# Patient Record
Sex: Female | Born: 1985 | Race: White | Hispanic: No | Smoking: Current every day smoker
Health system: Southern US, Community
[De-identification: ages and names within clinical notes are randomized; demographics above are authoritative.]

## PROBLEM LIST (undated history)

## (undated) DIAGNOSIS — J45909 Unspecified asthma, uncomplicated: Secondary | ICD-10-CM

## (undated) DIAGNOSIS — I2699 Other pulmonary embolism without acute cor pulmonale: Secondary | ICD-10-CM

## (undated) DIAGNOSIS — Q2112 Patent foramen ovale: Secondary | ICD-10-CM

## (undated) DIAGNOSIS — F419 Anxiety disorder, unspecified: Secondary | ICD-10-CM

## (undated) DIAGNOSIS — D649 Anemia, unspecified: Secondary | ICD-10-CM

## (undated) DIAGNOSIS — I82403 Acute embolism and thrombosis of unspecified deep veins of lower extremity, bilateral: Secondary | ICD-10-CM

## (undated) DIAGNOSIS — D6859 Other primary thrombophilia: Secondary | ICD-10-CM

## (undated) HISTORY — PX: PATENT FORAMEN OVALE(PFO) CLOSURE: CATH118300

## (undated) HISTORY — DX: Acute embolism and thrombosis of unspecified deep veins of lower extremity, bilateral: I82.403

## (undated) NOTE — Addendum Note (Signed)
 Formatting of this note is different from the original. Addendum  created 03/09/24 0835 by Ozell KATHEE Naegeli, MD   Clinical Note Signed   Electronically signed by Ozell KATHEE Naegeli, MD at 03/09/2024  8:35 EDT

## (undated) NOTE — Anesthesia Postprocedure Evaluation (Signed)
 Formatting of this note is different from the original. Patient: Amber Carroll Procedures performed: Procedure(s): VENOGRAM/VENOPLASTY/VENUS STENT AND REVCORE THROMBECTOMY  Assessment: No complications from anesthesia  Last vitals:  Vitals Value Taken Time  BP 109/55 01/21/24 19:01  Temp x 01/23/24 08:18  Pulse 62 01/21/24 19:03  Resp 36 01/21/24 19:03  SpO2 100 % 01/21/24 19:02   Post vital signs: stable  Respiratory: unassisted Airway patency: patent Cardiovascular: stable and blood pressure at baseline Mental Status: Awake, Responds verbally, Answers questions appropriately, and Performs simple tasks  Post-anesthesia pain: adequate analgesia  Pain Management Approach: Multimodal analgesia pain management approach  Hydration: euvolemic  Anesthetic complications: no  Nausea: No  Vomiting: No   Electronically signed by Prentice GORMAN Fare, MD at 01/23/2024  8:18 EDT

## (undated) NOTE — Anesthesia Postprocedure Evaluation (Signed)
 Formatting of this note is different from the original. Patient: Amber Carroll Procedures performed: Procedure(s): VENOGRAM/VENOPLASTY/VENUS STENT AND REVCORE THROMBECTOMY  Assessment: No complications from anesthesia  Last vitals:  Vitals Value Taken Time  BP 109/55 01/21/24 19:01  Temp 98.5 03/09/24 08:34  Pulse 62 01/21/24 19:03  Resp 36 01/21/24 19:03  SpO2 100 % 01/21/24 19:02   Post vital signs: stable  Respiratory: unassisted Airway patency: patent Cardiovascular: stable and blood pressure at baseline Mental Status: Awake, Responds verbally, Answers questions appropriately, and Performs simple tasks  Post-anesthesia pain: adequate analgesia  Pain Management Approach: Multimodal analgesia pain management approach  Hydration: euvolemic  Anesthetic complications: no  Nausea: No  Vomiting: No   Electronically signed by Ozell KATHEE Naegeli, MD at 03/09/2024  8:35 EDT

## (undated) NOTE — Anesthesia Preprocedure Evaluation (Signed)
 Formatting of this note is different from the original.  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 Use of blood products discussed with who consented to blood products.    Preoperative Evaluation Electronically Signed and Performed By: Juliene Blossom, MD, 05/18/2024 9:27    Electronically signed by Juliene Blossom, MD at 05/18/2024  9:27 EST

## (undated) NOTE — Anesthesia Preprocedure Evaluation (Signed)
 Formatting of this note is different from the original.  Anesthesia Evaluation   Patient summary reviewed and Nursing notes reviewed   No history of anesthetic complications  Airway  No increased risk of difficult airway Mallampati: II TM distance: >3 FB Neck ROM: full   Dental - normal exam    Pulmonary - normal exam   Cardiovascular - normal exam   Neuro/Psych      GI/Hepatic/Renal     Endo/Other    Abdominal       Anesthesia Plan  ASA 3   general   intravenous induction  Anesthetic plan and risks discussed with patient. Use of blood products discussed with patient who.    Preoperative Evaluation Electronically Signed and Performed By: Tommi Blanch, MD, 01/21/2024 16:06    Electronically signed by Tommi Blanch GAILS, MD at 01/21/2024 16:06 EDT

---

## 2008-04-19 ENCOUNTER — Emergency Department (HOSPITAL_BASED_OUTPATIENT_CLINIC_OR_DEPARTMENT_OTHER): Admission: EM | Admit: 2008-04-19 | Discharge: 2008-04-19 | Payer: Self-pay | Admitting: Emergency Medicine

## 2008-04-20 ENCOUNTER — Ambulatory Visit (HOSPITAL_BASED_OUTPATIENT_CLINIC_OR_DEPARTMENT_OTHER): Admission: RE | Admit: 2008-04-20 | Discharge: 2008-04-20 | Payer: Self-pay | Admitting: Emergency Medicine

## 2008-04-21 ENCOUNTER — Ambulatory Visit: Payer: Self-pay | Admitting: *Deleted

## 2008-04-21 ENCOUNTER — Encounter (INDEPENDENT_AMBULATORY_CARE_PROVIDER_SITE_OTHER): Payer: Self-pay | Admitting: *Deleted

## 2008-04-21 ENCOUNTER — Other Ambulatory Visit: Admission: RE | Admit: 2008-04-21 | Discharge: 2008-04-21 | Payer: Self-pay | Admitting: *Deleted

## 2008-04-21 ENCOUNTER — Ambulatory Visit (HOSPITAL_BASED_OUTPATIENT_CLINIC_OR_DEPARTMENT_OTHER): Admission: RE | Admit: 2008-04-21 | Discharge: 2008-04-21 | Payer: Self-pay | Admitting: *Deleted

## 2008-04-21 DIAGNOSIS — G43009 Migraine without aura, not intractable, without status migrainosus: Secondary | ICD-10-CM | POA: Insufficient documentation

## 2008-04-21 DIAGNOSIS — F329 Major depressive disorder, single episode, unspecified: Secondary | ICD-10-CM | POA: Insufficient documentation

## 2008-04-21 DIAGNOSIS — R109 Unspecified abdominal pain: Secondary | ICD-10-CM | POA: Insufficient documentation

## 2008-04-21 DIAGNOSIS — K219 Gastro-esophageal reflux disease without esophagitis: Secondary | ICD-10-CM | POA: Insufficient documentation

## 2008-04-21 DIAGNOSIS — J45909 Unspecified asthma, uncomplicated: Secondary | ICD-10-CM | POA: Insufficient documentation

## 2008-04-23 ENCOUNTER — Ambulatory Visit: Payer: Self-pay | Admitting: *Deleted

## 2008-04-23 LAB — CONVERTED CEMR LAB
Hep A IgM: NEGATIVE
Hepatitis B Surface Ag: NEGATIVE

## 2008-09-09 ENCOUNTER — Ambulatory Visit: Payer: Self-pay | Admitting: *Deleted

## 2008-09-09 DIAGNOSIS — J309 Allergic rhinitis, unspecified: Secondary | ICD-10-CM | POA: Insufficient documentation

## 2008-10-08 ENCOUNTER — Ambulatory Visit: Payer: Self-pay | Admitting: *Deleted

## 2008-10-08 DIAGNOSIS — N926 Irregular menstruation, unspecified: Secondary | ICD-10-CM | POA: Insufficient documentation

## 2009-01-30 DIAGNOSIS — I2699 Other pulmonary embolism without acute cor pulmonale: Secondary | ICD-10-CM

## 2009-01-30 HISTORY — DX: Other pulmonary embolism without acute cor pulmonale: I26.99

## 2009-02-03 ENCOUNTER — Telehealth: Payer: Self-pay | Admitting: Family Medicine

## 2009-02-07 ENCOUNTER — Ambulatory Visit: Payer: Self-pay | Admitting: Internal Medicine

## 2009-02-07 ENCOUNTER — Emergency Department (HOSPITAL_COMMUNITY): Admission: EM | Admit: 2009-02-07 | Discharge: 2009-02-07 | Payer: Self-pay | Admitting: Family Medicine

## 2009-02-07 DIAGNOSIS — Z86718 Personal history of other venous thrombosis and embolism: Secondary | ICD-10-CM | POA: Insufficient documentation

## 2009-02-08 ENCOUNTER — Ambulatory Visit: Payer: Self-pay | Admitting: *Deleted

## 2009-02-08 ENCOUNTER — Inpatient Hospital Stay (HOSPITAL_COMMUNITY): Admission: EM | Admit: 2009-02-08 | Discharge: 2009-02-10 | Payer: Self-pay | Admitting: Emergency Medicine

## 2009-02-08 ENCOUNTER — Encounter (INDEPENDENT_AMBULATORY_CARE_PROVIDER_SITE_OTHER): Payer: Self-pay | Admitting: Emergency Medicine

## 2009-02-11 ENCOUNTER — Ambulatory Visit: Payer: Self-pay | Admitting: Oncology

## 2009-02-11 ENCOUNTER — Ambulatory Visit: Payer: Self-pay | Admitting: Cardiology

## 2009-02-11 DIAGNOSIS — I2699 Other pulmonary embolism without acute cor pulmonale: Secondary | ICD-10-CM | POA: Insufficient documentation

## 2009-02-11 LAB — CONVERTED CEMR LAB
INR: 8.8 (ref 0.8–1.0)
POC INR: 8.8
Prothrombin Time: 90 s

## 2009-02-14 ENCOUNTER — Ambulatory Visit: Payer: Self-pay | Admitting: Cardiovascular Disease

## 2009-02-14 ENCOUNTER — Encounter: Payer: Self-pay | Admitting: Cardiovascular Disease

## 2009-02-14 ENCOUNTER — Telehealth (INDEPENDENT_AMBULATORY_CARE_PROVIDER_SITE_OTHER): Payer: Self-pay | Admitting: *Deleted

## 2009-02-14 LAB — CONVERTED CEMR LAB
INR: 2 — ABNORMAL HIGH (ref 0.8–1.0)
Prothrombin Time: 20.8 s

## 2009-02-16 ENCOUNTER — Telehealth (INDEPENDENT_AMBULATORY_CARE_PROVIDER_SITE_OTHER): Payer: Self-pay | Admitting: *Deleted

## 2009-02-18 ENCOUNTER — Encounter: Payer: Self-pay | Admitting: Cardiology

## 2009-02-18 ENCOUNTER — Ambulatory Visit: Payer: Self-pay | Admitting: Internal Medicine

## 2009-02-18 LAB — CONVERTED CEMR LAB
INR: 2.1 — ABNORMAL HIGH (ref 0.8–1.0)
Prothrombin Time: 21.2 s

## 2009-02-23 ENCOUNTER — Ambulatory Visit: Payer: Self-pay | Admitting: Cardiovascular Disease

## 2009-02-23 ENCOUNTER — Encounter: Payer: Self-pay | Admitting: Internal Medicine

## 2009-02-23 LAB — CBC & DIFF AND RETIC
BASO%: 1.7 % (ref 0.0–2.0)
Basophils Absolute: 0.1 10*3/uL (ref 0.0–0.1)
HCT: 38.4 % (ref 34.8–46.6)
HGB: 12.3 g/dL (ref 11.6–15.9)
Immature Retic Fract: 2.9 % (ref 0.00–10.70)
MONO#: 0.3 10*3/uL (ref 0.1–0.9)
NEUT#: 2.9 10*3/uL (ref 1.5–6.5)
NEUT%: 56.5 % (ref 38.4–76.8)
WBC: 5.2 10*3/uL (ref 3.9–10.3)
lymph#: 1.7 10*3/uL (ref 0.9–3.3)

## 2009-02-23 LAB — FACTOR 8 ASSAY: Coagulation Factor VIII: 199 % — ABNORMAL HIGH (ref 73–140)

## 2009-02-23 LAB — PROTIME-INR

## 2009-02-23 LAB — CONVERTED CEMR LAB: POC INR: 1.5

## 2009-02-24 ENCOUNTER — Telehealth: Payer: Self-pay | Admitting: Internal Medicine

## 2009-02-28 ENCOUNTER — Ambulatory Visit: Payer: Self-pay | Admitting: Cardiology

## 2009-02-28 LAB — CONVERTED CEMR LAB: POC INR: 1.9

## 2009-03-02 ENCOUNTER — Ambulatory Visit: Payer: Self-pay | Admitting: Cardiology

## 2009-03-02 ENCOUNTER — Encounter: Payer: Self-pay | Admitting: Pharmacist

## 2009-03-04 ENCOUNTER — Ambulatory Visit: Payer: Self-pay | Admitting: Internal Medicine

## 2009-03-09 ENCOUNTER — Ambulatory Visit: Payer: Self-pay | Admitting: Internal Medicine

## 2009-03-09 LAB — CONVERTED CEMR LAB: POC INR: 2.7

## 2009-03-16 ENCOUNTER — Ambulatory Visit: Payer: Self-pay | Admitting: Cardiology

## 2009-03-16 LAB — CONVERTED CEMR LAB: POC INR: 2.1

## 2009-03-23 ENCOUNTER — Ambulatory Visit: Payer: Self-pay | Admitting: Vascular Surgery

## 2009-03-23 ENCOUNTER — Emergency Department (HOSPITAL_COMMUNITY): Admission: EM | Admit: 2009-03-23 | Discharge: 2009-03-23 | Payer: Self-pay | Admitting: Emergency Medicine

## 2009-03-23 ENCOUNTER — Ambulatory Visit: Payer: Self-pay | Admitting: Cardiovascular Disease

## 2009-03-23 LAB — CONVERTED CEMR LAB: POC INR: 1.4

## 2009-03-24 ENCOUNTER — Ambulatory Visit (HOSPITAL_COMMUNITY): Admission: RE | Admit: 2009-03-24 | Discharge: 2009-03-24 | Payer: Self-pay | Admitting: Emergency Medicine

## 2009-03-24 ENCOUNTER — Encounter (INDEPENDENT_AMBULATORY_CARE_PROVIDER_SITE_OTHER): Payer: Self-pay | Admitting: Emergency Medicine

## 2009-03-28 ENCOUNTER — Emergency Department (HOSPITAL_COMMUNITY): Admission: EM | Admit: 2009-03-28 | Discharge: 2009-03-28 | Payer: Self-pay | Admitting: Emergency Medicine

## 2009-03-28 ENCOUNTER — Ambulatory Visit: Payer: Self-pay | Admitting: Cardiology

## 2009-04-04 ENCOUNTER — Ambulatory Visit: Payer: Self-pay | Admitting: Internal Medicine

## 2009-04-06 ENCOUNTER — Ambulatory Visit: Payer: Self-pay | Admitting: Internal Medicine

## 2009-04-06 DIAGNOSIS — I2699 Other pulmonary embolism without acute cor pulmonale: Secondary | ICD-10-CM | POA: Insufficient documentation

## 2009-04-06 DIAGNOSIS — R0602 Shortness of breath: Secondary | ICD-10-CM | POA: Insufficient documentation

## 2009-04-12 ENCOUNTER — Ambulatory Visit: Payer: Self-pay | Admitting: Cardiology

## 2009-04-22 ENCOUNTER — Ambulatory Visit: Payer: Self-pay | Admitting: Cardiology

## 2009-04-22 LAB — CONVERTED CEMR LAB: POC INR: 2.4

## 2009-05-06 ENCOUNTER — Ambulatory Visit: Payer: Self-pay | Admitting: Cardiovascular Disease

## 2009-05-06 LAB — CONVERTED CEMR LAB: POC INR: 4.5

## 2009-05-13 ENCOUNTER — Ambulatory Visit: Payer: Self-pay | Admitting: Oncology

## 2009-05-17 ENCOUNTER — Encounter: Payer: Self-pay | Admitting: Internal Medicine

## 2009-05-17 ENCOUNTER — Encounter (INDEPENDENT_AMBULATORY_CARE_PROVIDER_SITE_OTHER): Payer: Self-pay | Admitting: Pharmacist

## 2009-05-17 ENCOUNTER — Encounter: Payer: Self-pay | Admitting: Cardiology

## 2009-05-17 LAB — CBC WITH DIFFERENTIAL/PLATELET
BASO%: 0.4 % (ref 0.0–2.0)
Basophils Absolute: 0 10*3/uL (ref 0.0–0.1)
EOS%: 2.3 % (ref 0.0–7.0)
HGB: 11.3 g/dL — ABNORMAL LOW (ref 11.6–15.9)
MCH: 28.5 pg (ref 25.1–34.0)
MCHC: 32.4 g/dL (ref 31.5–36.0)
MCV: 87.9 fL (ref 79.5–101.0)
MONO%: 7 % (ref 0.0–14.0)
NEUT%: 60.4 % (ref 38.4–76.8)
RDW: 15.1 % — ABNORMAL HIGH (ref 11.2–14.5)

## 2009-05-17 LAB — PROTIME-INR
INR: 1.6 — ABNORMAL LOW (ref 2.00–3.50)
Protime: 19.2 Seconds — ABNORMAL HIGH (ref 10.6–13.4)

## 2009-05-31 ENCOUNTER — Emergency Department (HOSPITAL_COMMUNITY): Admission: EM | Admit: 2009-05-31 | Discharge: 2009-06-01 | Payer: Self-pay | Admitting: Emergency Medicine

## 2009-05-31 ENCOUNTER — Emergency Department (HOSPITAL_COMMUNITY): Admission: EM | Admit: 2009-05-31 | Discharge: 2009-05-31 | Payer: Self-pay | Admitting: Emergency Medicine

## 2009-06-17 ENCOUNTER — Encounter (INDEPENDENT_AMBULATORY_CARE_PROVIDER_SITE_OTHER): Payer: Self-pay | Admitting: Cardiology

## 2009-07-14 ENCOUNTER — Telehealth: Payer: Self-pay | Admitting: Internal Medicine

## 2009-07-28 ENCOUNTER — Encounter (INDEPENDENT_AMBULATORY_CARE_PROVIDER_SITE_OTHER): Payer: Self-pay | Admitting: Cardiology

## 2009-08-05 ENCOUNTER — Ambulatory Visit: Payer: Self-pay | Admitting: Internal Medicine

## 2009-08-05 DIAGNOSIS — F17200 Nicotine dependence, unspecified, uncomplicated: Secondary | ICD-10-CM | POA: Insufficient documentation

## 2009-08-24 ENCOUNTER — Encounter: Payer: Self-pay | Admitting: Internal Medicine

## 2009-08-24 ENCOUNTER — Ambulatory Visit: Payer: Self-pay | Admitting: Oncology

## 2009-08-26 ENCOUNTER — Encounter: Payer: Self-pay | Admitting: Internal Medicine

## 2009-08-26 LAB — CBC WITH DIFFERENTIAL/PLATELET
Basophils Absolute: 0 10*3/uL (ref 0.0–0.1)
Eosinophils Absolute: 0.2 10*3/uL (ref 0.0–0.5)
HGB: 12.5 g/dL (ref 11.6–15.9)
LYMPH%: 38.9 % (ref 14.0–49.7)
MCV: 88.8 fL (ref 79.5–101.0)
MONO#: 0.3 10*3/uL (ref 0.1–0.9)
MONO%: 6.4 % (ref 0.0–14.0)
NEUT#: 2 10*3/uL (ref 1.5–6.5)
Platelets: 184 10*3/uL (ref 145–400)
WBC: 3.9 10*3/uL (ref 3.9–10.3)

## 2009-09-01 LAB — PROTEIN S ACTIVITY: Protein S Activity: 82 % (ref 69–129)

## 2009-09-01 LAB — PROTEIN S, ANTIGEN, FREE: Protein S Ag, Free: 75 % normal (ref 50–147)

## 2009-09-01 LAB — D-DIMER, QUANTITATIVE: D-Dimer, Quant: 0.26 ug/mL-FEU (ref 0.00–0.48)

## 2010-01-19 IMAGING — CR DG CHEST 2V
2 series · 2 of 2 positions shown · non-contrast
Comparison: 02/09/2009 and earlier.

CLINICAL DATA: 23-year-old female with shortness of breath and
pleuritic chest pain. History of pulmonary embolus and an January 2009.

CHEST - 2 VIEW

[w chest pa]
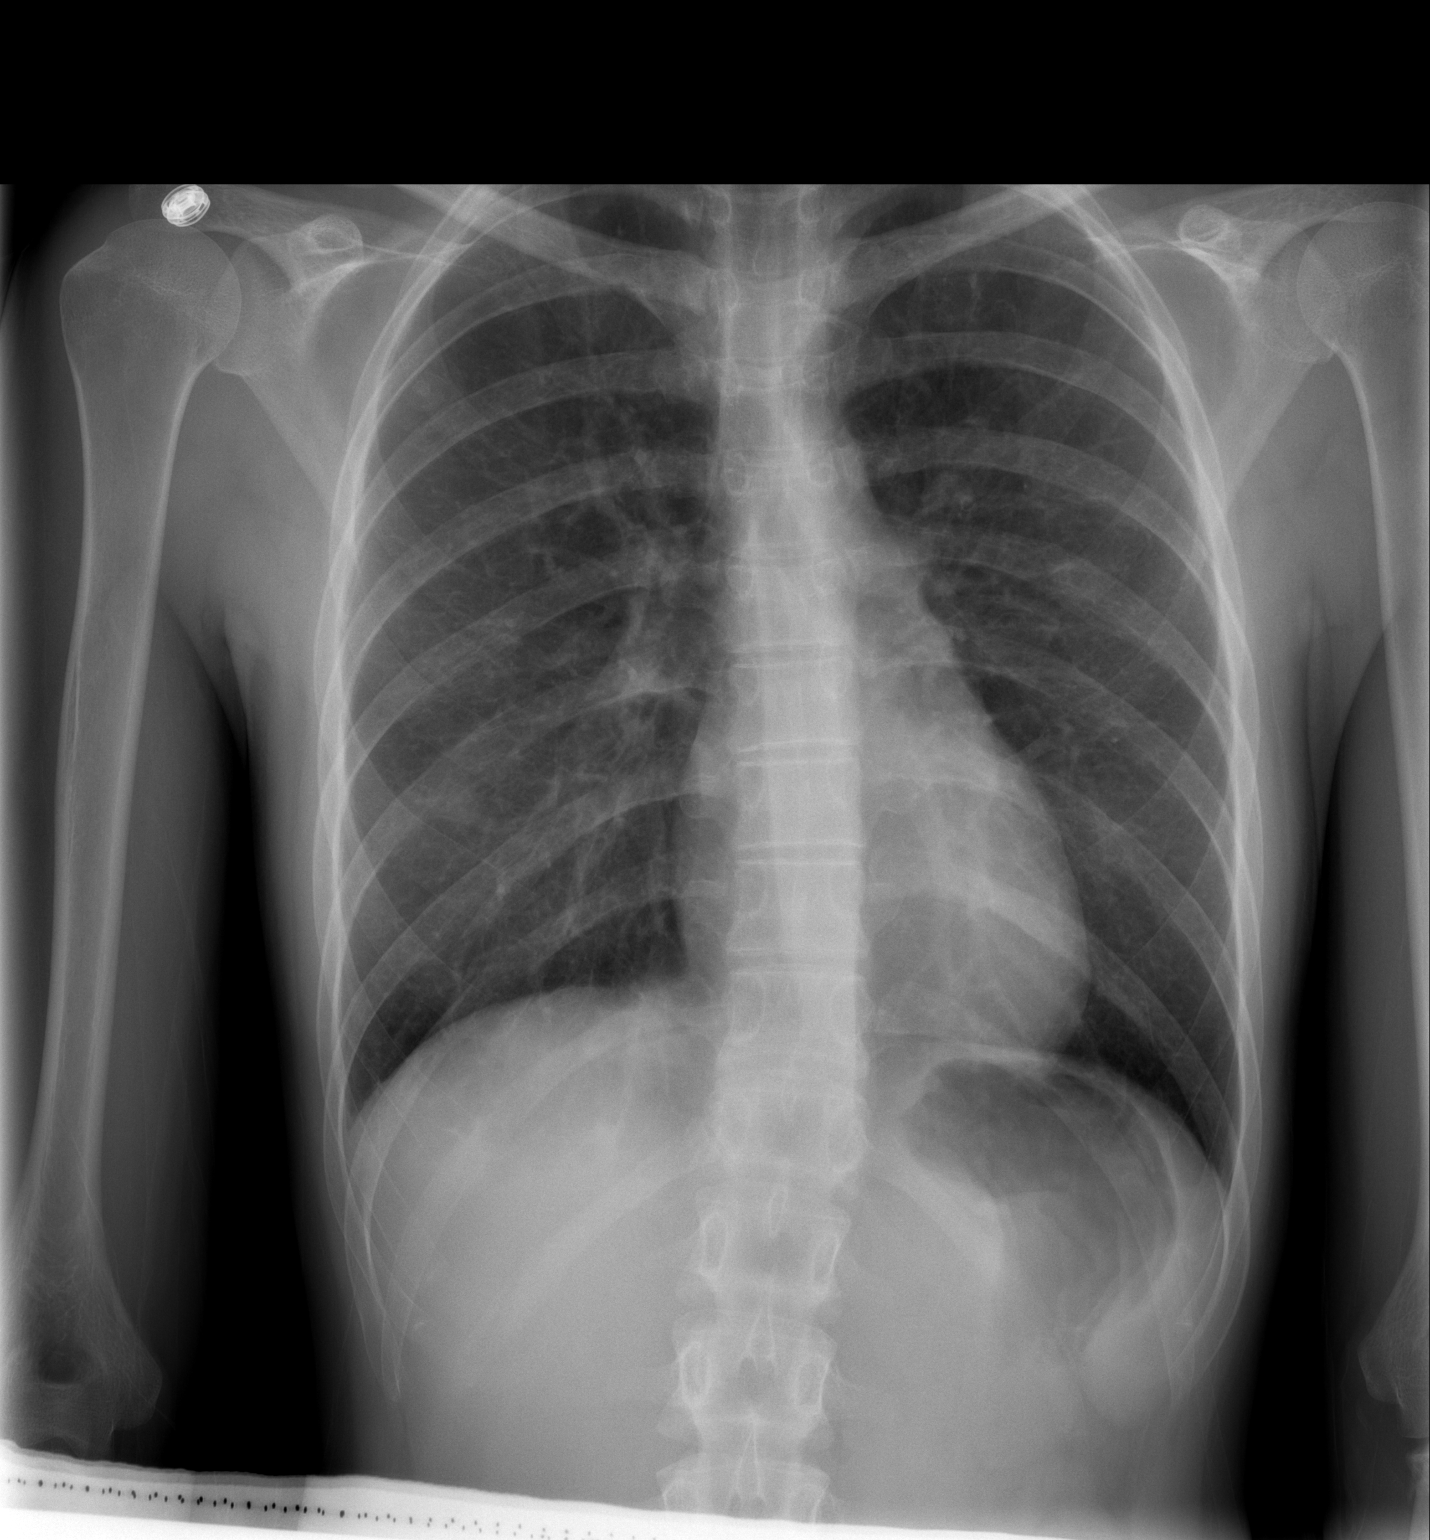

[w chest lat]
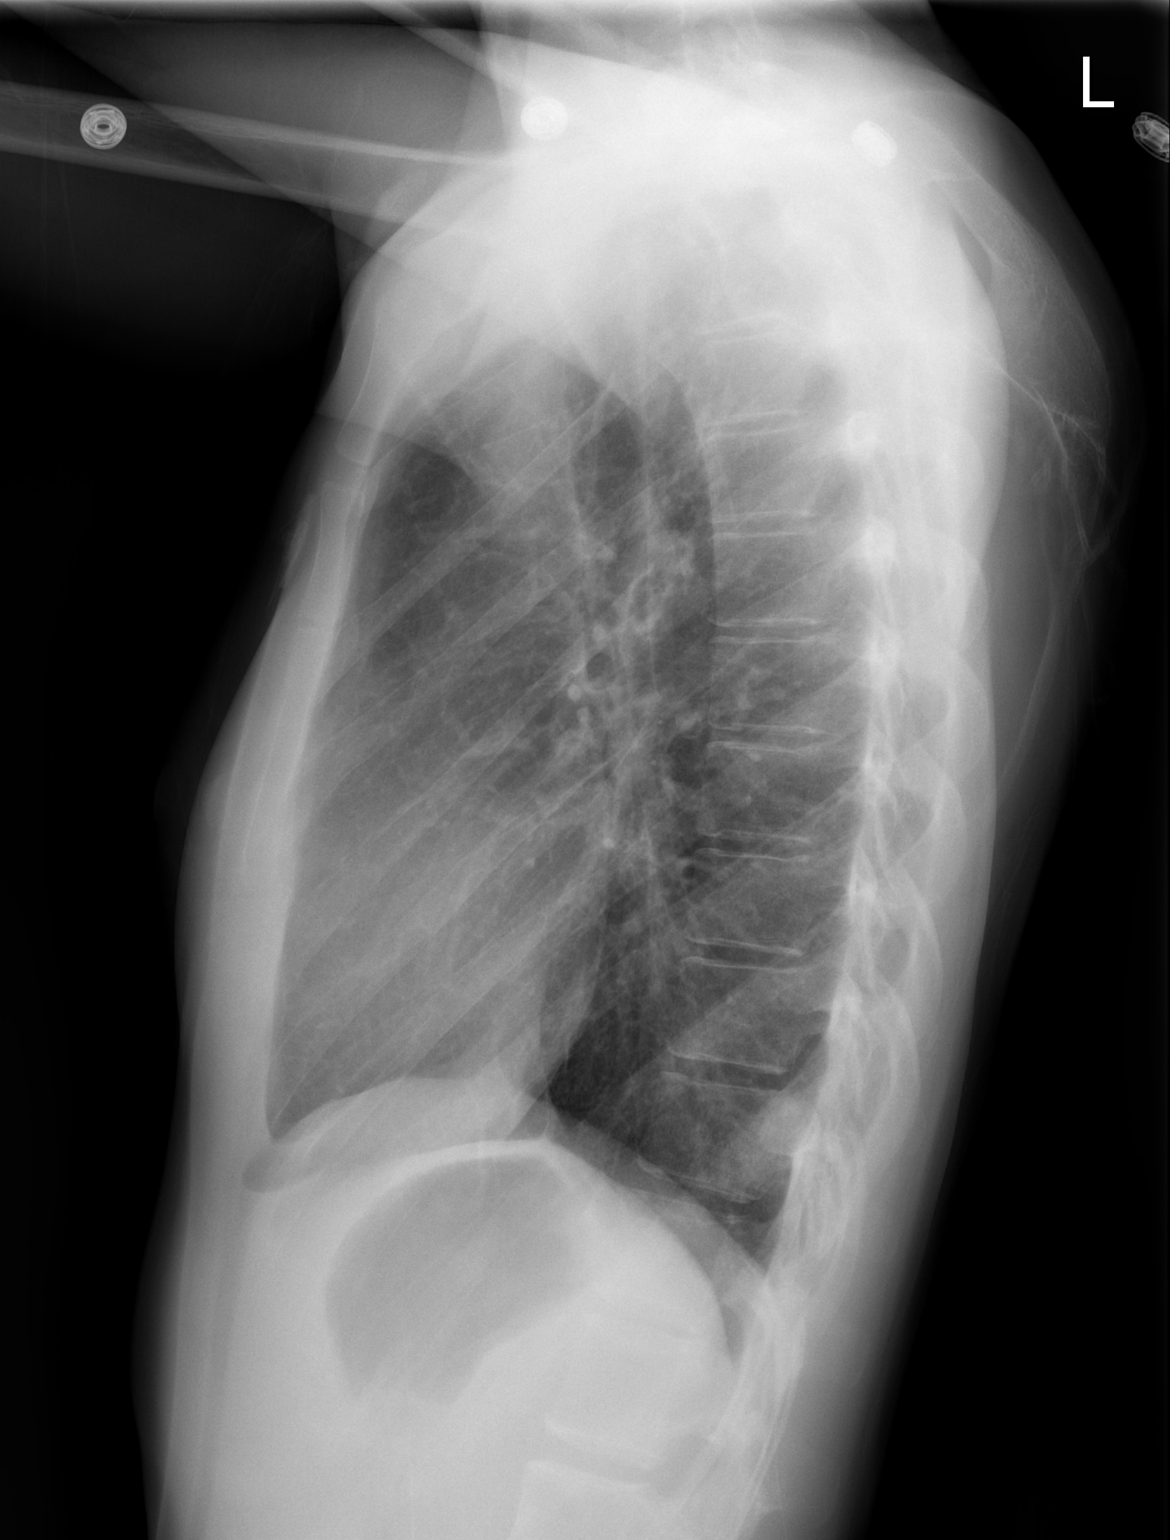

[2 of 2 positions shown; findings below may reference images not displayed]

FINDINGS: Better lung volumes and interval improved ventilation at
both lung bases.  Stable cardiac size and mediastinal contours.  No
pneumothorax or pleural effusion.  No pulmonary edema. Stable
visualized osseous structures.  Visualized tracheal air column is
within normal limits.
IMPRESSION: Improved ventilation at the lung bases.  No acute cardiopulmonary
abnormality by plain radiography. History of recent pulmonary
embolus noted, chest CTA is pending at this time.

## 2010-08-01 NOTE — Assessment & Plan Note (Signed)
Summary: rov/ mbw   Visit Type:  Follow-up Primary Provider/Referring Provider:  None  CC:  Pt here to discuss stopping coumadin. 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 Course was complicated by rt pleuritic pain due to infarct and anemia nos (hg 8-9gm%). She now presents for followup. After discharge she had 1 visit to [facility] on day of discharge for severe Rt infrascapular pleuritic pain. She was discharged with NSAIDs with the opinion this pain was from the infarct. Since then pain improved. However, repprested to [place] on 03/23/2009 with Lower extremitiy pain. Doppler negative for PE and discharged. RE-presented again 03/28/2009 with rt back pain. Had CT angiogram again on 03/28/2009. I personally reviewed this and compared to CT 02/07/2009. THe current CT shows that PE has improved but still has residual peripheral clot and Rt LL infarct findings. Pain was deemed to UTI and she was given cipro and discharged. With cipro pain has improved significantly.  However, she still has some Rt sided chest tightness occassionally with some rt shoulder pain. Also, activities like climibing stairs produce mild dyspnea. She is not smoking anymore and now states that she quit smoking years before PE ( a variation in her hx compared to admission when she told me that she continued to smoke occassionally). She denies illicit drug abuse. She is not using contraception. Admist to sexual activity - ? protected, ? monogamous. She is wanting to know what implications are there for her if she were to get pregnant. She still commutes to [place] every month or so to see her daughter on the weekends.  In terms Of PE, she saw Dr. Sheldon Silvan and will see him again in few months. She understands need for 1-4 vs lifelong anticoagulation. She is compliant with coumadin clinic but INR has  fluctuated 1.7-.2.7 per her report.   REC: Continued coumadinOV 08/05/2009. Followup PE. Since last visit she discontinued coumadin. She no showed at coumadin clinic and has refused to take coumadin. We contacted her and she cancelled and finally showed up today. SHe is feeling well. Denies complaints. Denies dyspnea, chest pain, syncope, hemoptysis, paroxysmal nocturnal dyspnea, fever, chills, dyspnea etc., She is refusing to take coumadin. Her rationale is that she wants to be tested for Protein S deficiency off coumadin and if that is present, then she will take it life long. Otherwise, she sees no point taking it. Continues to smoke. REfuses to quit. Says it helps stress  Current Medications (verified): 1)  Ventolin Hfa 108 (90 Base) Mcg/act Aers (Albuterol Sulfate) .Marland Kitchen.. 1-2 Puffs Every 3-4 Hours As Needed Shortness of Breath  Allergies (verified): 1)  ! * Latex  Past History:  Past Medical History: Last updated: 04/06/2009 [facility] EMBOLISM WITHOUT DVT - 02/07/2009 > CT 02/07/09 large occlusive pulmonary embolus extending from the right main PA primarily into branches of the pulmonary artery to the  right lower lobe and right middle lobe with atelectasis/ developing  infarction particularly  right lower lobe >SEVERITY: PESI SCORE 42/Class 1, needed 2L O2. SBP 90-100 not tpa'ed this was baseline >etiology: smoking, fam hx, nuva ring, recent knee strain, travel to [place] via care >hyperocoag panel 02/08/09 #Reported asthma, details are completely unknown. Uses only      albuterol as needed # Strained right knee towards the end of June, early July, 2010 no  specific treatment.  #Status post placement of NuvaRing in June, early July 2010  #Teeth work done 2 weeks prior to PE  admission in August 2010 #Hx of chlamydia, gonorrhea - date unspecified #Pyelonephritis in the 2008.  #Baseline low bloodpressure 80-90/60 per her dad. This is because of      her thin status  #Thin status - denuies  anorexia or bulemia  #Depression - post partum in 2008, & depression in 2004 used meds for one year, not an issue at present #Migraine - tylenol or ibuprofen relieves the headaches #GERD  Past Surgical History: Last updated: 04/21/2008 Denies surgical history  Family History: Last updated: 04/06/2009 Family History of Alcoholism/Addiction Family History Breast cancer  Family History of Stroke    Positive for deep vein thrombosis.  Her father has a   factor V Leiden deficiency.  Her sister suffered a right lower extremity  DVT and inferior vena cava occlusion at age 81.  The sister is currently  age 24.  She did not have a PE at that time.  The father was tested and  the sister both were confirmed to have factor V Leiden deficiency.  The  patient herself and her other younger sibling were also tested and did not show to have factor V Leiden deficiency.  Although her mom had a  massive stroke at age 72 and I think is disabled currently.  UPDATE  04/06/2009: Dad Protein S - normal. Mom getting tested for it  Social History: Last updated: 04/06/2009 She is a nondrinker.  She used to live in [place], [place], but she moved with her family to [place].   She lives with her dad and step mom.  Her biological mom suffered a   massive stroke and lives in [place].  There is no history of any specific drug abuse    although medicines in august 2010 do list some Darvocet and Naprosyn use at home , I am   unclear why and for how long.  She is engaged in a custody battle with  her ex-husband or boyfriend.  There have a 71-1/2-year-old daughter who  is currently in [place] with the biological dad  Denies smoking history but in hospital 8/10 PE admission she admitted to smoking. Was very difficult to get smoking details. Was underplaying smoking hx  Risk Factors: Caffeine Use: 4 - recommended cutting back (04/21/2008) Exercise: yes (04/21/2008)  Risk Factors: Smoking Status:  never (04/21/2008) Passive Smoke Exposure: no (04/21/2008)  Family History: Reviewed history from 04/06/2009 and no changes required. Family History of Alcoholism/Addiction Family History Breast cancer  Family History of Stroke    Positive for deep vein thrombosis.  Her father has a   factor V Leiden deficiency.  Her sister suffered a right lower extremity  DVT and inferior vena cava occlusion at age 54.  The sister is currently  age 33.  She did not have a PE at that time.  The father was tested and  the sister both were confirmed to have factor V Leiden deficiency.  The  patient herself and her other younger sibling were also tested and did not show to have factor V Leiden deficiency.  Although her mom had a  massive stroke at age 16 and I think is disabled currently.  UPDATE  04/06/2009: Dad Protein S - normal. Mom getting tested for it  Social History: Reviewed history from 04/06/2009 and no changes required. She is a nondrinker.  She used to live  in [place], [place], but she moved with her family to [place].   She lives with her dad and step mom.  Her biological mom suffered a   massive stroke and lives in [place].  There is no history of any specific drug abuse    although medicines in august 2010 do list some Darvocet and Naprosyn use at home , I am   unclear why and for how long.  She is engaged in a custody battle with  her ex-husband or boyfriend.  There have a 41-1/2-year-old daughter who  is currently in [place] with the biological dad  Denies smoking history but in hospital 8/10 PE admission she admitted to smoking. Was very difficult to get smoking details. Was underplaying smoking hx  Review of Systems  The patient denies anorexia, fever, weight loss, weight gain, vision loss, decreased hearing, hoarseness, chest pain, syncope, dyspnea on exertion, peripheral edema, prolonged cough, headaches, hemoptysis, abdominal pain, melena, hematochezia, severe  indigestion/heartburn, hematuria, incontinence, genital sores, muscle weakness, suspicious skin lesions, transient blindness, difficulty walking, depression, unusual weight change, abnormal bleeding, enlarged lymph nodes, angioedema, breast masses, and testicular masses.    Vital Signs:  Patient profile:   25 year old female Menstrual status:  irregular Height:      63 inches Weight:      95 pounds BMI:     16.89 O2 Sat:      97 % on Room air Temp:     97.9 degrees F oral Pulse rate:   57 / minute BP sitting:   100 / 68  (right arm) Cuff size:   regular  Vitals Entered By: Carron Curie CMA (August 05, 2009 1:37 PM)  O2 Flow:  Room air CC: Pt here to discuss stopping coumadin.  Comments Medications reviewed with patient Carron Curie CMA  August 05, 2009 1:40 PM Daytime phone number verified with patient.    Physical Exam  General:  well developed, well nourished, in no acute distress. thin.   Head:  normocephalic and atraumatic. Mild alpeica Eyes:  PERRLA/EOM intact; conjunctiva and sclera clear Ears:  TMs intact and clear with normal canals Nose:  no deformity, discharge, inflammation, or lesions Mouth:  no deformity or lesions Neck:  no masses, thyromegaly, or abnormal cervical nodes Chest Wall:  no deformities noted Lungs:  clear bilaterally to auscultation and percussion Heart:  regular rate and rhythm, S1, S2 without murmurs, rubs, gallops, or clicks Abdomen:  bowel sounds positive; abdomen soft and non-tender without masses, or organomegaly Msk:  no deformity or scoliosis noted with normal posture Pulses:  pulses normal Extremities:  no clubbing, cyanosis, edema, or deformity noted Neurologic:  CN II-XII grossly intact with normal reflexes, coordination, muscle strength and tone Skin:  intact without lesions or rashes Cervical Nodes:  no significant adenopathy Axillary Nodes:  no significant adenopathy Psych:  alert and cooperative; normal mood and  affect; normal attention span and concentration   Impression & Recommendations:  Problem # 1:  PULMONARY EMBOLISM (ICD-415.1) Assessment Unchanged She is refusing coumadin. Has not taken it past 2-3 months. I explained to her even in idiopathic PE the rationale to take coumadin for 6-12 months is to prevent pulmonary hypertension chronically and prevent 2nd PE which would automatically mean life long coumaind. She runs the risk of death from 2nd PE. Explained pulmonary hypertension and PE are very serious illnesses where she can be very ill and suffere high morbidity and mortality. She understands this but  is just not interested in coumadin. Her response, " I do not want to take pills". SHe is willing to see Dr. Sheldon Silvan to test for genetic causes off coumadin. So, I made a referral. She is NOT intterested in any furhter followup. I have therefore discharged from routine followup but have indicated that she can make an appoointment anytime she wished  15 moinutes counselling  The following medications were removed from the medication list:    Warfarin Sodium 2 Mg Tabs (Warfarin sodium) ..... Use as directed by anticoagualtion clinic  Orders: Hematology Referral (Hematology) Est. Patient Level III ([address]) Tobacco use cessation intermediate 3-10 minutes (60454)  Problem # 2:  SHORTNESS OF BREATH (SOB) (ICD-786.05) Assessment: Unchanged still smokes. explained it increases risk of pssibly PE and other diseases. She says it helps her stress levels. Refused to quit  3 minute counselling time   Patient Instructions: 1)  Please meet iwth Dr. Cyndie Chime for blood clot genetic issues 2)  REturn to [place] when you are interested in taking coumadin or have new problems  Appended Document: rov/ mbw copy of email conversation from 08/08/2009  Carmin Muskrat - you should have received my office consultation from 02/23/09 and a follow up note from 11/16.10.  This is a very immature woman who doesn't  understand that she had a life threatening blood clot at a very young age and needs to be on full anticoagulation for at least a year and probably longer given her family history.  She already has a scheduled follow up visit with [place] on 2/15 which I doubt she will keep.  If she is currently off coumadin, we can repeat the protein S studies.  We referred her to the [facility] clinic and did receive a note that she kept her appointment there.  She felt the coumadin was causing her headaches which I seriously doubt. Lowella Dandy  ________________________________  FromKalman Shan Sent: Monday, August 08, 2009 9:10 AM To: Cephas Darby Subject: Amber Carroll   Hi Fonnie Jarvis. I sent a referral to you by mistake because this patient dob 1986/01/13 told me she had not seen you. She is refusing coumadin unless Protein S deficiency has been documented. After detailed conversation she still refused. I referred her back to you based on the information she gave me that etiological workup was not complete. I have discharged her from active followup but kept my doors open any time she needs our services.   FYI   Thanks   [employer]

## 2010-08-01 NOTE — Letter (Signed)
[facility]   Imported By: Lester Hughes 09/21/2009 09:02:07  _____________________________________________________________________  External Attachment:    Type:   Image     Comment:   External Document

## 2010-08-01 NOTE — Progress Notes (Signed)
Summary: Pt discontinued coumadin-ATC  Phone Note Outgoing Call Call back at [phone]   Call placed by: Cloyde Reams RN,  July 14, 2009 4:36 PM Call placed to: Patient Summary of Call: Called patient to schedule past due coumadin follow-up appt.  Pt states she took herself off coumadin.  She states she stopped because she kept "forgetting" to take it, and she lost her job and has no insurance at the present time.  Pt has hx of PE and was consuled on the dangers of discontinuing this medication.  Pt states she is not concerned about recurrent PE.   Initial call taken by: Cloyde Reams RN,  July 14, 2009 4:36 PM  Follow-up for Phone Call        FYI. Carron Curie CMA  July 18, 2009 2:35 PM   Additional Follow-up for Phone Call Additional follow up Details #1::        Gosh. This is downright dangerous behavior on part of patient. She is placing herself at high risk for death, and recurrent PE. Please schedule an appt with me so I can counsel her. Document all conversation with patient in detail Additional Follow-up by: Kalman Shan MD,  July 20, 2009 10:29 PM    Additional Follow-up for Phone Call Additional follow up Details #2::    ATC pt but unable to reach, no answer, no voicemail. Will retry later. Carron Curie CMA  July 21, 2009 4:55 PM  Spoke with patient and advised her that just stopping coumadin was very dangerous for her health, and she was a t high risk for recurrent PE and even death. Advised pt we needed to get her in to see MR to discuss thia. Pt states she lost her job and does not have anyinsurance. I advised that was not a problem, that OV could be billed and she could call Customer Service and discuss with them future payment options. Pt schedueld to see MR on 08/03/09 at 1:30.  Advised pt it was very important to keep this appt. Pt stated understanding. Carron Curie CMA  July 22, 2009 10:16 AM

## 2010-08-01 NOTE — Letter (Signed)
[facility]   Imported By: Lester [place] 08/12/2009 09:16:50  _____________________________________________________________________  External Attachment:    Type:   Image     Comment:   External Document

## 2010-08-01 NOTE — Letter (Signed)
Summary: [facility]  Coumadin  [address]   [place], [place] [address]   Phone: [phone]  Fax: [phone]     July 28, 2009 MRN: [id number]   [facility] [address] [place], [place]  [address]   Dear Ms. Charnley,  We have attempted to contact you by phone and letter on multiple occasions to contact our office for important blood work associated with the blood thinner, warfarin (Coumadin).  Warfarin is a very important drug that can cause life threatening side effects including, bleeding, and thus requires close laboratory monitoring.  We are unable to accept responsibility for blood thinner-related health problems you may develop because you have not followed our recommendations for appropriate monitoring.  These may include abnormal bleeding occurrences and/or development of blood clots (stroke, heart attack, blood clots in legs or lungs, etc.).  We need for you to contact this office at the number listed above to schedule and complete this very important blood work.  Thank you for your assistance in this urgent matter.  Sincerely,  [facility]ovascular [facility] Reduction [facility] Team

## 2010-08-01 NOTE — Medication Information (Signed)
Summary: Coumadin Clinic  Anticoagulant Therapy  Managed by: Inactive Referring MD: Kalman Shan, MD PCP: None Supervising MD: Ladona Ridgel MD, Sharlot Gowda Indication 1: Pulmonary Embolism Lab Used: [facility] [place] Site: [address] INR RANGE 2-3          Comments: Per last OV note with Dr Marchelle Gearing pt is off coumadin aqainst medical advice. Cloyde Reams RN  August 24, 2009 4:57 PM   Allergies: 1)  ! * Latex  Anticoagulation Management History:      Negative risk factors for bleeding include an age less than 46 years old.  The bleeding index is 'low risk'.  Negative CHADS2 values include Age > 69 years old.  Her last INR was 2.1 ratio.  Anticoagulation responsible provider: Ladona Ridgel MD, Sharlot Gowda.  Exp: 04/2010.    Anticoagulation Management Assessment/Plan:      The target INR is 2.0-3.0.  The next INR is due 05/25/2009.  Anticoagulation instructions were given to patient.  Results were reviewed/authorized by Inactive.         Prior Anticoagulation Instructions: INR 1.6 Pt. to resume 2mg s daily except 3mg s on Wednesdays. Recheck in one week.

## 2010-10-04 LAB — PROTIME-INR: INR: 0.99 (ref 0.00–1.49)

## 2010-10-06 LAB — DIFFERENTIAL
Basophils Absolute: 0.1 10*3/uL (ref 0.0–0.1)
Basophils Relative: 1 % (ref 0–1)
Monocytes Absolute: 0.4 10*3/uL (ref 0.1–1.0)
Neutro Abs: 3.7 10*3/uL (ref 1.7–7.7)
Neutrophils Relative %: 56 % (ref 43–77)

## 2010-10-06 LAB — CBC
MCHC: 33.6 g/dL (ref 30.0–36.0)
RDW: 14.6 % (ref 11.5–15.5)

## 2010-10-06 LAB — POCT I-STAT, CHEM 8
BUN: 11 mg/dL (ref 6–23)
Calcium, Ion: 1.13 mmol/L (ref 1.12–1.32)
Glucose, Bld: 95 mg/dL (ref 70–99)
TCO2: 24 mmol/L (ref 0–100)

## 2010-10-06 LAB — URINALYSIS, ROUTINE W REFLEX MICROSCOPIC
Nitrite: NEGATIVE
Specific Gravity, Urine: 1.01 (ref 1.005–1.030)
pH: 7 (ref 5.0–8.0)

## 2010-10-06 LAB — URINE MICROSCOPIC-ADD ON

## 2010-10-06 LAB — POCT PREGNANCY, URINE: Preg Test, Ur: NEGATIVE

## 2010-10-06 LAB — PROTIME-INR
INR: 1.9 — ABNORMAL HIGH (ref 0.00–1.49)
Prothrombin Time: 21.8 seconds — ABNORMAL HIGH (ref 11.6–15.2)

## 2010-10-06 LAB — APTT: aPTT: 27 seconds (ref 24–37)

## 2010-10-08 LAB — CBC
HCT: 28.6 % — ABNORMAL LOW (ref 36.0–46.0)
HCT: 37.3 % (ref 36.0–46.0)
Hemoglobin: 10.5 g/dL — ABNORMAL LOW (ref 12.0–15.0)
MCHC: 32.7 g/dL (ref 30.0–36.0)
MCHC: 32.9 g/dL (ref 30.0–36.0)
MCHC: 33.4 g/dL (ref 30.0–36.0)
MCV: 90.3 fL (ref 78.0–100.0)
Platelets: 113 10*3/uL — ABNORMAL LOW (ref 150–400)
Platelets: 113 10*3/uL — ABNORMAL LOW (ref 150–400)
Platelets: 127 10*3/uL — ABNORMAL LOW (ref 150–400)
Platelets: 156 10*3/uL (ref 150–400)
RBC: 2.92 MIL/uL — ABNORMAL LOW (ref 3.87–5.11)
RBC: 3.17 MIL/uL — ABNORMAL LOW (ref 3.87–5.11)
RBC: 3.51 MIL/uL — ABNORMAL LOW (ref 3.87–5.11)
RDW: 14.2 % (ref 11.5–15.5)
RDW: 14.3 % (ref 11.5–15.5)
WBC: 5.6 10*3/uL (ref 4.0–10.5)
WBC: 8.8 10*3/uL (ref 4.0–10.5)

## 2010-10-08 LAB — PROTIME-INR
INR: 1.2 (ref 0.00–1.49)
INR: 2.3 — ABNORMAL HIGH (ref 0.00–1.49)
INR: 4.1 — ABNORMAL HIGH (ref 0.00–1.49)
Prothrombin Time: 24.9 seconds — ABNORMAL HIGH (ref 11.6–15.2)
Prothrombin Time: 39.7 seconds — ABNORMAL HIGH (ref 11.6–15.2)

## 2010-10-08 LAB — PROTEIN C ACTIVITY: Protein C Activity: 111 % (ref 75–133)

## 2010-10-08 LAB — URINALYSIS, ROUTINE W REFLEX MICROSCOPIC
Hgb urine dipstick: NEGATIVE
Ketones, ur: 15 mg/dL — AB
Specific Gravity, Urine: 1.027 (ref 1.005–1.030)
Urobilinogen, UA: 1 mg/dL (ref 0.0–1.0)

## 2010-10-08 LAB — BRAIN NATRIURETIC PEPTIDE
Pro B Natriuretic peptide (BNP): 99 pg/mL (ref 0.0–100.0)
Pro B Natriuretic peptide (BNP): <30 pg/mL (ref 0.0–100.0)

## 2010-10-08 LAB — TROPONIN I
Troponin I: 0.01 ng/mL (ref 0.00–0.06)
Troponin I: <0.01 ng/mL (ref 0.00–0.06)

## 2010-10-08 LAB — POCT I-STAT, CHEM 8
BUN: 6 mg/dL (ref 6–23)
Creatinine, Ser: 0.9 mg/dL (ref 0.4–1.2)
Glucose, Bld: 101 mg/dL — ABNORMAL HIGH (ref 70–99)
Potassium: 3.6 mEq/L (ref 3.5–5.1)
Sodium: 136 mEq/L (ref 135–145)

## 2010-10-08 LAB — LUPUS ANTICOAGULANT PANEL: PTT Lupus Anticoagulant: 40.4 secs (ref 36.3–48.8)

## 2010-10-08 LAB — DIFFERENTIAL
Eosinophils Relative: 5 % (ref 0–5)
Lymphocytes Relative: 15 % (ref 12–46)
Lymphs Abs: 1.2 10*3/uL (ref 0.7–4.0)
Monocytes Relative: 4 % (ref 3–12)
Neutro Abs: 7.1 10*3/uL (ref 1.7–7.7)
Neutrophils Relative %: 80 % — ABNORMAL HIGH (ref 43–77)

## 2010-10-08 LAB — COMPREHENSIVE METABOLIC PANEL
ALT: 10 U/L (ref 0–35)
AST: 16 U/L (ref 0–37)
Alkaline Phosphatase: 58 U/L (ref 39–117)
CO2: 26 mEq/L (ref 19–32)
Chloride: 105 mEq/L (ref 96–112)
GFR calc Af Amer: >60 mL/min (ref >60)
GFR calc non Af Amer: >60 mL/min (ref >60)
Potassium: 3.3 mEq/L — ABNORMAL LOW (ref 3.5–5.1)
Sodium: 138 mEq/L (ref 135–145)
Total Bilirubin: 0.6 mg/dL (ref 0.3–1.2)

## 2010-10-08 LAB — BETA-2-GLYCOPROTEIN I ABS, IGG/M/A: Beta-2-Glycoprotein I IgA: <10 U/mL (ref <20)

## 2010-10-08 LAB — PROTEIN C, TOTAL: Protein C, Total: 72 % (ref 70–140)

## 2010-10-08 LAB — PROTEIN S, TOTAL: Protein S Ag, Total: 69 % — ABNORMAL LOW (ref 70–140)

## 2010-10-08 LAB — HOMOCYSTEINE: Homocysteine: 5.1 umol/L (ref 4.0–15.4)

## 2010-10-08 LAB — CARDIOLIPIN ANTIBODIES, IGG, IGM, IGA: Anticardiolipin IgA: <10 [APL'U] — ABNORMAL LOW (ref <12)

## 2010-10-08 LAB — LACTIC ACID, PLASMA: Lactic Acid, Venous: 0.9 mmol/L (ref 0.5–2.2)

## 2010-10-08 LAB — RAPID URINE DRUG SCREEN, HOSP PERFORMED
Barbiturates: NOT DETECTED
Benzodiazepines: NOT DETECTED

## 2010-10-08 LAB — FACTOR 5 LEIDEN

## 2010-10-08 LAB — PROTEIN S ACTIVITY: Protein S Activity: 52 % — ABNORMAL LOW (ref 69–129)

## 2010-10-08 LAB — URINE MICROSCOPIC-ADD ON

## 2010-10-08 LAB — PROTHROMBIN GENE MUTATION

## 2010-11-14 NOTE — H&P (Signed)
NAMETIFFANY, CALMES             ACCOUNT NO.:  [id number]   MEDICAL RECORD NO.:  [id number]          PATIENT TYPE:  INP   LOCATION:  1827                         FACILITY:  [facility]   PHYSICIAN:  Kalman Shan, MD   DATE OF BIRTH:  09/01/1985   DATE OF ADMISSION:  02/07/2009  DATE OF DISCHARGE:                              HISTORY & PHYSICAL   CHIEF COMPLAINT:  Shortness of breath and chest pain.   TIME OF EVALUATION:  February 08, 2009, 3:15 a.m. to 4:15 a.m., 1-hour  evaluation.   HISTORY OF PRESENT ILLNESS:  Yarrow Linhart is a 25 year old  thin/anorexic female who was brought in by her dad.  She has been  diagnosed with pulmonary embolism in the ER and the ER has asked  pulmonary to admit this patient. History dates back to February 03, 2009,  on Thursday when the patient started developing some shortness of  breath.  Not much details is available because the patient is a little  bit sleepy after getting some opiates for her chest pain and the history  is provided by her dad.  Apparently, the patient thought it was another  attack of her asthma and called her primary care MD, who name is unknown  at this point and got an albuterol refill.  Later that evening on February 03, 2009, made a car trip with her dad  to [place] with multiple stops  and spent 3 days there, which is Friday, August 06, Saturday, August 7  and Sunday, August 8 during which time she did not complain about  dyspnea.  She then made her car journey back from [place] on August 08,  Sunday, again taking multiple breaks.  By August 09, Monday, she started  having worsening shortness of breath.  Also, she had chest pain in the  right shoulder and the right infra-axillary posterior region that would  come with shortness of breath.  The shortness of breath got severe  enough to the point that she was gasping for air and they went to the  Urgent Care from where she was transferred to the emergency room.  Her  right-sided chest pain is present in the right shoulder area and the  right lateral back area.  It was described a stabbing knife-like pain,  made worse with inspiration and relieved by rest, but on the final day  of presentation there was no relieving factors.  It is associated with  shortness of breath.  They denied any association with hemoptysis,  edema, syncope, nausea, vomiting, diarrhea, fevers, chills or cancer  diagnosis.   Of note, towards the end of June, early July, the patient says she  strained her right knee and around that time also placed a NuvaRing,  estrogen-based ring replacement that acts as a contraceptive device.  She also smokes periodically.  The exact details of smoking are not  known because the father and she seem to be under playing it.  In  addition for the last 2 weeks, she has had some dental work done and she  was on amoxicillin for that.  She also has a  strong family history of  DVT; enumerated in the family history.   After arrival in the emergency room, her blood pressure was observed to  be normal.  Her pulse was mostly less than 100.  Her respiratory rate  was 25, but mostly below 20.  She was afebrile at 98.  She was  saturating 100% on 2 liters, although there is also reports that she was  saturating 99% on room air.  She is in no obvious distress.  D-dimer  that was elevated, this resulted in a CT scan angiogram of the chest due  to persistent pain and showed a large occlusive pulmonary embolism  extending from the right main pulmonary artery into the branch of the  pulmonary artery to the right lower lobe and right middle lobe with  atelectasis and developing infarction, particularly with the right lower  lobe.  Pulmonary was then asked to admit the patient.   PAST MEDICAL HISTORY:  1. Reported asthma, details are completely unknown. Uses only      albuterol prn  2. Strained right knee towards the end of June, early July, no      specific  treatment.  3. Status post placement of NuvaRing in June, early July.  4. Teeth work done 2 weeks prior to this admission.  5. ER records also indicate that she has had chlamydia and      pyelonephritis in the past.  6. Baseline low bloodpressure 80-90/60 per her dad. This is because of      her thin status  7. Thin status - denuies anorexia or bulemia   SOCIAL HISTORY:  She is a nondrinker.  She is a current smoker and they  are under playing the smoking history.  She used to live in [place], [place], but she moved with her family to [place].  She lives with her dad and step mom.  Her biological mom suffered a  massive stroke.  There is no history of any specific drug abuse,  although medicines do list some Darvocet and Naprosyn use at home, I am  unclear why and for how long.  She is engaged in a custody battle with  her ex-husband or boyfriend.  There have a 84-1/2-year-old daughter who  is currently in [place] with the biological dad.   FAMILY HISTORY:  Positive for deep vein thrombosis.  Her father has a  factor V Leiden deficiency.  Her sister suffered a right lower extremity  DVT and inferior vena cava occlusion at age 37.  The sister is currently  age 66.  She did not have a PE at that time.  The father was tested and  the sister both were confirmed to have factor V Leiden deficiency.  The  patient herself and her other younger sibling were also tested and did  not show to have factor V Leiden deficiency.  Although her mom had a  massive stroke at age 55 and I think is disabled currently.   Chronic low blood pressure.  Dad says that at baseline the patient  always runs blood pressure of 85-90 systolic/60 diastolic.  There is  very similar to mom given her tiny height and small body mass index.  However, here the blood pressure has always been above 95 systolic and  MAP greater than 63.   HOME MEDICATIONS:  Albuterol, amoxicillin, Darvocet and  Naprosyn   ALLERGIES:  LATEX.   PHYSICAL EXAMINATION:  VITAL SIGNS:  She is afebrile, temperature 98.1,  blood pressure has always been above 95 systolic, MAP has been greater  than 65, but occasionally has been lower than that, but it is always  been above 63.  Pulse documented once at 100, but for most part has been  less than 90.  Respiratory rate of less than 20.  Saturating 100% on 2  liters, but on I weaned the oxygen down to 1 liter, she was still  saturating 100%.  She has received 2 liters of fluid so far in the  emergency room.  GENERAL:  This is a thin short female lying in bed, appears comfortable.  Dad says she is comfortable after getting a lot of Dilaudid in the ER  for severe chest pain related to her PE.  CENTRAL NERVOUS SYSTEM:  Alert and oriented x3, mildly sleepy after the  Dilaudid.  Speech normal.  Moves all four extremities.  HEENT:  No elevated JVP.  No neck lymph nodes.  Mallampati class I, no  jaundice.  Pupils equal and reactive to light.  CARDIOVASCULAR:  S1-S2 present.  Regular rate and rhythm, sinus.  No  murmurs.  RESPIRATORY:  Trachea central.  Air entry equal on both sides.  No  crackles.  No rubs.  Normal percussion note.  ABDOMEN:  Soft, nontender.  No hepatojugular reflux.  No mass.  No  tenderness.  EXTREMITIES:  No cyanosis, no clubbing, no edema.  No Homan sign.  SKIN:  Intact.  Joint exam is normal, including right knee.  Back exam  is also normal.   LABORATORY VALUES:  Angiogram of the chest as described above shows  large acute right-sided PE.   Chest x-ray done earlier shows right lower lobe infiltrates.   Urine pregnancy test is negative.   Urinalysis is normal.   CBC shows white count of 8.8, hemoglobin of 9.4, platelet count of  113,000 with 80% neutrophils.   INR is 1.2.   Magnesium is 1.7.   Phosphorus is 3.2, and normal.   Markers of pulmonary embolism severity, include troponin that is normal  at 0.01.  BNP of less than  30 and a lactate of 0.9.   ASSESSMENT AND PLAN:  The is a 25 year old who has suffered a right main  pulmonary artery pulmonary emboli with distal infarction.  Clinical  severity of pulmonary embolism is mild, her PESI score at best is 58,  this is based on an age of 50 and her need for oxygen 2 liters, giving a  combined score of 43, which is class I, which is the mildest form of  pulmonary embolism severity.  Good prognostic factors are the fact her  sex is female, she has no heart or lung problems.  Her blood pressure  has been over 90 systolic throughout.  Her heart rate has been less than  100 most of the time.  She has a normal mental status.  She has got a  normal temperature and she is not tachypneic.  In addition, the normal  BNP, troponin and lactate all indicate a favorable outcome.  A 30-day  mortality risk is extremely low based on this classification.  1. Hypomagnesemia.  Magnesium is 1.7.  2. Mild anemia, hemoglobin of 9.4.  Earlier during admission the      hemoglobin was 12.2 in the emergency room, so this appears to be a      3 Gram drop just in the hospital and this needs to be monitored      because we are starting  anticoagulation.  We will check stool      occult blood.  3. Tobacco Abuse   PLAN:  1. Admit the patient to tele floor based on the PESI score of 43,      which is class I, lowest severity. There is no need for a step-down      or intensive care admission.  2. Continue IV heparin therapy with Coumadin, but will change the IV      heparin to Lovenox given the superiority of Lovenox in reducing      bleeding.  3. Get Doppler of lower extremities.  4. Initiate hypercoagulable workup panel again.  5. Replace her IV magnesium.  6. Check stool occult blood for drop in hemoglobin and watch for signs      of bleeding.  7. For her tobacco abuse, we will advocate substance tobacco cessation      counseling.  8. Discontinue her NuvaRing.  I think the risk  factors for a her      pulmonary embolism was smoking, her recent travel to [place], the      family history and the right knee strain.  9. Tobacco abuse, and tobacco abuse not other specified.  I think the      patient is underplaying this.  This has to be dealt with in more      detail.      Kalman Shan, MD  Electronically Signed     MR/MEDQ  D:  02/08/2009  T:  02/08/2009  Job:  [id number]

## 2010-11-14 NOTE — Discharge Summary (Signed)
Amber Carroll, Amber Carroll             ACCOUNT NO.:  [id number]   MEDICAL RECORD NO.:  [id number]          PATIENT TYPE:  INP   LOCATION:  2022                         FACILITY:  [facility]   PHYSICIAN:  Kalman Shan, MD   DATE OF BIRTH:  1985-12-11   DATE OF ADMISSION:  02/07/2009  DATE OF DISCHARGE:  02/10/2009                               DISCHARGE SUMMARY   DISCHARGE DIAGNOSES:  1. Right pulmonary embolism.  2. Right-sided chest pain.  3. Tobacco abuse.  4. Anemia.   HISTORY OF PRESENT ILLNESS:  Please see complete history and physical  dictated by Dr. Kalman Shan on the day of admission.  Amber Carroll is  a 25 year old thin female who was brought to the ER on February 07, 2009  with complaints of 4-5 days of shortness of breath as well as some back  and right-sided chest pain.  She had recently made several long car  trips up to 8 hours at a time in the days prior to admission.  The  patient denied any hemoptysis, fever, nausea, vomiting, diarrhea or  syncope.  CT scan performed in the ER did show a large occlusive  pulmonary embolism extending from the right pulmonary artery into the  branch of the pulmonary artery to the right lower lobe and right middle  lobe, and Pulmonary was asked to admit the patient.   RADIOLOGY DATA:  CT angio of the chest performed on the day of admission  showed large occlusive pulmonary embolus extending from the right main  pulmonary artery primarily into branches of the pulmonary artery to the  right lower lobe and right middle lobe with atelectasis or developing  infarction particularly within the right lower lobe.  Chest x-ray  performed on August 11, showed increased patchy right lung airspace  disease with small right effusion and new left lower lobe airspace  disease.   LABORATORY DATA:  At the time of admission on August 9, complete blood  count; white blood cells 9.1, hemoglobin 12.2, hematocrit 37.3,  platelets 156.  D-dimer was 6.04.   Complete metabolic panel; sodium 138,  potassium 3.3, glucose 102, BUN 6, creatinine 0.68.  Urine pregnancy was  negative.  Lipase was 28.  Urine drug screen was positive for opiates  and otherwise negative.  Most recent laboratory data; a hypercoagulable  panel performed on August 10 showed antithrombin III level of 88,  protein C functional level of 111, protein S functional level of 52, PTT-  LA was 40.4 and homocysteine was 5.1.  PT 24.9, INR 2.3.  CBC; white  blood cells 5.6, hemoglobin 8.8, hematocrit 26.5, platelets 113.  BNP  99.   MICRO DATA:  No micro data available at this time.   HOSPITAL COURSE BY DISCHARGE DIAGNOSES:  1. Right pulmonary embolism.  The patient was at increased risk due to      her tobacco abuse as well as a recent start on vaginal NuvaRing      hormonal contraceptive as well as extended car trips in the days      prior to admission.  The patient has been started on  Coumadin with      a Lovenox bridge.  At the time of dictation her INR is near      therapeutic at 2.3.  She is currently on day 3 of a 5-day Lovenox      bridge, an appointment has already been made for the patient to      follow up with the Coumadin Clinic at [facility] and will      have an appointment on August 13.  She is to arrive between 8:30      a.m. and 12 noon.  Her hypercoagulable workup was essentially      negative.  She will plan approximately 1 year of Coumadin therapy      and she will also follow up with Dr. Cephas Darby, with      Hematology.  The patient has been instructed not to use any forms      of hormonal contraception.  2. Right-sided chest pain.  Again, this is secondary to right      pulmonary embolism, has been pleuritic in nature secondary to her      pulmonary embolism.  Her pain at the time of discharge is      significantly improved.  She has been using incentive spirometry      and pain at this time is controlled with scheduled Motrin and       Tylenol.  She has also been receiving PPI scheduled in addition to      her scheduled Motrin.  3. Tobacco abuse, although the patient states she is not a heavy      smoker, but certainly puts her at increased risk for pulmonary      embolism.  She has been counseled on tobacco cessation and in the      importance of no smoking due to her recent pulmonary embolism and      she verbalizes understanding and agrees to quit smoking.  4. Anemia.  On admission, the patient's hemoglobin was 13.2, most      recent hemoglobin was 8.8.  She has no obvious signs and symptoms      of bleeding.  She was likely fairly dehydrated at the time of      admission, however, due to her anticoagulation, before actual      discharge, will follow up another hemoglobin and check her stool      for occult blood; if these are negative, will plan discharge today,      August 12.   DISCHARGE MEDICATIONS:  1. Lovenox 40 mg subcu q.12 h. x2 days and then stop.  2. Protonix 40 mg p.o. daily.  3. Tylenol 650 mg p.o. q.6 h. x4-5 days.  4. Ibuprofen 600 mg p.o. q.8 hours x2-3 days.  5. Coumadin, this will be per the Coumadin Clinic at [facility].   FOLLOWUP:  The patient is to follow up with Dr. Cyndie Chime in  approximately 2-3 weeks.  The patient will call herself to make this  appointment.  The patient is also to follow up on August 13 between 8:30  a.m. and 12 noon with the Coumadin Clinic at [facility], this  appointment time was given to the patient.   DISPOSITION:  The patient is discharged in improved condition.  She has  met maximum benefit from her inpatient hospitalization.  She will be  discharged on two remaining days of her Lovenox/Coumadin bridge and will  continue Coumadin  for approximately 1 year with the Coumadin Clinic at  [facility] and this will be followed by Dr. Cephas Darby of  Hematology   DISCHARGE DIET:  The patient is to be discharged on a regular diet.   Note, the patient has had some issues with eating disorders in the past.  She met with Nutrition during this admission and has had some education  on Coumadin interactions with food as well.   ACTIVITY:  The patient is discharged with activity as tolerated.  She  has been educated on the things to be cautious of given her new Coumadin  regimen.      Dirk Dress, NP      Kalman Shan, MD  Electronically Signed   KW/MEDQ  D:  02/10/2009  T:  02/10/2009  Job:  [id number]   cc:   Shelby Dubin, PharmD, BCPS, CPP

## 2010-11-14 NOTE — Assessment & Plan Note (Signed)
[facility]                                 ON-CALL NOTE   Amber Carroll, Amber Carroll                      MRN:          [id number]  DATE:02/12/2009                            DOB:          Aug 13, 1985    I got a call from Ms. [name] today, requesting a prescription for  Lovenox.  She states that she was told that she needs to take subcu  Lovenox until her blood is rechecked on Monday.  Of note is that Ms.  [name] had a recent pulmonary embolism and she was started on bridging  Lovenox and Coumadin.  I reviewed her blood work.  Her most recent INR  was checked yesterday and it was 8.8.  The day before, it was 4.1.  Her  understanding of the instructions is that she had to stop the Coumadin,  but she had to go back on the Lovenox until her blood work was  rechecked.  I instructed her not to take either Coumadin or Lovenox  until her blood work is rechecked on Monday, which is 2 days from now.  I explained to her the rationale behind this.  I also explained to her  the risk of bleeding at such high INRs and the importance of being  careful as far as avoiding falls or injuries as much as possible.  She  seemed to understand these instructions and was agreeable.     Kirby Funk, MD    WF/MedQ  DD: 02/12/2009  DT: 02/13/2009  Job #: [id number]

## 2011-04-03 LAB — COMPREHENSIVE METABOLIC PANEL
ALT: 7
AST: 21
Alkaline Phosphatase: 62
CO2: 26
Calcium: 9.7
Chloride: 102
GFR calc Af Amer: >60
GFR calc non Af Amer: >60
Glucose, Bld: 111 — ABNORMAL HIGH
Potassium: 3.5
Sodium: 142

## 2011-04-03 LAB — URINALYSIS, ROUTINE W REFLEX MICROSCOPIC
Bilirubin Urine: NEGATIVE
Hgb urine dipstick: NEGATIVE
Ketones, ur: NEGATIVE
Specific Gravity, Urine: 1.022
Urobilinogen, UA: 1
pH: 6

## 2011-04-03 LAB — DIFFERENTIAL
Basophils Relative: 1
Eosinophils Absolute: 0.2
Eosinophils Relative: 2
Lymphs Abs: 3.1
Neutrophils Relative %: 54

## 2011-04-03 LAB — CBC
Hemoglobin: 13.9
RBC: 4.73
RDW: 14

## 2011-04-03 LAB — LIPASE, BLOOD: Lipase: 82

## 2012-07-22 ENCOUNTER — Telehealth: Payer: Self-pay | Admitting: Internal Medicine

## 2012-07-22 NOTE — Telephone Encounter (Signed)
Pt. Calls with complaints of pain and swelling behind her Rt. Knee.  States Sxs began 1-2 weeks ago and have persisted.  She was seen last night at [facility] and given an injection of Lovenox but reports that a venous doppler was never performed.  Upon discharge, she was instructed to F/U with Dr. Cyndie Chime ASAP.  She was concerned that she could have a blood clot in the affected extremity which prompted her call this evening.  She denies any trauam to the affetced extremity and is not on any anti-coagulation. She reports that she presented with similar Sxs in the same leg when she was Dx'ed with PTE.  She is in [place], [place] this evening and unable to make the drive to [facility] hospital.  Assessment/Plan: 27 yo female with Hx of PTe calling with complaints of pain and swelling in behind her Rt. Knee.  Concern for possibility of DVT.   -Instructed patient to present back to [facility] for evaluation. -Will notify [facility] of impending arrival and ask them to perform venous doppler of LE -Will notify Dr. Cyndie Chime of Sxs.  Pt. Endorsed understanding of the current plan.

## 2012-07-23 NOTE — Telephone Encounter (Signed)
Young woman with history of unpredictable pulmonary embolus in August 2010 just after starting oral contraceptives. Hypercoagulation evaluation unremarkable. Initial borderline decreased protein as was not reproducible on repeat. Dr. 5 Leiden mutation not detected despite family history of the same. She declined to take extended anticoagulation against my advice and at time of last visit with me in February 2011 was advised to go on aspirin 81 mg daily as a compromise.

## 2014-07-09 ENCOUNTER — Encounter (HOSPITAL_COMMUNITY): Payer: Self-pay | Admitting: *Deleted

## 2014-07-09 ENCOUNTER — Emergency Department (HOSPITAL_COMMUNITY)
Admission: EM | Admit: 2014-07-09 | Discharge: 2014-07-09 | Disposition: A | Discharge status: Home / Self Care | Payer: Self-pay | Attending: Emergency Medicine | Admitting: Emergency Medicine

## 2014-07-09 DIAGNOSIS — S50861A Insect bite (nonvenomous) of right forearm, initial encounter: Secondary | ICD-10-CM | POA: Insufficient documentation

## 2014-07-09 DIAGNOSIS — Z86711 Personal history of pulmonary embolism: Secondary | ICD-10-CM | POA: Insufficient documentation

## 2014-07-09 DIAGNOSIS — Y9389 Activity, other specified: Secondary | ICD-10-CM | POA: Insufficient documentation

## 2014-07-09 DIAGNOSIS — Y998 Other external cause status: Secondary | ICD-10-CM | POA: Insufficient documentation

## 2014-07-09 DIAGNOSIS — Z720 Tobacco use: Secondary | ICD-10-CM | POA: Insufficient documentation

## 2014-07-09 DIAGNOSIS — S90862A Insect bite (nonvenomous), left foot, initial encounter: Secondary | ICD-10-CM | POA: Insufficient documentation

## 2014-07-09 DIAGNOSIS — S50862A Insect bite (nonvenomous) of left forearm, initial encounter: Secondary | ICD-10-CM | POA: Insufficient documentation

## 2014-07-09 DIAGNOSIS — W57XXXA Bitten or stung by nonvenomous insect and other nonvenomous arthropods, initial encounter: Secondary | ICD-10-CM | POA: Insufficient documentation

## 2014-07-09 DIAGNOSIS — Y9289 Other specified places as the place of occurrence of the external cause: Secondary | ICD-10-CM | POA: Insufficient documentation

## 2014-07-09 DIAGNOSIS — S90861A Insect bite (nonvenomous), right foot, initial encounter: Secondary | ICD-10-CM | POA: Insufficient documentation

## 2014-07-09 DIAGNOSIS — Z91040 Latex allergy status: Secondary | ICD-10-CM | POA: Insufficient documentation

## 2014-07-09 HISTORY — DX: Other pulmonary embolism without acute cor pulmonale: I26.99

## 2014-07-09 MED ORDER — CETIRIZINE HCL 10 MG PO TABS: 10.0000 mg | ORAL_TABLET | Freq: Every day | ORAL | Status: DC

## 2014-07-09 MED ORDER — HYDROXYZINE HCL 25 MG PO TABS: 25.0000 mg | ORAL_TABLET | Freq: Four times a day (QID) | ORAL | Status: DC

## 2014-07-09 MED ORDER — PREDNISONE 10 MG PO TABS: 40.0000 mg | ORAL_TABLET | Freq: Every day | ORAL | Status: DC

## 2014-07-09 NOTE — Discharge Instructions (Signed)
Insect Bite Mosquitoes, flies, fleas, bedbugs, and many other insects can bite. Insect bites are different from insect stings. A sting is when venom is injected into the skin. Some insect bites can transmit infectious diseases. SYMPTOMS  Insect bites usually turn red, swell, and itch for 2 to 4 days. They often go away on their own. TREATMENT  Your caregiver may prescribe antibiotic medicines if a bacterial infection develops in the bite. HOME CARE INSTRUCTIONS  Do not scratch the bite area.  Keep the bite area clean and dry. Wash the bite area thoroughly with soap and water.  Put ice or cool compresses on the bite area.  Put ice in a plastic bag.  Place a towel between your skin and the bag.  Leave the ice on for 20 minutes, 4 times a day for the first 2 to 3 days, or as directed.  You may apply a baking soda paste, cortisone cream, or calamine lotion to the bite area as directed by your caregiver. This can help reduce itching and swelling.  Only take over-the-counter or prescription medicines as directed by your caregiver.  If you are given antibiotics, take them as directed. Finish them even if you start to feel better. You may need a tetanus shot if:  You cannot remember when you had your last tetanus shot.  You have never had a tetanus shot.  The injury broke your skin. If you get a tetanus shot, your arm may swell, get red, and feel warm to the touch. This is common and not a problem. If you need a tetanus shot and you choose not to have one, there is a rare chance of getting tetanus. Sickness from tetanus can be serious. SEEK IMMEDIATE MEDICAL CARE IF:   You have increased pain, redness, or swelling in the bite area.  You see a red line on the skin coming from the bite.  You have a fever.  You have joint pain.  You have a headache or neck pain.  You have unusual weakness.  You have a rash.  You have chest pain or shortness of breath.  You have abdominal pain,  nausea, or vomiting.  You feel unusually tired or sleepy. MAKE SURE YOU:   Understand these instructions.  Will watch your condition.  Will get help right away if you are not doing well or get worse. Document Released: 07/26/2004 Document Revised: 09/10/2011 Document Reviewed: 01/17/2011 [facility] Patient Information 2015 [place], [place]. This information is not intended to replace advice given to you by your health care provider. Make sure you discuss any questions you have with your health care provider.  Bedbugs Bedbugs are tiny bugs that live in and around beds. During the day, they hide in mattresses and other places near beds. They come out at night and bite people lying in bed. They need blood to live and grow. Bedbugs can be found in beds anywhere. Usually, they are found in places where many people come and go (hotels, shelters, hospitals). It does not matter whether the place is dirty or clean. Getting bitten by bedbugs rarely causes a medical problem. The biggest problem can be getting rid of them. This often takes the work of a Oncologist. CAUSES  Less use of pesticides. Bedbugs were common before the 1950s. Then, strong pesticides such as DDT nearly wiped them out. Today, these pesticides are not used because they harm the environment and can cause health problems.  More travel. Besides mattresses, bedbugs can also live  in clothing and luggage. They can come along as people travel from place to place. Bedbugs are more common in certain parts of the world. When people travel to those areas, the bugs can come home with them.  Presence of birds and bats. Bedbugs often infest birds and bats. If you have these animals in or near your home, bedbugs may infest your house, too. SYMPTOMS It does not hurt to be bitten by a bedbug. You will probably not wake up when you are bitten. Bedbugs usually bite areas of the skin that are not covered. Symptoms may show when you wake up, or  they may take a day or more to show up. Symptoms may include:  Small red bumps on the skin. These might be lined up in a row or clustered in a group.  A darker red dot in the middle of red bumps.  Blisters on the skin. There may be swelling and very bad itching. These may be signs of an allergic reaction. This does not happen often. DIAGNOSIS Bedbug bites might look and feel like other types of insect bites. The bugs do not stay on the body like ticks or lice. They bite, drop off, and crawl away to hide. Your caregiver will probably:  Ask about your symptoms.  Ask about your recent activities and travel.  Check your skin for bedbug bites.  Ask you to check at home for signs of bedbugs. You should look for:  Spots or stains on the bed or nearby. This could be from bedbugs that were crushed or from their eggs or waste.  Bedbugs themselves. They are reddish-brown, oval, and flat. They do not fly. They are about the size of an apple seed.  Places to look for bedbugs include:  Beds. Check mattresses, headboards, box springs, and bed frames.  On drapes and curtains near the bed.  Under carpeting in the bedroom.  Behind electrical outlets.  Behind any wallpaper that is peeling.  Inside luggage. TREATMENT Most bedbug bites do not need treatment. They usually go away on their own in a few days. The bites are not dangerous. However, treatment may be needed if you have scratched so much that your skin has become infected. You may also need treatment if you are allergic to bedbug bites. Treatment options include:  A drug that stops swelling and itching (corticosteroid). Usually, a cream is rubbed on the skin. If you have a bad rash, you may be given a corticosteroid pill.  Oral antihistamines. These are pills to help control itching.  Antibiotic medicines. An antibiotic may be prescribed for infected skin. HOME CARE INSTRUCTIONS   Take any medicine prescribed by your caregiver for  your bites. Follow the directions carefully.  Consider wearing pajamas with long sleeves and pant legs.  Your bedroom may need to be treated. A pest control expert should make sure the bedbugs are gone. You may need to throw away mattresses or luggage. Ask the pest control expert what you can do to keep the bedbugs from coming back. Common suggestions include:  Putting a plastic cover over your mattress.  Washing and drying your clothes and bedding in hot water and a hot dryer. The temperature should be hotter than 120 F (48.9 C). Bedbugs are killed by high temperatures.  Vacuuming carefully all around your bed. Vacuum in all cracks and crevices where the bugs might hide. Do this often.  Carefully checking all used furniture, bedding, or clothes that you bring into your house.  Eliminating bird nests and bat roosts.  If you get bedbug bites when traveling, check all your possessions carefully before bringing them into your house. If you find any bugs on clothes or in your luggage, consider throwing those items away. SEEK MEDICAL CARE IF:  You have red bug bites that keep coming back.  You have red bug bites that itch badly.  You have bug bites that cause a skin rash.  You have scratch marks that are red and sore. SEEK IMMEDIATE MEDICAL CARE IF: You have a fever. Document Released: 07/21/2010 Document Revised: 09/10/2011 Document Reviewed: 07/21/2010 [facility] Patient Information 2015 [place]. This information is not intended to replace advice given to you by your health care provider. Make sure you discuss any questions you have with your health care provider.

## 2014-07-09 NOTE — ED Provider Notes (Signed)
CSN: [id number]     Arrival date & time 07/09/14  0636 History   First MD Initiated Contact with Patient 07/09/14 [phone]     Chief Complaint  Patient presents with  . Rash     (Consider location/radiation/quality/duration/timing/severity/associated sxs/prior Treatment) Patient is a 29 y.o. female presenting with rash. The history is provided by the patient. No language interpreter was used.  Rash Location:  Shoulder/arm and foot Shoulder/arm rash location:  L arm and R arm Foot rash location:  L foot and R foot Quality: itchiness and redness   Severity:  Moderate Onset quality:  Sudden Timing:  Constant Progression:  Worsening Chronicity:  New Context: animal contact   Relieved by:  Nothing Ineffective treatments:  Antihistamines and anti-itch cream Associated symptoms: no abdominal pain, no diarrhea, no fatigue, no fever, no headaches, no hoarse voice, no induration, no joint pain, no myalgias, no nausea, no periorbital edema, no shortness of breath, no sore throat, no throat swelling, no tongue swelling, no URI, not vomiting and not wheezing     Past Medical History  Diagnosis Date  . Pulmonary embolism 01/2009   No past surgical history on file. No family history on file. History  Substance Use Topics  . Smoking status: Current Every Day Smoker -- 0.50 packs/day for 3 years    Types: Cigarettes  . Smokeless tobacco: Never Used  . Alcohol Use: No   OB History    No data available     Review of Systems  Constitutional: Negative for fever and fatigue.  HENT: Negative for hoarse voice and sore throat.   Respiratory: Negative for shortness of breath and wheezing.   Gastrointestinal: Negative for nausea, vomiting, abdominal pain and diarrhea.  Musculoskeletal: Negative for myalgias and arthralgias.  Skin: Positive for rash.  Neurological: Negative for headaches.  All other systems reviewed and are negative.     Allergies  Latex  Home Medications   Prior to  Admission medications   Medication Sig Start Date End Date Taking? Authorizing Provider  cetirizine (ZYRTEC ALLERGY) 10 MG tablet Take 1 tablet (10 mg total) by mouth daily. 07/09/14   Arthor CaptainAbigail Fredia Chittenden, PA-C  hydrOXYzine (ATARAX/VISTARIL) 25 MG tablet Take 1 tablet (25 mg total) by mouth every 6 (six) hours. 07/09/14   Arthor CaptainAbigail Pavel Gadd, PA-C  predniSONE (DELTASONE) 10 MG tablet Take 4 tablets (40 mg total) by mouth daily. 07/09/14   Mathieu Schloemer, PA-C   BP 104/60 mmHg  Pulse 80  Temp(Src) 98.3 F (36.8 C) (Oral)  Resp 16  Ht 5\' 3"  (1.6 m)  Wt 92 lb (41.731 kg)  BMI 16.30 kg/m2  SpO2 100%  LMP 05/25/2014 (Within Days) Physical Exam  Constitutional: She is oriented to person, place, and time. She appears well-developed and well-nourished. No distress.  HENT:  Head: Normocephalic and atraumatic.  Eyes: Conjunctivae are normal. No scleral icterus.  Neck: Normal range of motion.  Cardiovascular: Normal rate, regular rhythm and normal heart sounds.  Exam reveals no gallop and no friction rub.   No murmur heard. Pulmonary/Chest: Effort normal and breath sounds normal. No respiratory distress.  Abdominal: Soft. Bowel sounds are normal. She exhibits no distension and no mass. There is no tenderness. There is no guarding.  Neurological: She is alert and oriented to person, place, and time.  Skin: Skin is warm and dry. She is not diaphoretic.  Multiple singular erythematous papules with circumferential wheels  Vitals reviewed.   ED Course  Procedures (including critical care time) Labs Review Labs Reviewed -  No data to display  Imaging Review No results found.   EKG Interpretation None      MDM   Final diagnoses:  Bug bites    Patient with bug bites. ? Fleas vx bed bugs.  Facial involvement and local reactions. Will treat with prednisone, zyrtec, atarax. Home care instructions given and return precautions discussed.    Arthor Captain, PA-C 07/09/14 0715  Loren Racer,  MD 07/13/14 [phone]

## 2014-07-09 NOTE — ED Notes (Signed)
Pt c/o rash on both lower arms and left foot, area red and itching. Pt states she recently had someone move into the house with cats.

## 2017-07-23 DIAGNOSIS — F5101 Primary insomnia: Secondary | ICD-10-CM | POA: Insufficient documentation

## 2017-07-23 DIAGNOSIS — F419 Anxiety disorder, unspecified: Secondary | ICD-10-CM | POA: Insufficient documentation

## 2020-03-14 ENCOUNTER — Ambulatory Visit (HOSPITAL_COMMUNITY)
Admission: AD | Admit: 2020-03-14 | Discharge: 2020-03-14 | Disposition: A | Discharge status: Home / Self Care | Payer: BC Managed Care – PPO | Attending: Psychiatry | Admitting: Psychiatry

## 2020-03-14 DIAGNOSIS — G479 Sleep disorder, unspecified: Secondary | ICD-10-CM | POA: Insufficient documentation

## 2020-03-14 DIAGNOSIS — F329 Major depressive disorder, single episode, unspecified: Secondary | ICD-10-CM | POA: Diagnosis not present

## 2020-03-14 DIAGNOSIS — F419 Anxiety disorder, unspecified: Secondary | ICD-10-CM | POA: Insufficient documentation

## 2020-03-14 NOTE — H&P (Signed)
Behavioral Health Medical Screening Exam  Amber Carroll is an 34 y.o. female.  Total Time spent with patient: 15 minutes  Psychiatric Specialty Exam: Physical Exam Constitutional:      General: She is not in acute distress.    Appearance: She is not ill-appearing or toxic-appearing.  Neurological:     Mental Status: She is alert.  Psychiatric:        Attention and Perception: Attention normal.        Speech: Speech normal.        Thought Content: Thought content is not paranoid or delusional. Thought content does not include homicidal or suicidal ideation.    Review of Systems  Constitutional: Negative for chills and fever.  HENT: Negative for sore throat.   Respiratory: Negative for cough.   Cardiovascular: Negative for chest pain.  Gastrointestinal: Negative for diarrhea, nausea and vomiting.  Psychiatric/Behavioral: Positive for dysphoric mood and sleep disturbance. Negative for hallucinations, self-injury and suicidal ideas. The patient is nervous/anxious.   All other systems reviewed and are negative.  Blood pressure 113/81, pulse 75, temperature 98.6 F (37 C), temperature source Oral, resp. rate 18, SpO2 100 %.There is no height or weight on file to calculate BMI. General Appearance: Casual and Neat Eye Contact:  Fair Speech:  Clear and Coherent and Normal Rate Volume:  Normal Mood:  Anxious Affect:  Congruent Thought Process:  Coherent, Goal Directed, Linear and Descriptions of Associations: Intact Orientation:  Full (Time, Place, and Person) Thought Content:  Logical Suicidal Thoughts:  No Homicidal Thoughts:  No Memory:  Immediate;   Good Recent;   Good Remote;   Good Judgement:  Fair Insight:  Fair Psychomotor Activity:  Normal Concentration: Concentration: Good and Attention Span: Good Recall:  Good Fund of Knowledge:Good Language: Good Akathisia:  Negative Handed:  Right AIMS (if indicated):    Assets:  Communication Skills Desire for  Improvement Financial Resources/Insurance Housing Physical Health Sleep:      Blood pressure 113/81, pulse 75, temperature 98.6 F (37 C), temperature source Oral, resp. rate 18, SpO2 100 %.  Recommendations: Based on my evaluation the patient does not appear to have an emergency medical condition.   Disposition: No evidence of imminent risk to self or others at present.   Patient does not meet criteria for psychiatric inpatient admission. Supportive therapy provided about ongoing stressors. Discussed crisis plan, support from social network, calling 911, coming to the Emergency Department, and calling Suicide Hotline.   Jackelyn Poling, NP 03/14/2020, 11:24 PM
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
## 2020-08-24 DIAGNOSIS — I82422 Acute embolism and thrombosis of left iliac vein: Secondary | ICD-10-CM | POA: Insufficient documentation

## 2020-09-01 DIAGNOSIS — Z716 Tobacco abuse counseling: Secondary | ICD-10-CM | POA: Insufficient documentation

## 2020-09-01 DIAGNOSIS — I871 Compression of vein: Secondary | ICD-10-CM | POA: Insufficient documentation

## 2020-09-01 DIAGNOSIS — I959 Hypotension, unspecified: Secondary | ICD-10-CM | POA: Insufficient documentation

## 2020-09-01 DIAGNOSIS — Z86711 Personal history of pulmonary embolism: Secondary | ICD-10-CM | POA: Insufficient documentation

## 2020-09-01 DIAGNOSIS — R200 Anesthesia of skin: Secondary | ICD-10-CM | POA: Insufficient documentation

## 2020-09-01 DIAGNOSIS — I82412 Acute embolism and thrombosis of left femoral vein: Secondary | ICD-10-CM | POA: Insufficient documentation

## 2020-09-01 DIAGNOSIS — Q2112 Patent foramen ovale: Secondary | ICD-10-CM | POA: Insufficient documentation

## 2021-06-16 ENCOUNTER — Ambulatory Visit (HOSPITAL_COMMUNITY)
Admission: EM | Admit: 2021-06-16 | Discharge: 2021-06-16 | Disposition: A | Discharge status: Home / Self Care | Payer: No Payment, Other | Attending: Behavioral Health | Admitting: Behavioral Health

## 2021-06-16 DIAGNOSIS — Z560 Unemployment, unspecified: Secondary | ICD-10-CM | POA: Insufficient documentation

## 2021-06-16 DIAGNOSIS — F4321 Adjustment disorder with depressed mood: Secondary | ICD-10-CM | POA: Insufficient documentation

## 2021-06-16 DIAGNOSIS — R45851 Suicidal ideations: Secondary | ICD-10-CM | POA: Insufficient documentation

## 2021-06-16 DIAGNOSIS — Z6379 Other stressful life events affecting family and household: Secondary | ICD-10-CM | POA: Insufficient documentation

## 2021-06-16 NOTE — Progress Notes (Signed)
Patient is a 35 year old female that presents this date with passive S/I stating she feels at times that she "wants to go to sleep and not wake up" although denies those feelings at the time of assessment. Patient denies any H/I or AVH. Patient reports her primary stressor is associated with a ongoing custody battle over her 59 year old child that informed her during a counseling session yesterday that she doesn't want to see her mother anymore. Patient is brought in by her father Ernesta Amble [phone]) who is present during evaluation. Patient is currently unemployed and resides with her father and stepmother who is her primary support. Patient denies any prior attempts or gestures at self harm. Patient states she has been diagnosed with MDD/GAD and receives OP medication management from her PCP Toppin MD. Patient states she is prescribed Celexa 10 mg and Xanax 0.25 prn. Patient denies any SA issues. Patient is unsure of what services she is in need of this date and "just needs to talk to someone."

## 2021-06-16 NOTE — Discharge Instructions (Signed)

## 2021-06-16 NOTE — ED Notes (Signed)
Discharge instructions provided and Pt stated understanding. Pt alert, orient and ambulatory prior to d/c from facility. Personal belongings returned. Escorted PT to the front lobby and directed her towards the elevator to go upstairs to make an appointment. Safety maintained.

## 2021-06-16 NOTE — ED Provider Notes (Signed)
Behavioral Health Urgent Care Medical Screening Exam  Patient Name: Amber Carroll MRN: [id number] Date of Evaluation: 06/16/21 Diagnosis:  Final diagnoses:  Suicidal ideation  Adjustment disorder with depressed mood    History of Present illness: Amber Carroll is a 35 y.o. female who patient presented to [facility] as a walk in accompanied by her father and step-mother with complaints of "suicidal thoughts yesterday and today."  Amber Carroll, 35 y.o., female patient seen face to face by this provider, consulted with Dr. Bronwen Betters and chart reviewed on 06/16/21. On valuation Amber Carroll is in a sitting position in no acute distress. She is alert/oriented x 4. She is calm and cooperative. Her mood is depressed and congruent with affect. She is speaking in a clear tone at moderate volume, and normal pace; with good eye contact. Her thought process is coherent and relevant. There is no indication that she is currently responding to internal/external stimuli or experiencing delusional thought content.   On evaluation Amber Carroll reports that she has been going through a court custody for her 93 year old daughter for the past 13 years.  She states that her and her daughter started therapy a month ago and during the therapy session yesterday her daughter told her she no longer wanted to have therapy with her or see her. She states that she's having a hard time coping with not being able to speak to her daughter.   She endorses passive suicidal ideations that started yesterday with no plan or intent. She denies active suicidal thought as this time. She states that she would never attempt suicide because she doesn't want to leave her daughter or hurt her father or step-mother. She verbally contracts for safety. She denies a past hx of attempting suicide. She denies current and past self harm behaviors. She denies HI. She denies auditory and visual hallucinations. There is no evidence that she is  responding to internal or external stimuli.   She reports feeling increasingly depressed for the past three weeks since her daughter cut off communicating with her. She describes her depressive symptoms as sadness, worthlessness, hopelessness, decreased energy, crying spells, irritability, guilt, and low self-esteem. She reports sleeping 5 to 9 hours on average. She reports a fair appetite. She reports a hx of depression and anxiety that started in 2009. She denies symptoms of anxiety at this time. She states that she is prescribed xanaz 0.5 mg BID as needed, and Lexarpo 10 mg daily prescribed by her PCP Dr. Zeb Comfort. She states that she follows up with a Dr. Beckey Downing a psychologist in [place] for in-person and telehealth therapy sessions every two weeks. She does not have a current psychiatrist. She states that she called today and have a follow up appointment with Dr. Hyacinth Meeker for outpatient psychiatry. She states that she does not have insurance and is interested in outpatient resources in [place]. She denies drinking alcohol or using illicit drugs.   Patient resides with her father and step-mother. She states that she commutes from [place] to [place] to visit her daughter. She is unemployed. She is a Consulting civil engineer, studying supply Scientist, product/process development. She states that her father, step-mother, sister and neighbor are her support persons. She identifies a sense of responsibility to family and religious beliefs as protective factors. She identifies listening to [employer], hiking, walking dog and looking a tik-tok as ways to cope with stress, depression and suicidal thoughts. Patient denies having access to weapons.   At this time Amber Carroll is educated and verbalizes understanding of  mental health resources and other crisis services in the community. She is instructed to call 911 and present to the nearest emergency room should she experience any suicidal/homicidal ideation, auditory/visual/hallucinations, or detrimental  worsening of her mental health condition.   Psychiatric Specialty Exam  Presentation  General Appearance:Appropriate for Environment  Eye Contact:Fair  Speech:Clear and Coherent  Speech Volume:Normal  Handedness:Right   Mood and Affect  Mood:Depressed  Affect:Congruent   Thought Process  Thought Processes:Goal Directed; Coherent  Descriptions of Associations:Intact  Orientation:Full (Time, Place and Person)  Thought Content:WDL    Hallucinations:None  Ideas of Reference:None  Suicidal Thoughts:Yes, Passive Without Intent; Without Plan  Homicidal Thoughts:No   Sensorium  Memory:Immediate Fair; Remote Fair; Recent Fair  Judgment:Fair  Insight:Fair   Executive Functions  Concentration:Fair  Attention Span:Fair  Recall:Fair  Fund of Knowledge:Fair  Language:Fair   Psychomotor Activity  Psychomotor Activity:Normal   Assets  Assets:Communication Skills; Desire for Improvement; Housing; Leisure Time; Physical Health; Transportation; Resilience; Social Support; Vocational/Educational   Sleep  Sleep:Fair  Number of hours: 8  Physical Exam: Physical Exam Constitutional:      Appearance: Normal appearance.  HENT:     Head: Normocephalic.     Nose: Nose normal.  Eyes:     Conjunctiva/sclera: Conjunctivae normal.  Cardiovascular:     Rate and Rhythm: Normal rate.  Pulmonary:     Effort: Pulmonary effort is normal.  Musculoskeletal:        General: Normal range of motion.     Cervical back: Normal range of motion.  Neurological:     Mental Status: She is alert and oriented to person, place, and time.   Review of Systems  Constitutional: Negative.   HENT: Negative.    Eyes: Negative.   Respiratory: Negative.    Cardiovascular: Negative.   Gastrointestinal: Negative.   Genitourinary: Negative.   Musculoskeletal: Negative.   Skin: Negative.   Neurological: Negative.   Endo/Heme/Allergies: Negative.   Psychiatric/Behavioral:   Positive for suicidal ideas.   Blood pressure 110/80, pulse 90, temperature 98.5 F (36.9 C), temperature source Oral, resp. rate 16, SpO2 100 %. There is no height or weight on file to calculate BMI.  Musculoskeletal: Strength & Muscle Tone: within normal limits Gait & Station: normal Patient leans: N/A   [facility] MSE Discharge Disposition for Follow up and Recommendations: Based on my evaluation the patient does not appear to have an emergency medical condition and can be discharged with resources and follow up care in outpatient services for Medication Management, Individual Therapy, and Group Therapy  Follow up recommendations:   Follow-up Information     Call  [facility].   Specialty: Urgent Care Why: to schedule a new patient appointment for therapy and psychiatry Contact information: [phone] [place] [address] [phone]                 Layla Barter, NP 06/16/2021, 2:11 PM

## 2021-06-19 ENCOUNTER — Ambulatory Visit (INDEPENDENT_AMBULATORY_CARE_PROVIDER_SITE_OTHER): Payer: No Payment, Other | Admitting: Licensed Clinical Social Worker

## 2021-06-19 ENCOUNTER — Other Ambulatory Visit: Payer: Self-pay

## 2021-06-19 ENCOUNTER — Encounter (HOSPITAL_COMMUNITY): Payer: Self-pay | Admitting: Licensed Clinical Social Worker

## 2021-06-19 DIAGNOSIS — F331 Major depressive disorder, recurrent, moderate: Secondary | ICD-10-CM

## 2021-06-19 DIAGNOSIS — F411 Generalized anxiety disorder: Secondary | ICD-10-CM
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
## 2021-06-27 ENCOUNTER — Telehealth (HOSPITAL_COMMUNITY): Payer: Self-pay | Admitting: Internal Medicine

## 2021-06-27 NOTE — BH Assessment (Signed)
Care Management - Follow Up [facility] Discharges   Writer made contact with the patient. Patient reports that she had a follow up appt wtih Richardson Dopp, LCSW on 06-19-21 with [facility].  Patient will follow up with another appointment on 08-21-2021

## 2021-06-29 ENCOUNTER — Encounter (HOSPITAL_BASED_OUTPATIENT_CLINIC_OR_DEPARTMENT_OTHER): Payer: Self-pay | Admitting: Emergency Medicine

## 2021-06-29 ENCOUNTER — Other Ambulatory Visit: Payer: Self-pay

## 2021-06-29 ENCOUNTER — Emergency Department (HOSPITAL_BASED_OUTPATIENT_CLINIC_OR_DEPARTMENT_OTHER)
Admission: EM | Admit: 2021-06-29 | Discharge: 2021-06-29 | Disposition: A | Discharge status: Home / Self Care | Payer: Medicaid Other | Attending: Emergency Medicine | Admitting: Emergency Medicine

## 2021-06-29 DIAGNOSIS — Z7901 Long term (current) use of anticoagulants: Secondary | ICD-10-CM | POA: Insufficient documentation

## 2021-06-29 DIAGNOSIS — F17210 Nicotine dependence, cigarettes, uncomplicated: Secondary | ICD-10-CM | POA: Insufficient documentation

## 2021-06-29 DIAGNOSIS — J45909 Unspecified asthma, uncomplicated: Secondary | ICD-10-CM | POA: Insufficient documentation

## 2021-06-29 DIAGNOSIS — M79621 Pain in right upper arm: Secondary | ICD-10-CM | POA: Insufficient documentation

## 2021-06-29 DIAGNOSIS — M79601 Pain in right arm: Secondary | ICD-10-CM

## 2021-06-29 LAB — CBC
HCT: 39.6 % (ref 36.0–46.0)
Hemoglobin: 12.7 g/dL (ref 12.0–15.0)
MCH: 29.3 pg (ref 26.0–34.0)
MCHC: 32.1 g/dL (ref 30.0–36.0)
MCV: 91.5 fL (ref 80.0–100.0)
Platelets: 219 10*3/uL (ref 150–400)
RBC: 4.33 MIL/uL (ref 3.87–5.11)
RDW: 15.1 % (ref 11.5–15.5)
WBC: 8.1 10*3/uL (ref 4.0–10.5)
nRBC: 0 % (ref 0.0–0.2)

## 2021-06-29 LAB — BASIC METABOLIC PANEL
Anion gap: 8 (ref 5–15)
BUN: 7 mg/dL (ref 6–20)
CO2: 24 mmol/L (ref 22–32)
Calcium: 9.7 mg/dL (ref 8.9–10.3)
Chloride: 104 mmol/L (ref 98–111)
Creatinine, Ser: 0.73 mg/dL (ref 0.44–1.00)
GFR, Estimated: >60 mL/min (ref >60)
Glucose, Bld: 80 mg/dL (ref 70–99)
Potassium: 3.6 mmol/L (ref 3.5–5.1)
Sodium: 136 mmol/L (ref 135–145)

## 2021-06-29 LAB — PREGNANCY, URINE: Preg Test, Ur: NEGATIVE

## 2021-06-29 LAB — PROTIME-INR
INR: 0.9 (ref 0.8–1.2)
Prothrombin Time: 12.6 seconds (ref 11.4–15.2)

## 2021-06-29 NOTE — ED Triage Notes (Signed)
Pt presents from home via pov with right arm pain. 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 She states she is on eliquis. She also had pfo closure in May and stent in iliac vein. Pt alert & oriented, nad noted. Pt's right arm is very tender to touch.

## 2021-06-29 NOTE — ED Provider Notes (Signed)
[facility] EMERGENCY DEPT Provider Note   CSN: [id number] Arrival date & time: 06/29/21  0814     History Chief Complaint  Patient presents with   Arm Pain    Amber Carroll is a 35 y.o. female.  HPI  35 year old female with past medical history of clotting disorder, protein S, previous DVT/PE anticoagulant Eliquis presents emergency department right upper extremity pain.  Patient states she has been having a soreness in the right upper arm that has become worse.  She wants to be over cautious and get it checked out in regards to DVT given her history.  She states has been compliant with her Eliquis, no recent dose change or illness.  No recent nausea/vomiting.  No arm swelling or discoloration.  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     No family history on file.  Social History   Tobacco Use   Smoking status: Every Day    Packs/day: 0.50    Years: 3.00    Pack years: 1.50    Types: Cigarettes   Smokeless tobacco: Never  Substance Use Topics   Alcohol use: No   Drug use: No    Home Medications Prior to Admission medications   Medication Sig Start Date End Date Taking? Authorizing Provider  ALPRAZolam Prudy Feeler) 0.5 MG tablet Take 0.5 mg by mouth at bedtime as needed for anxiety or sleep.   Yes [provider]  apixaban (ELIQUIS) 5 MG TABS tablet Take 5 mg by mouth 2 (two) times daily.   Yes  [provider]  escitalopram (LEXAPRO) 10 MG tablet Take 10 mg by mouth daily.   Yes [provider]  cetirizine (ZYRTEC ALLERGY) 10 MG tablet Take 1 tablet (10 mg total) by mouth daily. 07/09/14   Arthor Captain, PA-C  hydrOXYzine (ATARAX/VISTARIL) 25 MG tablet Take 1 tablet (25 mg total) by mouth every 6 (six) hours. 07/09/14   Harris, Cammy Copa, PA-C  predniSONE (DELTASONE) 10 MG tablet Take 4 tablets (40 mg total) by mouth daily. 07/09/14   Arthor Captain, PA-C    Allergies    Ivp dye [iodinated contrast media] and Latex  Review of Systems   Review of Systems  Constitutional:  Negative for chills and fever.  HENT:  Negative for congestion.   Respiratory:  Negative for chest tightness and shortness of breath.   Cardiovascular:  Negative for chest pain and leg swelling.  Gastrointestinal:  Negative for abdominal pain, diarrhea and vomiting.  Musculoskeletal:  Negative for back pain and neck pain.       +RUE pain  Skin:  Negative for rash.  Neurological:  Negative for headaches.   Physical Exam Updated Vital Signs BP 111/73    Pulse 66    Temp 99.5 F (37.5 C) (Oral)    Resp 17    Ht 5\' 3"  (1.6 m)    Wt 52.6 kg    LMP  06/04/2021 (Approximate)    SpO2 100%    BMI 20.55 kg/m   Physical Exam Vitals and nursing note reviewed.  Constitutional:      General: She is not in acute distress.    Appearance: Normal appearance.  HENT:     Head: Normocephalic.     Mouth/Throat:     Mouth: Mucous membranes are moist.  Cardiovascular:     Rate and Rhythm: Normal rate.  Pulmonary:     Effort: Pulmonary effort is normal. No respiratory distress.  Abdominal:     Palpations: Abdomen is soft.     Tenderness: There is no abdominal tenderness.  Musculoskeletal:     Comments: Tenderness to palpation along the biceps of the right upper extremity, no swelling, discoloration, equal palpable radial pulses, no weakness, arm is otherwise unremarkable  Skin:    General: Skin is warm.   Neurological:     Mental Status: She is alert and oriented to person, place, and time. Mental status is at baseline.  Psychiatric:        Mood and Affect: Mood normal.    ED Results / Procedures / Treatments   Labs (all labs ordered are listed, but only abnormal results are displayed) Labs Reviewed  BASIC METABOLIC PANEL  CBC  PREGNANCY, URINE  PROTIME-INR    EKG None  Radiology No results found.  Procedures Procedures   Medications Ordered in ED Medications - No data to display  ED Course  I have reviewed the triage vital signs and the nursing notes.  Pertinent labs & imaging results that were available during my care of the patient were reviewed by me and considered in my medical decision making (see chart for details).    MDM Rules/Calculators/A&P                           35 year old female with previous history of DVT/PE and protein S deficiency who is anticoagulant Eliquis presents emergency department right upper extremity pain.  The arm is physically unremarkable on exam.  Vitals are stable.  No symptoms of PE.  She is been compliant with her Eliquis.  Blood work is normal.  Plan for ultrasound of the right upper extremity although lower suspicion for DVT given her compliance with Eliquis.  Her ultrasound is gone for the night.  I called the radiology department and she will return to the radiology department tomorrow between the hours of 10 AM and 5 PM to have an ultrasound done of the right upper extremity.  Patient at this time appears safe and stable for discharge and will be treated as an outpatient.  Discharge plan and strict return to ED precautions discussed, patient verbalizes understanding and agreement.     Final Clinical Impression(s) / ED Diagnoses Final diagnoses:  Pain of right upper extremity    Rx / DC Orders ED Discharge Orders     None        Rozelle Logan, DO 06/29/21 2337

## 2021-06-29 NOTE — Discharge Instructions (Signed)
You have been seen and discharged from the emergency department.  Your blood work was normal.  Is recommended he get an ultrasound of the right upper extremity.  Our ultrasound tech is here tomorrow from 10 AM to 5 PM.  You may show up to the radiology department anytime between those hours to get the ultrasound done, an order has already been placed.  Follow-up with your primary provider for reevaluation and further care. Take home medications as prescribed. If you have any worsening symptoms or further concerns for your health please return to an emergency department for further evaluation.

## 2021-08-03 DIAGNOSIS — F411 Generalized anxiety disorder: Secondary | ICD-10-CM | POA: Diagnosis not present

## 2021-08-03 DIAGNOSIS — R69 Illness, unspecified: Secondary | ICD-10-CM | POA: Diagnosis not present

## 2021-08-03 DIAGNOSIS — N750 Cyst of Bartholin's gland: Secondary | ICD-10-CM | POA: Diagnosis not present

## 2021-08-08 DIAGNOSIS — R69 Illness, unspecified: Secondary | ICD-10-CM | POA: Diagnosis not present

## 2021-08-08 DIAGNOSIS — F411 Generalized anxiety disorder: Secondary | ICD-10-CM | POA: Diagnosis not present

## 2021-08-08 DIAGNOSIS — F330 Major depressive disorder, recurrent, mild: Secondary | ICD-10-CM | POA: Diagnosis not present

## 2021-08-15 ENCOUNTER — Encounter (HOSPITAL_COMMUNITY): Payer: Self-pay | Admitting: Emergency Medicine

## 2021-08-15 ENCOUNTER — Other Ambulatory Visit: Payer: Self-pay

## 2021-08-15 ENCOUNTER — Inpatient Hospital Stay (HOSPITAL_COMMUNITY)
Admission: EM | Admit: 2021-08-15 | Discharge: 2021-08-19 | DRG: 253 | Disposition: A | Discharge status: Home / Self Care | Payer: 59 | Attending: Internal Medicine | Admitting: Internal Medicine

## 2021-08-15 ENCOUNTER — Emergency Department (HOSPITAL_BASED_OUTPATIENT_CLINIC_OR_DEPARTMENT_OTHER): Payer: 59

## 2021-08-15 DIAGNOSIS — Z91040 Latex allergy status: Secondary | ICD-10-CM

## 2021-08-15 DIAGNOSIS — Z79899 Other long term (current) drug therapy: Secondary | ICD-10-CM

## 2021-08-15 DIAGNOSIS — R9431 Abnormal electrocardiogram [ECG] [EKG]: Secondary | ICD-10-CM | POA: Diagnosis not present

## 2021-08-15 DIAGNOSIS — D649 Anemia, unspecified: Secondary | ICD-10-CM | POA: Diagnosis present

## 2021-08-15 DIAGNOSIS — Z20822 Contact with and (suspected) exposure to covid-19: Secondary | ICD-10-CM | POA: Diagnosis present

## 2021-08-15 DIAGNOSIS — T82856A Stenosis of peripheral vascular stent, initial encounter: Principal | ICD-10-CM | POA: Diagnosis present

## 2021-08-15 DIAGNOSIS — M79605 Pain in left leg: Secondary | ICD-10-CM

## 2021-08-15 DIAGNOSIS — D6859 Other primary thrombophilia: Secondary | ICD-10-CM | POA: Diagnosis present

## 2021-08-15 DIAGNOSIS — Z7901 Long term (current) use of anticoagulants: Secondary | ICD-10-CM

## 2021-08-15 DIAGNOSIS — Z91041 Radiographic dye allergy status: Secondary | ICD-10-CM

## 2021-08-15 DIAGNOSIS — Z8774 Personal history of (corrected) congenital malformations of heart and circulatory system: Secondary | ICD-10-CM

## 2021-08-15 DIAGNOSIS — Z86711 Personal history of pulmonary embolism: Secondary | ICD-10-CM

## 2021-08-15 DIAGNOSIS — I8290 Acute embolism and thrombosis of unspecified vein: Secondary | ICD-10-CM

## 2021-08-15 DIAGNOSIS — Z86718 Personal history of other venous thrombosis and embolism: Secondary | ICD-10-CM

## 2021-08-15 DIAGNOSIS — F17210 Nicotine dependence, cigarettes, uncomplicated: Secondary | ICD-10-CM | POA: Diagnosis present

## 2021-08-15 DIAGNOSIS — I871 Compression of vein: Secondary | ICD-10-CM | POA: Diagnosis present

## 2021-08-15 DIAGNOSIS — D689 Coagulation defect, unspecified: Secondary | ICD-10-CM

## 2021-08-15 DIAGNOSIS — Y831 Surgical operation with implant of artificial internal device as the cause of abnormal reaction of the patient, or of later complication, without mention of misadventure at the time of the procedure: Secondary | ICD-10-CM | POA: Diagnosis present

## 2021-08-15 DIAGNOSIS — I82422 Acute embolism and thrombosis of left iliac vein: Secondary | ICD-10-CM | POA: Diagnosis present

## 2021-08-15 DIAGNOSIS — F17200 Nicotine dependence, unspecified, uncomplicated: Secondary | ICD-10-CM | POA: Diagnosis present

## 2021-08-15 LAB — CBC WITH DIFFERENTIAL/PLATELET
Abs Immature Granulocytes: 0.03 10*3/uL (ref 0.00–0.07)
Basophils Absolute: 0.1 10*3/uL (ref 0.0–0.1)
Basophils Relative: 1 %
Eosinophils Absolute: 0.2 10*3/uL (ref 0.0–0.5)
Eosinophils Relative: 2 %
HCT: 34.7 % — ABNORMAL LOW (ref 36.0–46.0)
Hemoglobin: 10.9 g/dL — ABNORMAL LOW (ref 12.0–15.0)
Immature Granulocytes: 0 %
Lymphocytes Relative: 22 %
Lymphs Abs: 1.9 10*3/uL (ref 0.7–4.0)
MCH: 29.1 pg (ref 26.0–34.0)
MCHC: 31.4 g/dL (ref 30.0–36.0)
MCV: 92.8 fL (ref 80.0–100.0)
Monocytes Absolute: 0.7 10*3/uL (ref 0.1–1.0)
Monocytes Relative: 8 %
Neutro Abs: 5.7 10*3/uL (ref 1.7–7.7)
Neutrophils Relative %: 67 %
Platelets: 192 10*3/uL (ref 150–400)
RBC: 3.74 MIL/uL — ABNORMAL LOW (ref 3.87–5.11)
RDW: 14.8 % (ref 11.5–15.5)
WBC: 8.6 10*3/uL (ref 4.0–10.5)
nRBC: 0 % (ref 0.0–0.2)

## 2021-08-15 LAB — BASIC METABOLIC PANEL
Anion gap: 10 (ref 5–15)
BUN: 6 mg/dL (ref 6–20)
CO2: 24 mmol/L (ref 22–32)
Calcium: 9.4 mg/dL (ref 8.9–10.3)
Chloride: 103 mmol/L (ref 98–111)
Creatinine, Ser: 0.89 mg/dL (ref 0.44–1.00)
GFR, Estimated: >60 mL/min (ref >60)
Glucose, Bld: 87 mg/dL (ref 70–99)
Potassium: 3.8 mmol/L (ref 3.5–5.1)
Sodium: 137 mmol/L (ref 135–145)

## 2021-08-15 LAB — I-STAT BETA HCG BLOOD, ED (MC, WL, AP ONLY): I-stat hCG, quantitative: <5 m[IU]/mL (ref <5)

## 2021-08-15 MED ORDER — SODIUM CHLORIDE 0.9% FLUSH
3.0000 mL | Freq: Two times a day (BID) | INTRAVENOUS | Status: DC
  Administered 2021-08-16: 3 mL via INTRAVENOUS

## 2021-08-15 MED ORDER — APIXABAN 5 MG PO TABS
5.0000 mg | ORAL_TABLET | Freq: Two times a day (BID) | ORAL | Status: DC
  Administered 2021-08-16 (×2): 5 mg via ORAL
  Filled 2021-08-15 (×2): qty 1

## 2021-08-15 MED ORDER — BUSPIRONE HCL 10 MG PO TABS
15.0000 mg | ORAL_TABLET | Freq: Every day | ORAL | Status: DC
  Administered 2021-08-16 – 2021-08-19 (×4): 15 mg via ORAL
  Filled 2021-08-15: qty 3
  Filled 2021-08-15 (×3): qty 2

## 2021-08-15 MED ORDER — KETOROLAC TROMETHAMINE 30 MG/ML IJ SOLN
30.0000 mg | Freq: Once | INTRAMUSCULAR | Status: AC
  Administered 2021-08-15: 30 mg via INTRAVENOUS
  Filled 2021-08-15: qty 1

## 2021-08-15 MED ORDER — ESCITALOPRAM OXALATE 10 MG PO TABS
10.0000 mg | ORAL_TABLET | Freq: Every day | ORAL | Status: DC
Start: 2021-08-16 — End: 2021-08-19
  Administered 2021-08-16 – 2021-08-19 (×4): 10 mg via ORAL
  Filled 2021-08-15 (×4): qty 1

## 2021-08-15 MED ORDER — SODIUM CHLORIDE 0.9 % IV SOLN: 250.0000 mL | INTRAVENOUS | Status: DC | PRN

## 2021-08-15 MED ORDER — ATOMOXETINE HCL 40 MG PO CAPS
40.0000 mg | ORAL_CAPSULE | Freq: Every morning | ORAL | Status: DC
  Administered 2021-08-16 – 2021-08-19 (×4): 40 mg via ORAL
  Filled 2021-08-15 (×5): qty 1

## 2021-08-15 MED ORDER — ACETAMINOPHEN 650 MG RE SUPP: 650.0000 mg | Freq: Four times a day (QID) | RECTAL | Status: DC | PRN

## 2021-08-15 MED ORDER — ACETAMINOPHEN 325 MG PO TABS: 650.0000 mg | ORAL_TABLET | Freq: Four times a day (QID) | ORAL | Status: DC | PRN

## 2021-08-15 MED ORDER — DIPHENHYDRAMINE HCL 50 MG/ML IJ SOLN
50.0000 mg | Freq: Once | INTRAMUSCULAR | Status: AC
  Administered 2021-08-16: 50 mg via INTRAVENOUS
  Filled 2021-08-15: qty 1

## 2021-08-15 MED ORDER — SODIUM CHLORIDE 0.9% FLUSH: 3.0000 mL | INTRAVENOUS | Status: DC | PRN

## 2021-08-15 MED ORDER — TRAMADOL HCL 50 MG PO TABS
50.0000 mg | ORAL_TABLET | Freq: Four times a day (QID) | ORAL | Status: DC | PRN
  Administered 2021-08-17 – 2021-08-18 (×2): 50 mg via ORAL
  Filled 2021-08-15 (×2): qty 1

## 2021-08-15 MED ORDER — PREDNISONE 20 MG PO TABS
50.0000 mg | ORAL_TABLET | Freq: Three times a day (TID) | ORAL | Status: DC
  Administered 2021-08-16 – 2021-08-19 (×10): 50 mg via ORAL
  Filled 2021-08-15: qty 3
  Filled 2021-08-15: qty 1
  Filled 2021-08-15: qty 3
  Filled 2021-08-15 (×2): qty 1
  Filled 2021-08-15: qty 2
  Filled 2021-08-15 (×2): qty 1
  Filled 2021-08-15: qty 2
  Filled 2021-08-15: qty 3

## 2021-08-15 MED ORDER — TRAZODONE HCL 50 MG PO TABS
100.0000 mg | ORAL_TABLET | Freq: Every day | ORAL | Status: DC
  Administered 2021-08-16 – 2021-08-18 (×4): 100 mg via ORAL
  Filled 2021-08-15 (×4): qty 2

## 2021-08-15 NOTE — Assessment & Plan Note (Signed)
Nicotine patch provided to prevent withdrawls. Advised to discontinue smoking. Smoking cessation education to be provided before discharge

## 2021-08-15 NOTE — ED Triage Notes (Signed)
Patient c/o left leg numbness and pain x 1 week. States leg going completely numb when walking and intermittently when sitting. Also having swelling to left leg for past 4 days. Takes eliquis. Hx of blood clots.

## 2021-08-15 NOTE — ED Provider Triage Note (Signed)
Emergency Medicine Provider Triage Evaluation Note  Amber Carroll , a 36 y.o. female  was evaluated in triage.  Pt complains of left lower extremity swelling and tingling.  Started about a week ago, its been progressively worsening.  History of DVTs, on Eliquis with no missed doses..  Review of Systems  Positive: LL PAIN Negative: SOB, CP  Physical Exam  BP 93/64    Pulse (!) 113    Temp 98.4 F (36.9 C)    Resp 18    Ht 5\' 3"  (1.6 m)    Wt 54.4 kg    LMP 08/14/2021    SpO2 100%    BMI 21.26 kg/m  Gen:   Awake, no distress   Resp:  Normal effort  MSK:   Moves extremities without difficulty  Other:  DP PT 2+.  Edema to the left lower extremity.  Tenderness posterior to the left kneecap  Medical Decision Making  Medically screening exam initiated at 5:04 PM.  Appropriate orders placed.  Nafisa Olds was informed that the remainder of the evaluation will be completed by another provider, this initial triage assessment does not replace that evaluation, and the importance of remaining in the ED until their evaluation is complete.     Cyndia Bent, PA-C 08/15/21 1707

## 2021-08-15 NOTE — Assessment & Plan Note (Signed)
Is on Eliquis 5 mg bid which is continued.

## 2021-08-15 NOTE — H&P (Signed)
History and Physical    Patient: Amber Carroll [id number] DOB: 1985/10/06 DOA: 08/15/2021 DOS: the patient was seen and examined on 08/15/2021 PCP: Su Grand, MD  Patient coming from: Home  Chief Complaint: No chief complaint on file.   HPI: Melora Prawdzik is a 36 y.o. 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  Has a history of PE and possible protein S deficiency.  Previous vascular procedures were done in [place].  She is currently on Eliquis and has not missed any doses.  She reports increased pain in her left groin and left upper leg for the last few days.  She states it feels just like it did when she had the previous blood clot.  She has not had any chest pain or shortness of breath.  She denies any fever, chills, abdominal pain nausea vomiting diarrhea she does have a history of IV dye and needs to have prep before getting IV contrast for CT scan.  She reports that when she had IV contrast when she was a teenager she developed a rash and swelling and itchiness in her throat.  She has not had any injury or trauma to her abdomen or legs.  She denies any recent prolonged travel or immobilization She does smoke 5 to 6 cigarettes a day.  Denies illicit drug use.  Review of Systems: As mentioned in the history of present illness. All other systems reviewed and are negative. Past Medical History:  Diagnosis Date   Pulmonary embolism ([place]) 01/2009   Past Surgical History:  Procedure Laterality Date   PATENT FORAMEN OVALE(PFO) CLOSURE     Social History:  reports that she has been smoking cigarettes. She has a 1.50 pack-year smoking history. She has never used smokeless tobacco. She reports that she does not drink alcohol and does not use drugs.  Allergies  Allergen Reactions   Ivp Dye [Iodinated Contrast Media]    Latex     History reviewed. No  pertinent family history.  Prior to Admission medications   Medication Sig Start Date End Date Taking? Authorizing Provider  apixaban (ELIQUIS) 5 MG TABS tablet Take 5 mg by mouth 2 (two) times daily.   Yes [provider]  atomoxetine (STRATTERA) 40 MG capsule Take 40 mg by mouth every morning. 08/08/21  Yes [provider]  busPIRone (BUSPAR) 15 MG tablet Take 15 mg by mouth daily. 08/14/21  Yes [provider]  cetirizine (ZYRTEC ALLERGY) 10 MG tablet Take 1 tablet (10 mg total) by mouth daily. 07/09/14  Yes Harris, Abigail, PA-C  escitalopram (LEXAPRO) 20 MG tablet Take 10 mg by mouth daily.   Yes [provider]  traZODone (DESYREL) 100 MG tablet Take 100 mg by mouth at bedtime. 08/08/21  Yes [provider]  ALPRAZolam Duanne Moron) 0.5 MG tablet Take 0.5 mg by mouth at bedtime as needed for anxiety or sleep. Patient not taking: Reported on 08/15/2021    [provider]  hydrOXYzine (ATARAX/VISTARIL) 25 MG tablet Take 1 tablet (25 mg total) by mouth every 6 (six) hours. Patient not taking: Reported on 08/15/2021 07/09/14   Margarita Mail, PA-C  predniSONE (DELTASONE) 10 MG tablet Take 4 tablets (40 mg total) by mouth daily. Patient not taking: Reported on 08/15/2021 07/09/14   Margarita Mail, PA-C    Physical Exam: Vitals:   08/15/21 1658 08/15/21 1804 08/15/21 1915 08/15/21 2000  BP:  93/60 97/70 91/65   Pulse:  86 75 77  Resp:  18 14 15   Temp:      SpO2:  100% 100% 98%  Weight: 54.4 kg     Height: 5\' 3"  (1.6 m)      General: WDWN, Alert and oriented x3.  Eyes: EOMI, PERRL, conjunctivae normal.  Sclera nonicteric HENT:  /AT, external ears normal.  Nares patent without epistasis.  Mucous membranes are moist. Neck: Soft, normal range of motion, supple, no masses, Trachea midline Respiratory: clear to auscultation bilaterally, no wheezing, no crackles. Normal respiratory effort. No accessory muscle use.  Cardiovascular: Regular rate and  rhythm, no murmurs / rubs / gallops. Mild left lower extremity edema. 2+ pedal pulses bilaterally.  Abdomen: Soft, LLQ tenderness, nondistended, no rebound or guarding.  No masses palpated. Bowel sounds normoactive Musculoskeletal: FROM. no cyanosis. No joint deformity upper and lower extremities. Normal muscle tone.  Skin: Warm, dry, intact no rashes, lesions, ulcers. No induration Neurologic: CN 2-12 grossly intact.  Normal speech.  Sensation intact,Strength 5/5 in all extremities.   Psychiatric: Normal judgment and insight.  Normal mood.    Data Reviewed Lab work:   WBC 8600 hemoglobin 10.9 hematocrit 34.7 platelets 190,000 sodium 137 potassium 3-3.8 chloride 103 bicarb 24 creatinine 0.89 BUN 6 glucose 87 calcium 9.4 beta-hCG negative   Vascular ultrasound shows no DVT on the right or the left  Assessment and Plan: * Left leg pain- (present on admission) Ms. Ludwig is placed on Med Surg for observation. She has hx of clotting disorder with previous left iliac vein thrombus that required intervention last year. States this pain feels the same as it did then. Obtain MR venogram. If thrombus identified will have vascular surgery consult.  Pain control provided. Given toradol IV one time in ER.   Blood clotting disorder (HCC) Is on Eliquis 5 mg bid which is continued.  TOBACCO ABUSE- (present on admission) Nicotine patch provided to prevent withdrawls. Advised to discontinue smoking. Smoking cessation education to be provided before discharge   Advance Care Planning: CODE STATUS: Full code   patient is anticoagulated Eliquis which will be continued  Family Communication: Diagnosis and plan discussed with patient and her father who is at bedside.  Both verbalized understanding agree with plan.  Further recommendation to follow as clinically indicated  Author: Eben Burow, MD 08/15/2021 10:14 PM  For on call review [email or web address].

## 2021-08-15 NOTE — Assessment & Plan Note (Signed)
Ms. Perotti is placed on Med Surg for observation. She has hx of clotting disorder with previous left iliac vein thrombus that required intervention last year. States this pain feels the same as it did then. Obtain MR venogram. If thrombus identified will have vascular surgery consult.  Pain control provided. Given toradol IV one time in ER.

## 2021-08-15 NOTE — ED Provider Notes (Signed)
[facility] EMERGENCY DEPARTMENT Provider Note   CSN: [id number] Arrival date & time: 08/15/21  1613     History  No chief complaint on file.   Amber Carroll is a 36 y.o. female.  HPI Patient with reported protein see deficiency.  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  Had previous mechanical thrombectomy and then stent of left common iliac and common femoral vein for large clot.  Had been done in [place].  Is on Eliquis.  States she has not missed doses.  However for the last almost week has had increased pain in the groin and going down the leg.  Like previous pain and numbness she was having when she had the clot.  Reviewing records it appears as if she was post to have an invasive venogram but was unable to do.  No chest pain.  No trouble breathing.  Had previous pulmonary embolismthis does not feel like that.  Does have a history of IV dye allergy and states she has had to get to treatment before getting scans.  States she had a rash in her throat will get itchy and swell.  States left leg is more swollen now 2.  Pain with palpation in the left groin.   Past Medical History:  Diagnosis Date   Pulmonary embolism ([place]) 01/2009   Past Surgical History:  Procedure Laterality Date   PATENT FORAMEN OVALE(PFO) CLOSURE       Home Medications Prior to Admission medications   Medication Sig Start Date End Date Taking? Authorizing Provider  ALPRAZolam Duanne Moron) 0.5 MG tablet Take 0.5 mg by mouth at bedtime as needed for anxiety or sleep.    [provider]  apixaban (ELIQUIS) 5 MG TABS tablet Take 5 mg by mouth 2 (two) times daily.    [provider]  cetirizine (ZYRTEC ALLERGY) 10 MG tablet Take 1 tablet (10 mg total) by mouth daily. 07/09/14   Harris, Abigail, PA-C  escitalopram (LEXAPRO) 10 MG tablet Take 10 mg by mouth daily.    [provider]  hydrOXYzine (ATARAX/VISTARIL) 25 MG tablet Take 1 tablet (25 mg total) by mouth every 6 (six) hours.  07/09/14   Harris, Vernie Shanks, PA-C  predniSONE (DELTASONE) 10 MG tablet Take 4 tablets (40 mg total) by mouth daily. 07/09/14   Margarita Mail, PA-C      Allergies    Ivp dye [iodinated contrast media] and Latex    Review of Systems   Review of Systems  Constitutional:  Negative for appetite change.  Respiratory:  Negative for shortness of breath.   Cardiovascular:  Positive for leg swelling.  Neurological:  Positive for numbness.   Physical Exam Updated Vital Signs BP 91/65    Pulse 77    Temp 98.4 F (36.9 C)    Resp 15    Ht 5\' 3"  (1.6 m)    Wt 54.4 kg    LMP 08/14/2021    SpO2 98%    BMI 21.26 kg/m  Physical Exam Vitals and nursing note reviewed.  Eyes:     Pupils: Pupils are equal, round, and reactive to light.  Pulmonary:     Breath sounds: No wheezing.  Abdominal:     Tenderness: There is no abdominal tenderness.  Musculoskeletal:        General: Tenderness present.     Comments: Tenderness to right groin.  Mild edema on left lower extremity.  Pulse intact on dorsalis pedis.  Tenderness on left groin also.  Neurological:  Mental Status: She is alert.    ED Results / Procedures / Treatments   Labs (all labs ordered are listed, but only abnormal results are displayed) Labs Reviewed  CBC WITH DIFFERENTIAL/PLATELET - Abnormal; Notable for the following components:      Result Value   RBC 3.74 (*)    Hemoglobin 10.9 (*)    HCT 34.7 (*)    All other components within normal limits  BASIC METABOLIC PANEL  I-STAT BETA HCG BLOOD, ED ([facility], [facility], [facility] ONLY)    EKG EKG Interpretation  Date/Time:  Tuesday August 15 2021 19:12:38 EST Ventricular Rate:  77 PR Interval:  136 QRS Duration: 86 QT Interval:  369 QTC Calculation: 418 R Axis:   64 Text Interpretation: Sinus rhythm Confirmed by Davonna Belling [phone]) on 08/15/2021 7:13:44 PM  Radiology VAS [place] LOWER EXTREMITY VENOUS (DVT) (7a-7p)  Result Date: 08/15/2021  Lower Venous DVT Study Patient Name:  Amber Carroll  Date of Exam:   08/15/2021 Medical Rec #: [id number]        Accession #:    [id number] Date of Birth: Aug 02, 1985         Patient Gender: F Patient Age:   44 years Exam Location:  [facility] Procedure:      VAS [place] LOWER EXTREMITY VENOUS (DVT) Referring Phys: HALEY SAGE --------------------------------------------------------------------------------  Indications: Pain.  Comparison Study: no prior Performing Technologist: Archie Patten RVS  Examination Guidelines: A complete evaluation includes B-mode imaging, spectral Doppler, color Doppler, and power Doppler as needed of all accessible portions of each vessel. Bilateral testing is considered an integral part of a complete examination. Limited examinations for reoccurring indications may be performed as noted. The reflux portion of the exam is performed with the patient in reverse Trendelenburg.  +-----+---------------+---------+-----------+----------+--------------+  RIGHT Compressibility Phasicity Spontaneity Properties Thrombus Aging  +-----+---------------+---------+-----------+----------+--------------+  CFV   Full            Yes                                              +-----+---------------+---------+-----------+----------+--------------+   +---------+---------------+---------+-----------+----------+--------------+  LEFT      Compressibility Phasicity Spontaneity Properties Thrombus Aging  +---------+---------------+---------+-----------+----------+--------------+  CFV       Full            Yes       Yes                                    +---------+---------------+---------+-----------+----------+--------------+  SFJ       Full                                                             +---------+---------------+---------+-----------+----------+--------------+  FV Prox   Full                                                             +---------+---------------+---------+-----------+----------+--------------+  FV  Mid    Full                                                              +---------+---------------+---------+-----------+----------+--------------+  FV Distal Full                                                             +---------+---------------+---------+-----------+----------+--------------+  PFV       Full                                                             +---------+---------------+---------+-----------+----------+--------------+  POP       Full            Yes       Yes                                    +---------+---------------+---------+-----------+----------+--------------+  PTV       Full                                                             +---------+---------------+---------+-----------+----------+--------------+  PERO      Full                                                             +---------+---------------+---------+-----------+----------+--------------+     Summary: RIGHT: - No evidence of common femoral vein obstruction.  LEFT: - There is no evidence of deep vein thrombosis in the lower extremity.  - No cystic structure found in the popliteal fossa.  *See table(s) above for measurements and observations.    Preliminary     Procedures Procedures    Medications Ordered in ED Medications - No data to display  ED Course/ Medical Decision Making/ A&P                           Medical Decision Making Amount and/or Complexity of Data Reviewed Independent Historian:     Details: parent External Data Reviewed: notes. Labs:  Decision-making details documented in ED Course. Radiology: ordered. Decision-making details documented in ED Course. ECG/medicine tests: independent interpretation performed. Decision-making details documented in ED Course. Discussion of management or test interpretation with external provider(s): Dr Scot Dock. Vascular surgery  Risk Decision regarding hospitalization.   Initial differential diagnosis for left thigh pain with previous DVT includes DVT,  musculoskeletal pain.  Patient presents with pain in her left thigh  and groin.  Also numbness at times.  Similar pain when she has had a previous large DVT.  Required mechanical thrombectomy followed by stenting months later.  This was done in [place].  Was post to have a follow-up invasive venogram that was not done.  Over the last week increased pain.  Reportedly is hypercoagulable.  Is on Eliquis.  No chest pain or trouble breathing.  However worsening swelling and pain in the leg.  Worse with movements.  States it is the same pain than when she had a clot.  DVT Doppler done and was reassuring although can only see common femoral.  I think patient benefit from CT venogram, however she does have a severe reaction to dye.  States she is been able to do it in the past with premedication.  I think she would benefit from a longer premedication followed by CT venogram.  Discussed with Dr. Scot Dock from vascular surgery and we agree with this plan.  He can be consulted tomorrow if there is clot.  If failed on the Eliquis may require subcu Lovenox treatment.        Final Clinical Impression(s) / ED Diagnoses Final diagnoses:  Lower extremity pain, central, left    Rx / DC Orders ED Discharge Orders     None         Davonna Belling, MD 08/15/21 2046

## 2021-08-15 NOTE — Progress Notes (Signed)
Lower extremity venous has been completed.   Preliminary results in CV Proc.   Aundra Millet Destane Speas 08/15/2021 5:43 PM

## 2021-08-16 ENCOUNTER — Observation Stay (HOSPITAL_COMMUNITY): Payer: 59

## 2021-08-16 DIAGNOSIS — R202 Paresthesia of skin: Secondary | ICD-10-CM | POA: Diagnosis not present

## 2021-08-16 DIAGNOSIS — R2243 Localized swelling, mass and lump, lower limb, bilateral: Secondary | ICD-10-CM

## 2021-08-16 DIAGNOSIS — M79605 Pain in left leg: Secondary | ICD-10-CM | POA: Diagnosis not present

## 2021-08-16 DIAGNOSIS — M7989 Other specified soft tissue disorders: Secondary | ICD-10-CM

## 2021-08-16 DIAGNOSIS — T82868A Thrombosis of vascular prosthetic devices, implants and grafts, initial encounter: Secondary | ICD-10-CM

## 2021-08-16 DIAGNOSIS — T82818A Embolism of vascular prosthetic devices, implants and grafts, initial encounter: Secondary | ICD-10-CM

## 2021-08-16 DIAGNOSIS — Z95820 Peripheral vascular angioplasty status with implants and grafts: Secondary | ICD-10-CM

## 2021-08-16 DIAGNOSIS — Z86718 Personal history of other venous thrombosis and embolism: Secondary | ICD-10-CM

## 2021-08-16 LAB — CBC
HCT: 33.8 % — ABNORMAL LOW (ref 36.0–46.0)
Hemoglobin: 10.8 g/dL — ABNORMAL LOW (ref 12.0–15.0)
MCH: 29.3 pg (ref 26.0–34.0)
MCHC: 32 g/dL (ref 30.0–36.0)
MCV: 91.8 fL (ref 80.0–100.0)
Platelets: 172 10*3/uL (ref 150–400)
RBC: 3.68 MIL/uL — ABNORMAL LOW (ref 3.87–5.11)
RDW: 14.9 % (ref 11.5–15.5)
WBC: 6.9 10*3/uL (ref 4.0–10.5)
nRBC: 0 % (ref 0.0–0.2)

## 2021-08-16 LAB — RESP PANEL BY RT-PCR (FLU A&B, COVID) ARPGX2
Influenza A by PCR: NEGATIVE
Influenza B by PCR: NEGATIVE
SARS Coronavirus 2 by RT PCR: NEGATIVE

## 2021-08-16 LAB — HIV ANTIBODY (ROUTINE TESTING W REFLEX): HIV Screen 4th Generation wRfx: NONREACTIVE

## 2021-08-16 MED ORDER — HEPARIN (PORCINE) 25000 UT/250ML-% IV SOLN
1000.0000 [IU]/h | INTRAVENOUS | Status: DC
  Administered 2021-08-16: 900 [IU]/h via INTRAVENOUS
  Filled 2021-08-16 (×2): qty 250

## 2021-08-16 MED ORDER — SODIUM CHLORIDE 0.9 % IV SOLN: INTRAVENOUS | Status: DC

## 2021-08-16 MED ORDER — IOHEXOL 350 MG/ML SOLN
100.0000 mL | Freq: Once | INTRAVENOUS | Status: AC | PRN
  Administered 2021-08-16: 100 mL via INTRAVENOUS

## 2021-08-16 NOTE — ED Notes (Signed)
Breakfast Orders Placed °

## 2021-08-16 NOTE — Consult Note (Addendum)
ASSESSMENT & PLAN   HISTORY OF LEFT LOWER EXTREMITY DVT: This patient is undergone previous mechanical thrombectomy for a left iliac DVT.  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  Venous duplex scan of the left lower extremity is unremarkable.  She has a dye allergy and therefore the plan is for her to be premedicated for a CT venogram to evaluate her iliac veins.  If she is found to have clot in the common or external iliac vein consideration could be given to thrombolysis or potentially mechanical thrombectomy.  I think there would be some risk of mechanical thrombectomy given her iliac stent.  If she has had a clot despite Eliquis consideration could be given to placing her on subcu Lovenox. If the CT venogram does not show any evidence of clot then she may need further work-up for a neurogenic cause for her symptoms.   ADDENDUM: I have reviewed the CT venogram that was done this afternoon.  This shows clot in her iliac stent on the left.  I have held her Eliquis and will start intravenous heparin.  We will schedule her for mechanical thrombectomy of her stent and possible intervention on Friday.  Cari Caraway, MD 4:38 PM   REASON FOR CONSULT:    Left leg pain and swelling with history of left lower extremity DVT.  The consult is requested by Dr. Rubin Payor.  HPI:   Laurin Sawatzky is a 36 y.o. female who presented to the emergency department with a 4-day history of left lower extremity paresthesias when walking and some mild left leg swelling.  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  She had a left lower extremity DVT in February 2022.  She underwent mechanical thrombectomy in [facility] by Dr. Katheran Awe with interventional radiology.  She returned in May and underwent a venogram and required placement of an iliac stent reportedly for a May Thurner  syndrome.  She was scheduled for a follow-up venogram in December but because of financial concerns the patient did not proceed with this.  She has been maintained on Eliquis since then original DVT in February.  She has not missed any doses.  She describes her symptoms as her leg becomes numb when she walks.  She is also noted some mild swelling.  She states these were the same symptoms she had when she had a DVT previously.  I do not get any history of claudication.  She denies any back pain.  She denies any trauma to her leg.  Her only real risk factor for peripheral arterial disease is smoking.  She smokes 4 to 5 cigarettes a day.  She denies any history of diabetes, hypertension, hypercholesterolemia, or family history of premature cardiovascular disease.  Past Medical History:  Diagnosis Date   Pulmonary embolism (HCC) 01/2009    History reviewed. No pertinent family history.  SOCIAL HISTORY: Social History   Tobacco Use   Smoking status: Every Day    Packs/day: 0.50    Years: 3.00    Pack years: 1.50    Types: Cigarettes   Smokeless tobacco: Never  Substance Use Topics   Alcohol use: No    Allergies  Allergen Reactions   Ivp Dye [Iodinated Contrast Media]    Latex     Current Facility-Administered Medications  Medication Dose Route Frequency Provider Last Rate Last Admin  0.9 %  sodium chloride infusion  250 mL Intravenous PRN Chotiner, Claudean Severance, MD       acetaminophen (TYLENOL) tablet 650 mg  650 mg Oral Q6H PRN Chotiner, Claudean Severance, MD       Or   acetaminophen (TYLENOL) suppository 650 mg  650 mg Rectal Q6H PRN Chotiner, Claudean Severance, MD       apixaban Everlene Balls) tablet 5 mg  5 mg Oral BID Chotiner, Claudean Severance, MD   5 mg at 08/16/21 0005   atomoxetine (STRATTERA) capsule 40 mg  40 mg Oral q morning Chotiner, Claudean Severance, MD       busPIRone (BUSPAR) tablet 15 mg  15 mg Oral Daily Chotiner, Claudean Severance, MD       escitalopram (LEXAPRO) tablet 10 mg  10 mg Oral Daily Chotiner,  Claudean Severance, MD       predniSONE (DELTASONE) tablet 50 mg  50 mg Oral TID Chotiner, Claudean Severance, MD   50 mg at 08/16/21 0005   sodium chloride flush (NS) 0.9 % injection 3 mL  3 mL Intravenous Q12H Chotiner, Claudean Severance, MD       sodium chloride flush (NS) 0.9 % injection 3 mL  3 mL Intravenous PRN Chotiner, Claudean Severance, MD       traMADol Janean Sark) tablet 50 mg  50 mg Oral Q6H PRN Chotiner, Claudean Severance, MD       traZODone (DESYREL) tablet 100 mg  100 mg Oral QHS Chotiner, Claudean Severance, MD   100 mg at 08/16/21 0005   Current Outpatient Medications  Medication Sig Dispense Refill   apixaban (ELIQUIS) 5 MG TABS tablet Take 5 mg by mouth 2 (two) times daily.     atomoxetine (STRATTERA) 40 MG capsule Take 40 mg by mouth every morning.     busPIRone (BUSPAR) 15 MG tablet Take 15 mg by mouth daily.     cetirizine (ZYRTEC ALLERGY) 10 MG tablet Take 1 tablet (10 mg total) by mouth daily. 30 tablet 1   escitalopram (LEXAPRO) 20 MG tablet Take 10 mg by mouth daily.     traZODone (DESYREL) 100 MG tablet Take 100 mg by mouth at bedtime.     ALPRAZolam (XANAX) 0.5 MG tablet Take 0.5 mg by mouth at bedtime as needed for anxiety or sleep. (Patient not taking: Reported on 08/15/2021)     hydrOXYzine (ATARAX/VISTARIL) 25 MG tablet Take 1 tablet (25 mg total) by mouth every 6 (six) hours. (Patient not taking: Reported on 08/15/2021) 12 tablet 0   predniSONE (DELTASONE) 10 MG tablet Take 4 tablets (40 mg total) by mouth daily. (Patient not taking: Reported on 08/15/2021) 20 tablet 0    REVIEW OF SYSTEMS:  [X]  denotes positive finding, [ ]  denotes negative finding Cardiac  Comments:  Chest pain or chest pressure:    Shortness of breath upon exertion:    Short of breath when lying flat:    Irregular heart rhythm:        Vascular    Pain in calf, thigh, or hip brought on by ambulation:    Pain in feet at night that wakes you up from your sleep:     Blood clot in your veins:    Leg swelling:  x       Pulmonary    Oxygen  at home:    Productive cough:     Wheezing:         Neurologic    Sudden weakness in arms or legs:  Sudden numbness in arms or legs:  x   Sudden onset of difficulty speaking or slurred speech:    Temporary loss of vision in one eye:     Problems with dizziness:         Gastrointestinal    Blood in stool:     Vomited blood:         Genitourinary    Burning when urinating:     Blood in urine:        Psychiatric    Major depression:         Hematologic    Bleeding problems:    Problems with blood clotting too easily:        Skin    Rashes or ulcers:        Constitutional    Fever or chills:    -  PHYSICAL EXAM:   Vitals:   08/16/21 0230 08/16/21 0430 08/16/21 0515 08/16/21 0530  BP: 99/61 100/64 95/61 94/60   Pulse: 82 78 70 69  Resp: (!) 23 13 13 12   Temp:      SpO2: 98% 97% 96% 97%  Weight:      Height:       Body mass index is 21.26 kg/m. GENERAL: The patient is a well-nourished female, in no acute distress. The vital signs are documented above. CARDIAC: There is a regular rate and rhythm.  VASCULAR: I do not detect carotid bruits. On the right side she has a palpable femoral, dorsalis pedis, and posterior tibial pulse. On the left side she has a palpable femoral and dorsalis pedis pulse. She has minimal left lower extremity swelling. PULMONARY: There is good air exchange bilaterally without wheezing or rales. ABDOMEN: Soft and non-tender with normal pitched bowel sounds.  MUSCULOSKELETAL: There are no major deformities. NEUROLOGIC: No focal weakness or paresthesias are detected. SKIN: There are no ulcers or rashes noted. PSYCHIATRIC: The patient has a normal affect.  DATA:    VENOUS DUPLEX: I have independently interpreted her venous duplex scan that was done yesterday.  There was no evidence of DVT in the left lower extremity from the common femoral vein down to the calf.  The iliac vessels were not evaluated.  LABS: Her creatinine is 0.89 with a  GFR greater than 60.  [facility]

## 2021-08-16 NOTE — ED Notes (Signed)
Pt ambulatory to bathroom

## 2021-08-16 NOTE — Progress Notes (Addendum)
° ° °  Subjective  -   Patient complains of left leg paresthesias and mild swelling.  CT scan shows prior iliac stent with occlusive thrombus.   Physical Exam:  Mild left leg edema No shortness of breath Abdomen is soft  Assessment/Plan:  \  Recurrent left leg DVT: The patient has a history of DVT approximately 10 years ago for which she was treated with anticoagulation and diagnosed with protein C or S deficiency.  She was taken off of her anticoagulation.  She had a recurrent DVT in February 2022 treated with mechanical thrombectomy.  This recurred in May and she had stenting for May Thurner syndrome.  She has been on Eliquis since that time.  She now has a 4-day history of left lower extremity paresthesias with some swelling.  CT scan shows occlusive thrombus within her stent.  I discussed proceeding with placing a lytics catheter tomorrow and likely thrombectomy to clean out the clot the following day.  The risks and benefits of this procedure were discussed with the patient and she wishes to proceed.  She will need hematology involvement to determine the appropriate method of anticoagulation moving forward.  I told her we would evaluate her with IVUS to determine if there is anything within the stents that could potentially be causing this, such as a fracture.  She will be n.p.o. after midnight.  We discussed doing this through a left popliteal approach.  I will check a pregnancy test.  She will be started on a heparin drip, and have her Eliquis on hold  Wells Kamee Bobst 08/16/2021 6:05 PM --  Vitals:   08/16/21 1730 08/16/21 1803  BP: 101/69 103/77  Pulse: 75 74  Resp: 16 16  Temp:  98.3 F (36.8 C)  SpO2: 98% 97%   No intake or output data in the 24 hours ending 08/16/21 1805   Laboratory CBC    Component Value Date/Time   WBC 6.9 08/16/2021 0645   HGB 10.8 (L) 08/16/2021 0645   HGB 12.5 08/26/2009 1213   HCT 33.8 (L) 08/16/2021 0645   HCT 38.7 08/26/2009 1213   PLT 172  08/16/2021 0645   PLT 184 08/26/2009 1213    BMET    Component Value Date/Time   NA 137 08/15/2021 1713   K 3.8 08/15/2021 1713   CL 103 08/15/2021 1713   CO2 24 08/15/2021 1713   GLUCOSE 87 08/15/2021 1713   BUN 6 08/15/2021 1713   CREATININE 0.89 08/15/2021 1713   CALCIUM 9.4 08/15/2021 1713   GFRNONAA >60 08/15/2021 1713   GFRAA  02/07/2009 2314    >60        The eGFR has been calculated using the MDRD equation. This calculation has not been validated in all clinical situations. eGFR's persistently <60 mL/min signify possible Chronic Kidney Disease.    COAG Lab Results  Component Value Date   INR 0.9 06/29/2021   INR 1.00 (L) 08/26/2009   INR 0.99 05/31/2009   PROTIME 12.0 08/26/2009   PROTIME 19.2 (H) 05/17/2009   PROTIME 18.0 (H) 02/23/2009   No results found for: PTT  Antibiotics Anti-infectives (From admission, onward)    None        V. Leia Alf, M.D., [facility] [place] Office: [phone] Pager:  [phone]

## 2021-08-16 NOTE — Progress Notes (Signed)
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  She denied chest pain or shortness of breath.  She has a history of allergic reaction to IV contrast dye described as swelling and itching of the throat.  She admits to smoking 5-6 cigarettes a day.  Consultants:  Vascular Surgery  Code Status: FULL CODE  Antimicrobials:  None  DVT prophylaxis: Eliquis  Interim Hx: Afebrile.  Vital signs stable.  Assessment & Plan:  Left leg DVT -clotting disorder No evidence of left leg DVT on venous duplex 2/14 but iliac vessels not evaluated -venogram notes apparent occlusion of iliac stent -plan per vascular surgery  Tobacco abuse Counseled on absolute need to discontinue tobacco abuse and direct connection to clotting disorders  Normocytic anemia No evidence of gross blood loss -monitor trend   Family Communication: Spoke with mother at bedside Disposition: From home -anticipate discharge home after correction of iliac thrombosis  Objective: Blood pressure 92/60, pulse 64, temperature 98.4 F (36.9 C), resp. rate 15, height 5\' 3"  (1.6 m), weight 54.4 kg, last menstrual period 08/14/2021, SpO2 97 %. No intake or output data in the 24 hours ending 08/16/21 0853 Filed Weights   08/15/21 1658  Weight: 54.4 kg    Examination: General: No acute respiratory distress Lungs: Clear to auscultation bilaterally without wheezes or crackles Cardiovascular: Regular rate and rhythm without murmur gallop or rub normal S1 and S2 Abdomen: Nontender, nondistended, soft, bowel sounds positive, no rebound, no ascites, no  appreciable mass Extremities: No significant cyanosis, clubbing, or edema bilateral lower extremities  CBC: Recent Labs  Lab 08/15/21 1713 08/16/21 0645  WBC 8.6 6.9  NEUTROABS 5.7  --   HGB 10.9* 10.8*  HCT 34.7* 33.8*  MCV 92.8 91.8  PLT 192 172   Basic Metabolic Panel: Recent Labs  Lab 08/15/21 1713  NA 137  K 3.8  CL 103  CO2 24  GLUCOSE 87  BUN 6  CREATININE 0.89  CALCIUM 9.4   GFR: Estimated Creatinine Clearance: 73 mL/min (by C-G formula based on SCr of 0.89 mg/dL).   Scheduled Meds:  apixaban  5 mg Oral BID   atomoxetine  40 mg Oral q morning   busPIRone  15 mg Oral Daily   escitalopram  10 mg Oral Daily   predniSONE  50 mg Oral TID   sodium chloride flush  3 mL Intravenous Q12H   traZODone  100 mg Oral QHS   Continuous Infusions:  sodium chloride       LOS: 0 days   08/17/21, MD [facility] Office  [phone] Pager - Text Page per [phone]  If 7PM-7AM, please contact night-coverage per Amion 08/16/2021, 8:53 AM

## 2021-08-16 NOTE — ED Notes (Signed)
Pillows provided to pt and visitor

## 2021-08-16 NOTE — Progress Notes (Signed)
ANTICOAGULATION CONSULT NOTE - Initial Consult  Pharmacy Consult for IV heparin Indication: VTE treatment  Allergies  Allergen Reactions   Ivp Dye [Iodinated Contrast Media]    Latex     Patient Measurements: Height: 5\' 3"  (160 cm) Weight: 54.4 kg (120 lb) IBW/kg (Calculated) : 52.4 Heparin Dosing Weight: 54.4 kg  Vital Signs: BP: 104/74 (02/15 1438) Pulse Rate: 58 (02/15 1438)  Labs: Recent Labs    08/15/21 1713 08/16/21 0645  HGB 10.9* 10.8*  HCT 34.7* 33.8*  PLT 192 172  CREATININE 0.89  --     Estimated Creatinine Clearance: 73 mL/min (by C-G formula based on SCr of 0.89 mg/dL).   Medical History: Past Medical History:  Diagnosis Date   Pulmonary embolism ([place]) 01/2009    Assessment: 36 YO female with a 4-day history of left lower extremity paresthesias when walking and some mild left leg swelling. Patient found to have clot in iliac stent. Scheduled for mechanical thrombectomy of stent and possible intervention on Friday. Patient was on Eliquis prior to admission and was continued while in patient. Last Eliquis dose 2/15 @ 0929.   Goal of Therapy:  Heparin level 0.3-0.7 units/ml aPTT 66-102 seconds Monitor platelets by anticoagulation protocol: Yes   Plan:  No heparin bolus as patient has received a dose of Eliquis today Start heparin gtt at 900 units/hr 6h confirmatory aPTT/HL Monitor CBC, aPTT/HL, and s/sx of bleeding daily  Joseph Art, Pharm.D. PGY-1 Pharmacy Resident [phone] 08/16/2021 4:55 PM

## 2021-08-17 ENCOUNTER — Encounter (HOSPITAL_COMMUNITY)
Admission: EM | Disposition: A | Discharge status: Home / Self Care | Payer: Self-pay | Source: Home / Self Care | Attending: Internal Medicine

## 2021-08-17 DIAGNOSIS — Z8774 Personal history of (corrected) congenital malformations of heart and circulatory system: Secondary | ICD-10-CM | POA: Diagnosis not present

## 2021-08-17 DIAGNOSIS — M79605 Pain in left leg: Secondary | ICD-10-CM | POA: Diagnosis not present

## 2021-08-17 DIAGNOSIS — Z79899 Other long term (current) drug therapy: Secondary | ICD-10-CM | POA: Diagnosis not present

## 2021-08-17 DIAGNOSIS — T82856A Stenosis of peripheral vascular stent, initial encounter: Secondary | ICD-10-CM | POA: Diagnosis not present

## 2021-08-17 DIAGNOSIS — Z9889 Other specified postprocedural states: Secondary | ICD-10-CM | POA: Diagnosis not present

## 2021-08-17 DIAGNOSIS — Z91040 Latex allergy status: Secondary | ICD-10-CM | POA: Diagnosis not present

## 2021-08-17 DIAGNOSIS — F17210 Nicotine dependence, cigarettes, uncomplicated: Secondary | ICD-10-CM | POA: Diagnosis present

## 2021-08-17 DIAGNOSIS — I82422 Acute embolism and thrombosis of left iliac vein: Secondary | ICD-10-CM | POA: Diagnosis not present

## 2021-08-17 DIAGNOSIS — I82402 Acute embolism and thrombosis of unspecified deep veins of left lower extremity: Secondary | ICD-10-CM | POA: Diagnosis not present

## 2021-08-17 DIAGNOSIS — Z86711 Personal history of pulmonary embolism: Secondary | ICD-10-CM | POA: Diagnosis not present

## 2021-08-17 DIAGNOSIS — D689 Coagulation defect, unspecified: Secondary | ICD-10-CM | POA: Diagnosis not present

## 2021-08-17 DIAGNOSIS — Z20822 Contact with and (suspected) exposure to covid-19: Secondary | ICD-10-CM | POA: Diagnosis not present

## 2021-08-17 DIAGNOSIS — D649 Anemia, unspecified: Secondary | ICD-10-CM | POA: Diagnosis not present

## 2021-08-17 DIAGNOSIS — I8290 Acute embolism and thrombosis of unspecified vein: Secondary | ICD-10-CM | POA: Diagnosis present

## 2021-08-17 DIAGNOSIS — Z91041 Radiographic dye allergy status: Secondary | ICD-10-CM | POA: Diagnosis not present

## 2021-08-17 DIAGNOSIS — I871 Compression of vein: Secondary | ICD-10-CM | POA: Diagnosis not present

## 2021-08-17 DIAGNOSIS — Z86718 Personal history of other venous thrombosis and embolism: Secondary | ICD-10-CM | POA: Diagnosis not present

## 2021-08-17 DIAGNOSIS — D6859 Other primary thrombophilia: Secondary | ICD-10-CM | POA: Diagnosis not present

## 2021-08-17 DIAGNOSIS — R69 Illness, unspecified: Secondary | ICD-10-CM | POA: Diagnosis not present

## 2021-08-17 DIAGNOSIS — Z7901 Long term (current) use of anticoagulants: Secondary | ICD-10-CM | POA: Diagnosis not present

## 2021-08-17 DIAGNOSIS — T82868A Thrombosis of vascular prosthetic devices, implants and grafts, initial encounter: Secondary | ICD-10-CM | POA: Diagnosis not present

## 2021-08-17 DIAGNOSIS — Y831 Surgical operation with implant of artificial internal device as the cause of abnormal reaction of the patient, or of later complication, without mention of misadventure at the time of the procedure: Secondary | ICD-10-CM | POA: Diagnosis not present

## 2021-08-17 HISTORY — PX: PERIPHERAL VASCULAR THROMBECTOMY: CATH118306

## 2021-08-17 HISTORY — PX: INTRAVASCULAR ULTRASOUND/IVUS: CATH118244

## 2021-08-17 LAB — CBC
HCT: 32.3 % — ABNORMAL LOW (ref 36.0–46.0)
HCT: 33.2 % — ABNORMAL LOW (ref 36.0–46.0)
Hemoglobin: 10.7 g/dL — ABNORMAL LOW (ref 12.0–15.0)
Hemoglobin: 10.4 g/dL — ABNORMAL LOW (ref 12.0–15.0)
MCH: 30.1 pg (ref 26.0–34.0)
MCH: 29.1 pg (ref 26.0–34.0)
MCHC: 33.1 g/dL (ref 30.0–36.0)
MCHC: 31.3 g/dL (ref 30.0–36.0)
MCV: 91 fL (ref 80.0–100.0)
MCV: 93 fL (ref 80.0–100.0)
Platelets: 169 10*3/uL (ref 150–400)
Platelets: 168 10*3/uL (ref 150–400)
RBC: 3.55 MIL/uL — ABNORMAL LOW (ref 3.87–5.11)
RBC: 3.57 MIL/uL — ABNORMAL LOW (ref 3.87–5.11)
RDW: 15.1 % (ref 11.5–15.5)
RDW: 15.2 % (ref 11.5–15.5)
WBC: 12.1 10*3/uL — ABNORMAL HIGH (ref 4.0–10.5)
WBC: 12 10*3/uL — ABNORMAL HIGH (ref 4.0–10.5)
nRBC: 0 % (ref 0.0–0.2)
nRBC: 0 % (ref 0.0–0.2)

## 2021-08-17 LAB — FIBRINOGEN: Fibrinogen: 356 mg/dL (ref 210–475)

## 2021-08-17 LAB — HEPARIN LEVEL (UNFRACTIONATED)
Heparin Unfractionated: >1.1 IU/mL — ABNORMAL HIGH (ref 0.30–0.70)
Heparin Unfractionated: 0.94 IU/mL — ABNORMAL HIGH (ref 0.30–0.70)

## 2021-08-17 LAB — APTT
aPTT: 51 seconds — ABNORMAL HIGH (ref 24–36)
aPTT: 74 seconds — ABNORMAL HIGH (ref 24–36)

## 2021-08-17 SURGERY — PERIPHERAL VASCULAR THROMBECTOMY
Anesthesia: LOCAL

## 2021-08-17 MED ORDER — HEPARIN (PORCINE) IN NACL 1000-0.9 UT/500ML-% IV SOLN
INTRAVENOUS | Status: DC | PRN
  Administered 2021-08-17: 500 mL

## 2021-08-17 MED ORDER — MIDAZOLAM HCL 2 MG/2ML IJ SOLN: 1.0000 mg | INTRAMUSCULAR | Status: DC | PRN

## 2021-08-17 MED ORDER — MIDAZOLAM HCL 2 MG/2ML IJ SOLN
INTRAMUSCULAR | Status: DC | PRN
  Administered 2021-08-17 (×2): 1 mg via INTRAVENOUS

## 2021-08-17 MED ORDER — FENTANYL CITRATE (PF) 100 MCG/2ML IJ SOLN
INTRAMUSCULAR | Status: AC
  Filled 2021-08-17: qty 2

## 2021-08-17 MED ORDER — LIDOCAINE HCL (PF) 1 % IJ SOLN
INTRAMUSCULAR | Status: AC
  Filled 2021-08-17: qty 30

## 2021-08-17 MED ORDER — ONDANSETRON HCL 4 MG/2ML IJ SOLN: 4.0000 mg | Freq: Four times a day (QID) | INTRAMUSCULAR | Status: DC | PRN

## 2021-08-17 MED ORDER — SODIUM CHLORIDE 0.9 % IV SOLN
1.0000 mg/h | INTRAVENOUS | Status: DC
  Administered 2021-08-17 – 2021-08-18 (×2): 1 mg/h
  Filled 2021-08-17 (×7): qty 10

## 2021-08-17 MED ORDER — HEPARIN (PORCINE) 25000 UT/250ML-% IV SOLN
1000.0000 [IU]/h | INTRAVENOUS | Status: AC
  Administered 2021-08-17: 16:00:00 800 [IU]/h via INTRAVENOUS
  Administered 2021-08-18: 17:00:00 1000 [IU]/h via INTRAVENOUS
  Filled 2021-08-17 (×2): qty 250

## 2021-08-17 MED ORDER — FENTANYL CITRATE (PF) 100 MCG/2ML IJ SOLN
INTRAMUSCULAR | Status: DC | PRN
Start: 2021-08-17 — End: 2021-08-17
  Administered 2021-08-17 (×2): 25 ug via INTRAVENOUS

## 2021-08-17 MED ORDER — LIDOCAINE HCL (PF) 1 % IJ SOLN
INTRAMUSCULAR | Status: DC | PRN
Start: 2021-08-17 — End: 2021-08-17
  Administered 2021-08-17: 5 mL

## 2021-08-17 MED ORDER — SODIUM CHLORIDE 0.9% FLUSH
3.0000 mL | Freq: Two times a day (BID) | INTRAVENOUS | Status: DC
  Administered 2021-08-17: 3 mL via INTRAVENOUS

## 2021-08-17 MED ORDER — SODIUM CHLORIDE 0.9% FLUSH: 3.0000 mL | INTRAVENOUS | Status: DC | PRN

## 2021-08-17 MED ORDER — DIPHENHYDRAMINE HCL 50 MG/ML IJ SOLN
50.0000 mg | Freq: Once | INTRAMUSCULAR | Status: AC
  Administered 2021-08-18: 50 mg via INTRAVENOUS
  Filled 2021-08-17: qty 1

## 2021-08-17 MED ORDER — MIDAZOLAM HCL 2 MG/2ML IJ SOLN
INTRAMUSCULAR | Status: AC
  Filled 2021-08-17: qty 2

## 2021-08-17 MED ORDER — HEPARIN (PORCINE) IN NACL 1000-0.9 UT/500ML-% IV SOLN
INTRAVENOUS | Status: AC
  Filled 2021-08-17: qty 500

## 2021-08-17 MED ORDER — MORPHINE SULFATE (PF) 4 MG/ML IV SOLN
5.0000 mg | INTRAVENOUS | Status: DC | PRN
  Administered 2021-08-17 – 2021-08-18 (×3): 5 mg via INTRAVENOUS
  Filled 2021-08-17 (×3): qty 2

## 2021-08-17 MED ORDER — SODIUM CHLORIDE 0.9 % IV SOLN: 250.0000 mL | INTRAVENOUS | Status: DC | PRN

## 2021-08-17 SURGICAL SUPPLY — 9 items
CATH INFUS 135CMX30CM (CATHETERS) ×1 IMPLANT
CATH VISIONS PV .035 IVUS (CATHETERS) ×1 IMPLANT
GLIDEWIRE ADV .035X260CM (WIRE) ×1 IMPLANT
KIT MICROPUNCTURE NIT STIFF (SHEATH) ×1 IMPLANT
PROTECTION STATION PRESSURIZED (MISCELLANEOUS) ×3
SHEATH PINNACLE 8F 10CM (SHEATH) ×1 IMPLANT
STATION PROTECTION PRESSURIZED (MISCELLANEOUS) IMPLANT
TRAY PV CATH (CUSTOM PROCEDURE TRAY) ×1 IMPLANT
WIRE TORQFLEX AUST .018X40CM (WIRE) ×2 IMPLANT

## 2021-08-17 NOTE — Progress Notes (Signed)
° °  08/17/21  To Whom it may concern,  Amber Carroll was hospitalized at [facility] on 08/15/2021, and remains hospitalized as of today. She has suffered a true medical emergency and will require urgent surgical interventions which can not be delayed.   Presently it is not clear how long she will need to remain in the hospital, but she is not expected to be medically cleared for travel of any kind until 08/28/2021 at the earliest.    Should you have further questions please feel free to contact our office.  Be advised that no medical information will be divulged without a signed HIPA release from the patient.    Sincerely,  Lonia Blood, MD [facility] Office  [phone]

## 2021-08-17 NOTE — Progress Notes (Signed)
ANTICOAGULATION CONSULT NOTE - Follow Up Consult  Pharmacy Consult for heparin Indication: DVT  Labs: Recent Labs    08/15/21 1713 08/16/21 0645 08/17/21 0207  HGB 10.9* 10.8* 10.7*  HCT 34.7* 33.8* 32.3*  PLT 192 172 169  APTT  --   --  51*  HEPARINUNFRC  --   --  >1.10*  CREATININE 0.89  --   --     Assessment: 35yo female subtherapeutic on heparin with initial dosing while awaiting vascular surgery; no infusion issues or signs of bleeding per RN.  Goal of Therapy:  aPTT 66-102 seconds   Plan:  Will increase heparin infusion by 2 units/kg/hr to 1000 units/hr and check PTT in 6 hours.    Vernard Gambles, PharmD, BCPS  08/17/2021,3:40 AM

## 2021-08-17 NOTE — Op Note (Signed)
° ° °  Patient name: Amber Carroll MRN: [id number] DOB: [birth year] Sex: female  08/15/2021 - 08/17/2021 Pre-operative Diagnosis: Thrombosed left iliocaval venous stents Post-operative diagnosis:  Same Surgeon:  Cephus Shelling, MD Procedure Performed: 1.  Ultrasound-guided access left small saphenous vein 2.  Intravascular ultrasound (IVUS) of left superficial femoral vein, common femoral vein, profunda vein, left iliac vein and infra-renal vena cava 3.  Placement of thrombolytics catheter in occluded left iliocaval venous stents with initiation of chemical thrombolysis with tPA 4.  46 minutes of monitored moderate conscious sedation time  Indications: Patient is a 36 year old female who has a history of hypercoagulable disorder that previously underwent left iliac vein mechanical thrombectomy with stenting in [facility] by interventional radiology in February 2022.  She presented to the ED yesterday and was seen for leg swelling and discomfort.  She was found to have thrombosis of her left iliac vein stents.  She presents today for thrombolysis after risk benefits discussed.  Findings: Ultrasound-guided access of left small saphenous vein and ultimately was able to get a wire into the popliteal vein and then up across the occluded left iliac vein stents.  IVUS did not show any evidence of acute thrombus in the superficial femoral vein, common femoral vein or profunda vein distal to the stents.  There was acute to subacute appearing thrombus throughout the stented segment of common femoral vein, left external iliac vein, and left common iliac vein.  No thrombus seen proximally in IVC above the stents.    UniFuse catheter was placed in the occluded stents and will run tPA at 1 mg/ hour and heparin 800 units/hour through the sheath.   Procedure:  The patient was identified in the holding area and taken to room 8.  The patient was placed prone.  The left popliteal fossa was then prepped and draped in  standard sterile fashion.  Timeout was performed to identify patient, procedure and site.  Ultimately evaluated the left popliteal fossa and after initially evaluating the popliteal artery the adjacent veins did not appear distended as you would normally expect with a significant left leg DVT.  I was able to find a small saphenous vein in the calf muscle that looked amendable for access.  This was compressible after evaluation with ultrasound.  I accessed it several times with microaccess needle under US guidance and finally was able to get a microwire to thread and then placed a micro sheath.  I confirmed under fluoroscopy that I was in the popliteal vein/superficial femoral vein was able to cross the common femoral vein into the occluded left iliac vein stents.  I exchanged for a 8 French sheath.  I then used a Glidewire advantage to cross all the occluded stents.  We then performed intravascular ultrasound (IVUS) of the left superficial femoral vein, common femoral vein, profunda vein, left iliac veins, and the infrarenal vena cava.  I then placed a 30 cm length infusion catheter called UniFuse catheter throughout the segment of occluded iliac vein stents.  Satisfied with the results patient will be taken to the ICU for thrombolysis overnight.  The catheter was secured.  She remained stable.  Plan: Patient will return tomorrow to the Cath Lab for thrombolysis catheter check with Dr. Irine Seal, MD [facility]: [phone]

## 2021-08-17 NOTE — Progress Notes (Signed)
ANTICOAGULATION CONSULT NOTE - follow-up  Pharmacy Consult for IV heparin Indication: VTE treatment  Allergies  Allergen Reactions   Ivp Dye [Iodinated Contrast Media]    Latex     Patient Measurements: Height: 5\' 3"  (160 cm) Weight: 54.4 kg (120 lb) IBW/kg (Calculated) : 52.4 Heparin Dosing Weight: 54.4 kg  Vital Signs: Temp: 98.6 F (37 C) (02/16 1151) Temp Source: Oral (02/16 1151) BP: 132/77 (02/16 1635) Pulse Rate: 64 (02/16 1635)  Labs: Recent Labs    08/15/21 1713 08/16/21 0645 08/17/21 0207 08/17/21 0944  HGB 10.9* 10.8* 10.7*  --   HCT 34.7* 33.8* 32.3*  --   PLT 192 172 169  --   APTT  --   --  51* 74*  HEPARINUNFRC  --   --  >1.10*  --   CREATININE 0.89  --   --   --      Estimated Creatinine Clearance: 73 mL/min (by C-G formula based on SCr of 0.89 mg/dL).   Medical History: Past Medical History:  Diagnosis Date   Pulmonary embolism ([place]) 01/2009    Assessment: 36 YO female with a 4-day history of left lower extremity paresthesias when walking and some mild left leg swelling. Patient found to have clot in iliac stent. Scheduled for mechanical thrombectomy of stent and possible intervention  today 2/16. Patient was on Eliquis prior to admission and was continued while in patient. Last Eliquis dose 2/15 @ 0929.    2/16- patient is now status post-op placement of thrombolytics catheter in occluded left iliocaval venous stent with initiation of chemical thrombolysis with tPA.  aPTT was 74 seconds at 09:44  Goal of Therapy: (reduced goal ordered post op) aPTT 56-85 seconds Heparin level 0.2 to 0.5 Monitor platelets by anticoagulation protocol: Yes   Plan:  Reduce IV  heparin gtt at 800 units/hr aPTT / HL 2/17 at 00:00 Monitor CBC, aPTT/HL, and s/sx of bleeding daily Follow up for transtion back to Eliquis when able.  Thank you for allowing pharmacy to be part of this patients care team.  Vaughan Basta BS, PharmD, BCPS Clinical  Pharmacist 08/17/2021 5:03 PM  Please check AMION for all [place] phone numbers After 10:00 PM, call [place] [phone]

## 2021-08-17 NOTE — Progress Notes (Signed)
ANTICOAGULATION CONSULT NOTE - Initial Consult  Pharmacy Consult for IV heparin Indication: VTE treatment  Allergies  Allergen Reactions   Ivp Dye [Iodinated Contrast Media]    Latex     Patient Measurements: Height: 5\' 3"  (160 cm) Weight: 54.4 kg (120 lb) IBW/kg (Calculated) : 52.4 Heparin Dosing Weight: 54.4 kg  Vital Signs: Temp: 98.6 F (37 C) (02/16 1151) Temp Source: Oral (02/16 1151) BP: 101/70 (02/16 1151) Pulse Rate: 61 (02/16 1151)  Labs: Recent Labs    08/15/21 1713 08/16/21 0645 08/17/21 0207 08/17/21 0944  HGB 10.9* 10.8* 10.7*  --   HCT 34.7* 33.8* 32.3*  --   PLT 192 172 169  --   APTT  --   --  51* 74*  HEPARINUNFRC  --   --  >1.10*  --   CREATININE 0.89  --   --   --      Estimated Creatinine Clearance: 73 mL/min (by C-G formula based on SCr of 0.89 mg/dL).   Medical History: Past Medical History:  Diagnosis Date   Pulmonary embolism (HCC) 01/2009    Assessment: 36 YO female with a 4-day history of left lower extremity paresthesias when walking and some mild left leg swelling. Patient found to have clot in iliac stent. Scheduled for mechanical thrombectomy of stent and possible intervention  today 2/16. Patient was on Eliquis prior to admission and was continued while in patient. Last Eliquis dose 2/15 @ 0929.     PTT 74, therapeutic PTT on IV heparin drip  1000 units/hr.  CBC stable with hgb reamins in 10s range. PLTC wnl.  No bleeding noted.   Goal of Therapy:  Heparin level 0.3-0.7 units/ml aPTT 66-102 seconds Monitor platelets by anticoagulation protocol: Yes   Plan:  Continue IV  heparin gtt at 1000 units/hr Follow up after thrombectomy today. Monitor CBC, aPTT/HL, and s/sx of bleeding daily Follow up for transtion back to Eliquis when able.  Thank you for allowing pharmacy to be part of this patients care team.  3/15, RPh Clinical Pharmacist [phone] 08/17/2021 11:55 AM  Please check AMION for all [facility] Pharmacy phone  numbers After 10:00 PM, call Main Pharmacy [phone]

## 2021-08-17 NOTE — Progress Notes (Addendum)
° ° °  Subjective  -   No significant changes overnight. On heparin drip   Physical Exam:  Mild left leg edema Abdomen soft Nonlabored breathing   Assessment/Plan:    Recurrent left leg DVT: The plan is for venogram today via a left popliteal approach and placement of a lysis catheter for overnight tPA.  She will then return tomorrow for a follow-up study.  We will evaluate her stents once they have been opened for any potential etiology for her recurrent thrombosis.  She will also need hematology assistance in managing her regimen of anticoagulation when she is discharged.  The patient needs a note for a custody hearing.  She is going for urgent procedure today, which cannot be delayed.  Hopefully this progress note can serve as her medical note  Amber Carroll 08/17/2021 8:40 AM --  Vitals:   08/17/21 0416 08/17/21 0732  BP: 105/71 96/65  Pulse: 76 69  Resp: 20 14  Temp: 98.6 F (37 C) 98.5 F (36.9 C)  SpO2: 96% 97%    Intake/Output Summary (Last 24 hours) at 08/17/2021 0840 Last data filed at 08/17/2021 0800 Gross per 24 hour  Intake 483.71 ml  Output 3 ml  Net 480.71 ml     Laboratory CBC    Component Value Date/Time   WBC 12.1 (H) 08/17/2021 0207   HGB 10.7 (L) 08/17/2021 0207   HGB 12.5 08/26/2009 1213   HCT 32.3 (L) 08/17/2021 0207   HCT 38.7 08/26/2009 1213   PLT 169 08/17/2021 0207   PLT 184 08/26/2009 1213    BMET    Component Value Date/Time   NA 137 08/15/2021 1713   K 3.8 08/15/2021 1713   CL 103 08/15/2021 1713   CO2 24 08/15/2021 1713   GLUCOSE 87 08/15/2021 1713   BUN 6 08/15/2021 1713   CREATININE 0.89 08/15/2021 1713   CALCIUM 9.4 08/15/2021 1713   GFRNONAA >60 08/15/2021 1713   GFRAA  02/07/2009 2314    >60        The eGFR has been calculated using the MDRD equation. This calculation has not been validated in all clinical situations. eGFR's persistently <60 mL/min signify possible Chronic Kidney Disease.    COAG Lab  Results  Component Value Date   INR 0.9 06/29/2021   INR 1.00 (L) 08/26/2009   INR 0.99 05/31/2009   PROTIME 12.0 08/26/2009   PROTIME 19.2 (H) 05/17/2009   PROTIME 18.0 (H) 02/23/2009   No results found for: PTT  Antibiotics Anti-infectives (From admission, onward)    None        V. Leia Alf, M.D., [facility] [place] Office: [phone] Pager:  [phone]

## 2021-08-17 NOTE — Progress Notes (Addendum)
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  She denied chest pain or shortness of breath.  She has a history of allergic reaction to IV contrast dye described as swelling and itching of the throat.  She admits to smoking 5-6 cigarettes a day.  Consultants:  Vascular Surgery  Code Status: FULL CODE  Antimicrobials:  None  DVT prophylaxis: Eliquis  Interim Hx: Venogram 2/15 noted occlusion of the patient's iliocaval venous stent.  The patient is resting comfortably in bed.  She denies chest pain shortness of breath fevers or chills.  She states her leg feels "about the same".  No new complaints today.  Assessment & Plan:  Left leg DVT - clotting disorder No evidence of left leg DVT on venous duplex 2/14 -venogram has confirmed thrombosis of the patient's ilio caval venous stent -ongoing care per vascular surgery with plan for catheter placement today to allow for catheter directed thrombolysis  Prior hx of DVT tx as follows: -08/2020: mechanical thrombectomy -11/2020: angioplasty/stenting from the left common iliac vein thru left common femoral vein -office note 10/10/2020 suggests prior PE and L LE DVT induced by OCPs  Protein S deficiency   Clotting disorder Patient cannot recall if she has protein S or protein C deficiency -she has been on lifelong Eliquis and attest to religious compliance -this clotting episode must therefore be considered a failure of Eliquis -the tentative plan at this time is to transition to subcu Lovenox at discharge -this will be discussed with hematology and outpatient  hematology follow-up will be arranged prior to discharge  Tobacco abuse Counseled on absolute need to discontinue tobacco abuse and direct connection to clotting disorders  Normocytic anemia No evidence of gross blood loss -hemoglobin stable   Family Communication: Spoke with mother at bedside Disposition: From home -anticipate discharge home after correction of iliac thrombosis  Objective: Blood pressure 96/65, pulse 69, temperature 98.5 F (36.9 C), temperature source Oral, resp. rate 14, height 5\' 3"  (1.6 m), weight 54.4 kg, last menstrual period 08/14/2021, SpO2 97 %.  Intake/Output Summary (Last 24 hours) at 08/17/2021 1010 Last data filed at 08/17/2021 0800 Gross per 24 hour  Intake 483.71 ml  Output 3 ml  Net 480.71 ml   Filed Weights   08/15/21 1658  Weight: 54.4 kg    Examination: General: No acute respiratory distress Lungs: Clear to auscultation bilaterally without wheezes or crackles Cardiovascular: RRR without murmur or rub Abdomen: NT/ND, soft, BS positive, no rebound Extremities: No significant cyanosis, clubbing, or edema bilateral lower extremities  CBC: Recent Labs  Lab 08/15/21 1713 08/16/21 0645 08/17/21 0207  WBC 8.6 6.9 12.1*  NEUTROABS 5.7  --   --   HGB 10.9* 10.8* 10.7*  HCT 34.7* 33.8* 32.3*  MCV 92.8 91.8 91.0  PLT 192 172 [id number]    Basic Metabolic Panel: Recent Labs  Lab 08/15/21 1713  NA 137  K 3.8  CL 103  CO2 24  GLUCOSE 87  BUN 6  CREATININE 0.89  CALCIUM 9.4    GFR: Estimated Creatinine Clearance: 73 mL/min (by C-G formula based on SCr of 0.89  mg/dL).   Scheduled Meds:  atomoxetine  40 mg Oral q morning   busPIRone  15 mg Oral Daily   escitalopram  10 mg Oral Daily   predniSONE  50 mg Oral TID   traZODone  100 mg Oral QHS   Continuous Infusions:  sodium chloride 10 mL/hr at 08/17/21 0800   heparin 1,000 Units/hr (08/17/21 0800)     LOS: 0 days   Cherene Altes, MD [facility] Office   [phone] Pager - Text Page per Shea Evans  If 7PM-7AM, please contact night-coverage per Amion 08/17/2021, 10:10 AM

## 2021-08-18 ENCOUNTER — Encounter (HOSPITAL_COMMUNITY): Payer: Self-pay | Admitting: Vascular Surgery

## 2021-08-18 ENCOUNTER — Encounter (HOSPITAL_COMMUNITY)
Admission: EM | Disposition: A | Discharge status: Home / Self Care | Payer: Self-pay | Source: Home / Self Care | Attending: Internal Medicine

## 2021-08-18 DIAGNOSIS — T82868A Thrombosis of vascular prosthetic devices, implants and grafts, initial encounter: Secondary | ICD-10-CM

## 2021-08-18 DIAGNOSIS — T82856A Stenosis of peripheral vascular stent, initial encounter: Secondary | ICD-10-CM

## 2021-08-18 HISTORY — PX: PERIPHERAL VASCULAR THROMBECTOMY: CATH118306

## 2021-08-18 LAB — CBC
HCT: 30.7 % — ABNORMAL LOW (ref 36.0–46.0)
HCT: 32.2 % — ABNORMAL LOW (ref 36.0–46.0)
HCT: 36 % (ref 36.0–46.0)
Hemoglobin: 10.1 g/dL — ABNORMAL LOW (ref 12.0–15.0)
Hemoglobin: 10.3 g/dL — ABNORMAL LOW (ref 12.0–15.0)
Hemoglobin: 11.4 g/dL — ABNORMAL LOW (ref 12.0–15.0)
MCH: 30.2 pg (ref 26.0–34.0)
MCH: 29.8 pg (ref 26.0–34.0)
MCH: 30 pg (ref 26.0–34.0)
MCHC: 32.9 g/dL (ref 30.0–36.0)
MCHC: 32 g/dL (ref 30.0–36.0)
MCHC: 31.7 g/dL (ref 30.0–36.0)
MCV: 91.9 fL (ref 80.0–100.0)
MCV: 93.1 fL (ref 80.0–100.0)
MCV: 94.7 fL (ref 80.0–100.0)
Platelets: 165 10*3/uL (ref 150–400)
Platelets: 154 10*3/uL (ref 150–400)
Platelets: 159 10*3/uL (ref 150–400)
RBC: 3.34 MIL/uL — ABNORMAL LOW (ref 3.87–5.11)
RBC: 3.46 MIL/uL — ABNORMAL LOW (ref 3.87–5.11)
RBC: 3.8 MIL/uL — ABNORMAL LOW (ref 3.87–5.11)
RDW: 15.2 % (ref 11.5–15.5)
RDW: 15.2 % (ref 11.5–15.5)
RDW: 15.3 % (ref 11.5–15.5)
WBC: 10.9 10*3/uL — ABNORMAL HIGH (ref 4.0–10.5)
WBC: 10.4 10*3/uL (ref 4.0–10.5)
WBC: 11.2 10*3/uL — ABNORMAL HIGH (ref 4.0–10.5)
nRBC: 0 % (ref 0.0–0.2)
nRBC: 0 % (ref 0.0–0.2)
nRBC: 0 % (ref 0.0–0.2)

## 2021-08-18 LAB — FIBRINOGEN
Fibrinogen: 303 mg/dL (ref 210–475)
Fibrinogen: 289 mg/dL (ref 210–475)
Fibrinogen: 291 mg/dL (ref 210–475)

## 2021-08-18 LAB — APTT
aPTT: 58 seconds — ABNORMAL HIGH (ref 24–36)
aPTT: 63 seconds — ABNORMAL HIGH (ref 24–36)
aPTT: 80 seconds — ABNORMAL HIGH (ref 24–36)

## 2021-08-18 LAB — HEPARIN LEVEL (UNFRACTIONATED)
Heparin Unfractionated: 0.77 IU/mL — ABNORMAL HIGH (ref 0.30–0.70)
Heparin Unfractionated: 0.85 IU/mL — ABNORMAL HIGH (ref 0.30–0.70)
Heparin Unfractionated: >1.1 IU/mL — ABNORMAL HIGH (ref 0.30–0.70)

## 2021-08-18 SURGERY — PERIPHERAL VASCULAR THROMBECTOMY
Anesthesia: LOCAL

## 2021-08-18 MED ORDER — SODIUM CHLORIDE 0.9% FLUSH: 3.0000 mL | INTRAVENOUS | Status: DC | PRN

## 2021-08-18 MED ORDER — HYDRALAZINE HCL 20 MG/ML IJ SOLN: 5.0000 mg | INTRAMUSCULAR | Status: DC | PRN

## 2021-08-18 MED ORDER — HEPARIN (PORCINE) IN NACL 1000-0.9 UT/500ML-% IV SOLN
INTRAVENOUS | Status: DC | PRN
  Administered 2021-08-18: 500 mL

## 2021-08-18 MED ORDER — LIDOCAINE HCL (PF) 1 % IJ SOLN
INTRAMUSCULAR | Status: DC | PRN
Start: 2021-08-18 — End: 2021-08-18
  Administered 2021-08-18: 15 mL

## 2021-08-18 MED ORDER — LIDOCAINE HCL (PF) 1 % IJ SOLN
INTRAMUSCULAR | Status: AC
Start: 2021-08-18 — End: ?
  Filled 2021-08-18: qty 30

## 2021-08-18 MED ORDER — CHLORHEXIDINE GLUCONATE CLOTH 2 % EX PADS
6.0000 | MEDICATED_PAD | Freq: Every day | CUTANEOUS | Status: DC
  Administered 2021-08-19: 6 via TOPICAL

## 2021-08-18 MED ORDER — ONDANSETRON HCL 4 MG/2ML IJ SOLN
4.0000 mg | Freq: Four times a day (QID) | INTRAMUSCULAR | Status: DC | PRN
Start: 2021-08-18 — End: 2021-08-19

## 2021-08-18 MED ORDER — METHYLPREDNISOLONE SODIUM SUCC 125 MG IJ SOLR
INTRAMUSCULAR | Status: DC | PRN
  Administered 2021-08-18: 125 mg via INTRAVENOUS

## 2021-08-18 MED ORDER — LABETALOL HCL 5 MG/ML IV SOLN: 10.0000 mg | INTRAVENOUS | Status: DC | PRN

## 2021-08-18 MED ORDER — SODIUM CHLORIDE 0.9 % IV SOLN: 250.0000 mL | INTRAVENOUS | Status: DC | PRN

## 2021-08-18 MED ORDER — ACETAMINOPHEN 325 MG PO TABS
650.0000 mg | ORAL_TABLET | ORAL | Status: DC | PRN
  Administered 2021-08-18: 650 mg via ORAL
  Filled 2021-08-18: qty 2

## 2021-08-18 MED ORDER — DIPHENHYDRAMINE HCL 50 MG/ML IJ SOLN
INTRAMUSCULAR | Status: AC
  Filled 2021-08-18: qty 1

## 2021-08-18 MED ORDER — FENTANYL CITRATE (PF) 100 MCG/2ML IJ SOLN
INTRAMUSCULAR | Status: DC | PRN
  Administered 2021-08-18 (×2): 50 ug via INTRAVENOUS

## 2021-08-18 MED ORDER — SODIUM CHLORIDE 0.9 % WEIGHT BASED INFUSION
1.0000 mL/kg/h | INTRAVENOUS | Status: AC
  Administered 2021-08-18: 1 mL/kg/h via INTRAVENOUS

## 2021-08-18 MED ORDER — METHYLPREDNISOLONE SODIUM SUCC 125 MG IJ SOLR
INTRAMUSCULAR | Status: AC
  Filled 2021-08-18: qty 2

## 2021-08-18 MED ORDER — SODIUM CHLORIDE 0.9% FLUSH: 3.0000 mL | Freq: Two times a day (BID) | INTRAVENOUS | Status: DC

## 2021-08-18 MED ORDER — FENTANYL CITRATE (PF) 100 MCG/2ML IJ SOLN
INTRAMUSCULAR | Status: AC
  Filled 2021-08-18: qty 2

## 2021-08-18 MED ORDER — MIDAZOLAM HCL 2 MG/2ML IJ SOLN
INTRAMUSCULAR | Status: DC | PRN
  Administered 2021-08-18: 1 mg via INTRAVENOUS

## 2021-08-18 MED ORDER — DIPHENHYDRAMINE HCL 50 MG/ML IJ SOLN
INTRAMUSCULAR | Status: DC | PRN
Start: 2021-08-18 — End: 2021-08-18
  Administered 2021-08-18: 25 mg via INTRAVENOUS

## 2021-08-18 MED ORDER — MIDAZOLAM HCL 2 MG/2ML IJ SOLN
INTRAMUSCULAR | Status: AC
  Filled 2021-08-18: qty 2

## 2021-08-18 SURGICAL SUPPLY — 10 items
BALLN MUSTANG 10X80X135 (BALLOONS) ×2
BALLN MUSTANG 12X80X75 (BALLOONS) ×2
BALLOON MUSTANG 10X80X135 (BALLOONS) IMPLANT
BALLOON MUSTANG 12X80X75 (BALLOONS) IMPLANT
CATH VISIONS PV .035 IVUS (CATHETERS) ×1 IMPLANT
GLIDEWIRE ADV .035X260CM (WIRE) ×1 IMPLANT
KIT ENCORE 26 ADVANTAGE (KITS) ×1 IMPLANT
SHEATH PINNACLE VASC 9FR (SHEATH) ×1 IMPLANT
STENT VENOUS ABRE 14X60 (Permanent Stent) ×1 IMPLANT
TRAY PV CATH (CUSTOM PROCEDURE TRAY) ×1 IMPLANT

## 2021-08-18 NOTE — Progress Notes (Signed)
[facility] [place]  Subjective  -thinks her left leg feels a little bit better.   Objective 135/86 68 98.2 F (36.8 C) (Oral) 20 96%  Intake/Output Summary (Last 24 hours) at 08/18/2021 1116 Last data filed at 08/18/2021 1100 Gross per 24 hour  Intake 1402.97 ml  Output --  Net 1402.97 ml    Left popliteal venous sheath with no hematoma Left DP palpable Left foot motor and sensory intact  Laboratory Lab Results: Recent Labs    08/18/21 0102 08/18/21 0618  WBC 10.9* 10.4  HGB 10.1* 10.3*  HCT 30.7* 32.2*  PLT 165 154   BMET Recent Labs    08/15/21 1713  NA 137  K 3.8  CL 103  CO2 24  GLUCOSE 87  BUN 6  CREATININE 0.89  CALCIUM 9.4    COAG Lab Results  Component Value Date   INR 0.9 06/29/2021   INR 1.00 (L) 08/26/2009   INR 0.99 05/31/2009   PROTIME 12.0 08/26/2009   PROTIME 19.2 (H) 05/17/2009   PROTIME 18.0 (H) 02/23/2009   No results found for: PTT  Assessment/Planning:  Postop day 1 status post initiation of thrombolysis of occluded left iliac venous stent.  No acute events overnight in the ICU.  Continue heparin through the sheath titrated by pharmacy with tPA at 1 mg an hour.  Thrombolytics catheter check today with Dr. Stanford Breed in the Cath Lab.  Last hemoglobin 10.3.  Last fibrinogen 289.  Marty Heck 08/18/2021 11:16 AM --

## 2021-08-18 NOTE — Progress Notes (Signed)
ANTICOAGULATION CONSULT NOTE - follow-up  Pharmacy Consult for IV heparin Indication: VTE treatment  Allergies  Allergen Reactions   Ivp Dye [Iodinated Contrast Media]    Latex     Patient Measurements: Height: 5\' 3"  (160 cm) Weight: 54.2 kg (119 lb 7.8 oz) IBW/kg (Calculated) : 52.4 Heparin Dosing Weight: 54.4 kg  Vital Signs: Temp: 98.2 F (36.8 C) (02/17 0731) Temp Source: Oral (02/17 0731) BP: 129/87 (02/17 0600) Pulse Rate: 60 (02/17 0600)  Labs: Recent Labs    08/15/21 1713 08/16/21 0645 08/17/21 0944 08/17/21 1812 08/17/21 1813 08/18/21 0102 08/18/21 0618  HGB 10.9*   < >  --   --  10.4* 10.1* 10.3*  HCT 34.7*   < >  --   --  33.2* 30.7* 32.2*  PLT 192   < >  --   --  168 165 154  APTT  --    < > 74*  --   --  58* 63*  HEPARINUNFRC  --    < >  --  0.94*  --  0.77* 0.85*  CREATININE 0.89  --   --   --   --   --   --    < > = values in this interval not displayed.     Estimated Creatinine Clearance: 73 mL/min (by C-G formula based on SCr of 0.89 mg/dL).   Medical History: Past Medical History:  Diagnosis Date   Pulmonary embolism (HCC) 01/2009    Assessment: 36 YO female with a 4-day history of left lower extremity paresthesias when walking and some mild left leg swelling. Patient found to have clot in iliac stent. Scheduled for mechanical thrombectomy of stent and possible intervention  today 2/16. Patient was on Eliquis prior to admission and was continued while in patient. Last Eliquis dose 2/15 @ 0929.    2/16- patient is now status post-op placement of thrombolytics catheter in occluded left iliocaval venous stent with initiation of chemical thrombolysis with tPA.  aPTT 63 seconds this morning, within goal range.  Heparin level still falsely elevated from recent Eliquis.  No bleeding or complications noted.  Goal of Therapy: (reduced goal ordered post op) aPTT 56-85 seconds Heparin level 0.2 to 0.5 Monitor platelets by anticoagulation protocol:  Yes   Plan:  Continue IV heparin at 1000 units/hr. Monitor CBC, aPTT/HL, and s/sx of bleeding daily Follow up for transtion back to Eliquis when able.  Thank you for allowing pharmacy to be part of this patients care team.  3/16, Reece Leader, [facility] Clinical Pharmacist  08/18/2021 11:12 AM   [facility] pharmacy phone numbers are listed on [email or web address]

## 2021-08-18 NOTE — Progress Notes (Addendum)
ANTICOAGULATION CONSULT NOTE - follow-up  Pharmacy Consult for IV heparin Indication: VTE treatment  Allergies  Allergen Reactions   Ivp Dye [Iodinated Contrast Media]    Latex     Patient Measurements: Height: 5\' 3"  (160 cm) Weight: 54.2 kg (119 lb 7.8 oz) IBW/kg (Calculated) : 52.4 Heparin Dosing Weight: 54.4 kg  Vital Signs: Temp: 98.4 F (36.9 C) (02/17 1551) Temp Source: Oral (02/17 1551) BP: 106/72 (02/17 1700) Pulse Rate: 77 (02/17 1700)  Labs: Recent Labs    08/18/21 0102 08/18/21 0618 08/18/21 1513 08/18/21 1806 08/18/21 1820  HGB 10.1* 10.3* 11.4*  --   --   HCT 30.7* 32.2* 36.0  --   --   PLT 165 154 159  --   --   APTT 58* 63*  --   --  80*  HEPARINUNFRC 0.77* 0.85*  --  >1.10*  --      Estimated Creatinine Clearance: 73 mL/min (by C-G formula based on SCr of 0.89 mg/dL).   Medical History: Past Medical History:  Diagnosis Date   Pulmonary embolism (HCC) 01/2009    Assessment: 36 YO female with a 4-day history of left lower extremity paresthesias when walking and some mild left leg swelling. Patient found to have clot in iliac stent. Scheduled for mechanical thrombectomy of stent and possible intervention  today 2/16. Patient was on Eliquis prior to admission and was continued while in patient. Last Eliquis dose 2/15 @ 0929.    2/16- patient is now status post-op placement of thrombolytics catheter in occluded left iliocaval venous stent with initiation of chemical thrombolysis with tPA.  aPTT 63 seconds this morning, within goal range.  Heparin level still falsely elevated from recent Eliquis.  No bleeding or complications noted.  2/17 1933 aPTT 80 seconds at 1820 (within goal range)  Goal of Therapy: (changed due to alteplase discontinuation at 11:53 AM) aPTT 66-102 seconds  Heparin level 0.3 to 0.7 Monitor platelets by anticoagulation protocol: Yes   Plan:  Continue IV heparin at 1000 units/hr. Monitor CBC, aPTT/HL, and s/sx of bleeding  daily Follow up for transtion back to Eliquis when able.  Thank you for allowing pharmacy to be part of this patients care team.  3/17 BS, PharmD, BCPS Clinical Pharmacist  08/18/2021 7:32 PM   [facility] pharmacy phone numbers are listed on [email or web address]

## 2021-08-18 NOTE — Op Note (Signed)
DATE OF SERVICE: 08/18/2021  PATIENT:  Amber Carroll  36 y.o. female  PRE-OPERATIVE DIAGNOSIS: Thrombosis of left iliofemoral venous stenting  POST-OPERATIVE DIAGNOSIS:  Same  PROCEDURE:   1) left leg and pelvic venogram (68mL total contrast) 2) intravascular ultrasound of the inferior vena cava, left common iliac, left external iliac, left common femoral, left femoral veins 3) left common iliac vein stenting and angioplasty (14 x 26mm Abre) 4) Conscious sedation (36 minutes)  SURGEON:  Rande Brunt. Lenell Antu, MD  ASSISTANT: none  ANESTHESIA:   local and IV sedation  ESTIMATED BLOOD LOSS: Minimal  LOCAL MEDICATIONS USED:  LIDOCAINE   COUNTS: confirmed correct.  PATIENT DISPOSITION:  PACU - hemodynamically stable.   Delay start of Pharmacological VTE agent (>24hrs) due to surgical blood loss or risk of bleeding: no  INDICATION FOR PROCEDURE: Amber Carroll is a 36 y.o. female with thrombosis of left iliofemoral venous stenting done at outside facility for May Thurner syndrome.  She is undergoing catheter directed thrombolysis. After careful discussion of risks, benefits, and alternatives the patient was offered catheter recheck and possible intervention. The patient understood and wished to proceed.  OPERATIVE FINDINGS:  Good result from thrombolysis. In-stent restenosis in the mid to distal left common iliac vein on venogram; redemonstrated on intravascular ultrasound. Elected to restent this area with a oversized stent (14 x 40 mm Abre) Good technical result from stenting and angioplasty; resolution of in-stent restenosis.  Brisk flow into the inferior vena cava.  This was redemonstrated on intravascular ultrasound.  DESCRIPTION OF PROCEDURE: After identification of the patient in the pre-operative holding area, the patient was transferred to the operating room. The patient was positioned supine on the operating room table. Anesthesia was induced.  The patient was premedicated for  contrast allergy.  The left popliteal fossa was prepped and draped in standard fashion. A surgical pause was performed confirming correct patient, procedure, and operative location.  The left popliteal fossa was anesthetized with subcutaneous injection of 1% lidocaine.  A Glidewire advantage was advanced through the thrombolysis infusion catheter to the IVC.  The catheter was withdrawn and discarded.  An ascending venogram was performed in stations.  This revealed significant stenosis in the common iliac veins on the left.  I performed plain angioplasty of this with a 12 x 60 mm balloon.  This resolved much of the stenosis, but there is still an area of significant narrowing in the midportion of the stented veins.  I performed an intravascular ultrasound as detailed above.  This redemonstrated the stenosis.  I elected to restent this area.  I chose an oversized stent (14 x 40 mm Abre) and postdilated with a 12 mm Mustang balloon.  Follow-up venogram showed good technical result.  Follow-up intravascular ultrasound confirmed the findings.  Satisfied we ended the case here.  A 3-0 nylon was placed around the access site and a bolstered placed through the nylon suture.  All endovascular equipment was removed.  The bolster dressing was secured down to the skin.  Hemostasis was achieved.  A sterile bandage was applied.  PLAN: Continue anticoagulation with heparin.  Transition to Eliquis tomorrow.  Outpatient follow-up with hematology for thrombophilia work-up.  Rande Brunt. Lenell Antu, MD [facility] Phone Number: [phone] 08/18/2021 12:29 PM

## 2021-08-18 NOTE — Progress Notes (Signed)
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  She denied chest pain or shortness of breath.  She has a history of allergic reaction to IV contrast dye described as swelling and itching of the throat.  She admits to smoking 5-6 cigarettes a day.  Consultants:  Vascular Surgery  Code Status: FULL CODE  Antimicrobials:  None  DVT prophylaxis: Eliquis  Interim Hx: Directed tPA therapy initiated 2/16 after placement of catheter within clot.  Vital signs stable.  Afebrile.  Hemoglobin holding steady.  Assessment & Plan:  Left leg DVT - clotting disorder No evidence of left leg DVT on venous duplex 2/14 -venogram confirmed thrombosis of the patient's ilio caval venous stent -ongoing care per Vascular Surgery -catheter placed in stent/thrombosis 2/16 with ongoing TNA presently being administered  Prior hx of DVT tx as follows: -08/2020: mechanical thrombectomy -11/2020: angioplasty/stenting from the left common iliac vein thru left common femoral vein -office note 10/10/2020 suggests prior PE and L LE DVT induced by OCPs  Protein S deficiency   Clotting disorder Review of records indicates this is a protein S deficiency -some prior clotting episodes were apparently exacerbated by use of OCPs - she has been on lifelong Eliquis and attests to religious compliance -this clotting episode must therefore be considered a failure of Eliquis -the tentative plan at this time is to transition to subcu Lovenox at discharge -this will be discussed with hematology and outpatient hematology follow-up will be arranged prior to  discharge  Tobacco abuse Counseled on absolute need to discontinue tobacco abuse and direct connection to clotting disorders  Normocytic anemia No evidence of gross blood loss -hemoglobin stable   Family Communication: Spoke with mother in room  Disposition: From home -anticipate discharge home after correction of iliac thrombosis  Objective: Blood pressure 135/86, pulse 68, temperature 98.2 F (36.8 C), temperature source Oral, resp. rate 20, height 5\' 3"  (1.6 m), weight 54.2 kg, last menstrual period 08/14/2021, SpO2 96 %.  Intake/Output Summary (Last 24 hours) at 08/18/2021 1046 Last data filed at 08/18/2021 1000 Gross per 24 hour  Intake 1342.97 ml  Output --  Net 1342.97 ml    Filed Weights   08/15/21 1658 08/18/21 0516  Weight: 54.4 kg 54.2 kg    Examination: Pt in cath lab on multiple visits to unit   CBC: Recent Labs  Lab 08/15/21 1713 08/16/21 0645 08/17/21 1813 08/18/21 0102 08/18/21 0618  WBC 8.6   < > 12.0* 10.9* 10.4  NEUTROABS 5.7  --   --   --   --   HGB 10.9*   < > 10.4* 10.1* 10.3*  HCT 34.7*   < > 33.2* 30.7* 32.2*  MCV 92.8   < > 93.0 91.9 93.1  PLT 192   < > 168 165 154   < > = values in this interval not displayed.    Basic Metabolic Panel: Recent Labs  Lab 08/15/21 1713  NA 137  K 3.8  CL 103  CO2 24  GLUCOSE 87  BUN 6  CREATININE 0.89  CALCIUM 9.4    GFR: Estimated Creatinine Clearance:  73 mL/min (by C-G formula based on SCr of 0.89 mg/dL).   Scheduled Meds:  atomoxetine  40 mg Oral q morning   busPIRone  15 mg Oral Daily   escitalopram  10 mg Oral Daily   predniSONE  50 mg Oral TID   sodium chloride flush  3 mL Intravenous Q12H   traZODone  100 mg Oral QHS   Continuous Infusions:  sodium chloride 10 mL/hr at 08/17/21 0800   sodium chloride     alteplase (LIMB ISCHEMIA) 10 mg in normal saline (0.02 mg/mL) infusion 1 mg/hr (08/18/21 1000)   heparin 1,000 Units/hr (08/18/21 1000)     LOS: 1 day   Lonia Blood, MD [facility] Office  [phone] Pager - Text Page per Loretha Stapler  If 7PM-7AM, please contact night-coverage per Amion 08/18/2021, 10:46 AM

## 2021-08-18 NOTE — Progress Notes (Signed)
ANTICOAGULATION CONSULT NOTE - Follow Up Consult  Pharmacy Consult for heparin Indication: DVT now on lysis  Labs: Recent Labs    08/15/21 1713 08/16/21 0645 08/17/21 0207 08/17/21 0944 08/17/21 1812 08/17/21 1813 08/18/21 0102  HGB 10.9*   < > 10.7*  --   --  10.4* 10.1*  HCT 34.7*   < > 32.3*  --   --  33.2* 30.7*  PLT 192   < > 169  --   --  168 165  APTT  --   --  51* 74*  --   --  58*  HEPARINUNFRC  --   --  >1.10*  --  0.94*  --  0.77*  CREATININE 0.89  --   --   --   --   --   --    < > = values in this interval not displayed.     Assessment: 35yo female subtherapeutic on heparin after empiric rate change s/p vascular surgery, now w/ alteplase running via lysis catheter; no infusion issues or signs of bleeding per RN.  Goal of Therapy:  aPTT 60-85 seconds   Plan:  Will increase heparin to previously therapeutic rate of 1000 units/hr and check PTT with next scheduled lab draw.    Vernard Gambles, PharmD, BCPS  08/18/2021,2:36 AM

## 2021-08-18 NOTE — Care Management (Signed)
°  Transition of Care [facility]) Screening Note   Patient Details  Name: Amber Carroll Date of Birth: Mar 08, 1986   Transition of Care [facility]) CM/SW Contact:    Gala Lewandowsky, RN Phone Number: 08/18/2021, 4:24 PM    Transition of Care Department [facility]) has reviewed patient and no TOC needs have been identified at this time. Benefits check submitted for Eliquis cost.

## 2021-08-18 NOTE — Plan of Care (Signed)

## 2021-08-19 DIAGNOSIS — Z9889 Other specified postprocedural states: Secondary | ICD-10-CM | POA: Diagnosis not present

## 2021-08-19 DIAGNOSIS — T82856A Stenosis of peripheral vascular stent, initial encounter: Secondary | ICD-10-CM | POA: Diagnosis not present

## 2021-08-19 DIAGNOSIS — T82868A Thrombosis of vascular prosthetic devices, implants and grafts, initial encounter: Secondary | ICD-10-CM | POA: Diagnosis not present

## 2021-08-19 LAB — BASIC METABOLIC PANEL
Anion gap: 10 (ref 5–15)
BUN: 16 mg/dL (ref 6–20)
CO2: 23 mmol/L (ref 22–32)
Calcium: 8 mg/dL — ABNORMAL LOW (ref 8.9–10.3)
Chloride: 102 mmol/L (ref 98–111)
Creatinine, Ser: 1.09 mg/dL — ABNORMAL HIGH (ref 0.44–1.00)
GFR, Estimated: >60 mL/min (ref >60)
Glucose, Bld: 255 mg/dL — ABNORMAL HIGH (ref 70–99)
Potassium: 3.4 mmol/L — ABNORMAL LOW (ref 3.5–5.1)
Sodium: 135 mmol/L (ref 135–145)

## 2021-08-19 LAB — CBC
HCT: 31.9 % — ABNORMAL LOW (ref 36.0–46.0)
Hemoglobin: 9.9 g/dL — ABNORMAL LOW (ref 12.0–15.0)
MCH: 29 pg (ref 26.0–34.0)
MCHC: 31 g/dL (ref 30.0–36.0)
MCV: 93.5 fL (ref 80.0–100.0)
Platelets: 143 10*3/uL — ABNORMAL LOW (ref 150–400)
RBC: 3.41 MIL/uL — ABNORMAL LOW (ref 3.87–5.11)
RDW: 15 % (ref 11.5–15.5)
WBC: 10.8 10*3/uL — ABNORMAL HIGH (ref 4.0–10.5)
nRBC: 0 % (ref 0.0–0.2)

## 2021-08-19 LAB — LIPID PANEL
Cholesterol: 179 mg/dL (ref 0–200)
HDL: 58 mg/dL (ref >40)
LDL Cholesterol: 109 mg/dL — ABNORMAL HIGH (ref 0–99)
Total CHOL/HDL Ratio: 3.1 RATIO
Triglycerides: 62 mg/dL (ref <150)
VLDL: 12 mg/dL (ref 0–40)

## 2021-08-19 LAB — APTT: aPTT: 68 seconds — ABNORMAL HIGH (ref 24–36)

## 2021-08-19 LAB — HEPARIN LEVEL (UNFRACTIONATED): Heparin Unfractionated: 0.82 IU/mL — ABNORMAL HIGH (ref 0.30–0.70)

## 2021-08-19 MED ORDER — ATORVASTATIN CALCIUM 40 MG PO TABS
40.0000 mg | ORAL_TABLET | Freq: Every day | ORAL | Status: DC
  Administered 2021-08-19: 40 mg via ORAL
  Filled 2021-08-19: qty 1

## 2021-08-19 MED ORDER — APIXABAN 5 MG PO TABS
10.0000 mg | ORAL_TABLET | Freq: Two times a day (BID) | ORAL | Status: DC
  Administered 2021-08-19: 10 mg via ORAL
  Filled 2021-08-19: qty 2

## 2021-08-19 MED ORDER — ACETAMINOPHEN 325 MG PO TABS: 650.0000 mg | ORAL_TABLET | ORAL | Status: DC | PRN

## 2021-08-19 MED ORDER — APIXABAN 5 MG PO TABS
5.0000 mg | ORAL_TABLET | Freq: Two times a day (BID) | ORAL | Status: DC
Start: 2021-08-26 — End: 2021-08-19

## 2021-08-19 MED ORDER — ALUM & MAG HYDROXIDE-SIMETH 200-200-20 MG/5ML PO SUSP
30.0000 mL | ORAL | Status: DC | PRN
  Administered 2021-08-19: 30 mL via ORAL
  Filled 2021-08-19: qty 30

## 2021-08-19 MED ORDER — POTASSIUM CHLORIDE CRYS ER 20 MEQ PO TBCR
40.0000 meq | EXTENDED_RELEASE_TABLET | Freq: Once | ORAL | Status: AC
  Administered 2021-08-19: 40 meq via ORAL
  Filled 2021-08-19: qty 2

## 2021-08-19 MED ORDER — APIXABAN (ELIQUIS) VTE STARTER PACK (10MG AND 5MG): ORAL_TABLET | ORAL | 0 refills | Status: DC

## 2021-08-19 MED ORDER — ATORVASTATIN CALCIUM 40 MG PO TABS
40.0000 mg | ORAL_TABLET | Freq: Every day | ORAL | 2 refills | Status: DC
Start: 2021-08-20 — End: 2022-05-15

## 2021-08-19 NOTE — Progress Notes (Signed)
[facility]  Subjective   - left leg feeling better   Objective 124/80 67 98.4 F (36.9 C) (Oral) 17 98%  Intake/Output Summary (Last 24 hours) at 08/19/2021 5056 Last data filed at 08/19/2021 0500 Gross per 24 hour  Intake 1795.83 ml  Output --  Net 1795.83 ml     Popliteal fossa soft Left DP palpable Left foot motor and sensory intact  Laboratory Lab Results: Recent Labs    08/18/21 1513 08/19/21 0039  WBC 11.2* 10.8*  HGB 11.4* 9.9*  HCT 36.0 31.9*  PLT 159 143*    BMET Recent Labs    08/19/21 0039  NA 135  K 3.4*  CL 102  CO2 23  GLUCOSE 255*  BUN 16  CREATININE 1.09*  CALCIUM 8.0*     COAG Lab Results  Component Value Date   INR 0.9 06/29/2021   INR 1.00 (L) 08/26/2009   INR 0.99 05/31/2009   PROTIME 12.0 08/26/2009   PROTIME 19.2 (H) 05/17/2009   PROTIME 18.0 (H) 02/23/2009   No results found for: PTT  Assessment/Planning:  Postop day 1 status post thrombolysis, recannulization / restenting of occluded left iliac venous stent.   No acute events overnight in the ICU.  Can restart Eliquis. There was in-stent restenosis in the stent which was the likely nidus for occlusion. Pt needs hematology referral for hypercoagulability workup to ensure it is not a contributing factor.  Medically ready for d/c from vascular surgery perspective   Amber Carroll 08/19/2021 8:22 AM --

## 2021-08-19 NOTE — Discharge Summary (Signed)
DISCHARGE SUMMARY  Karl Knarr  MR#: [id number]  DOB:[birth year]  Date of Admission: 08/15/2021 Date of Discharge: 08/19/2021  Attending Physician:Franchesca Veneziano Silvestre Gunner, MD  Patient's LEX:NTZGYF, [name], MD  Consults: Vascular Surgery   Disposition: D/C home    Follow-up Appts:  Follow-up Information     Leonie Douglas, MD Follow up on 09/12/2021.   Specialties: Vascular Surgery, Interventional Cardiology Why: Office will call you to arrange your appt (sent) Contact information: [address] [place] [address] [phone]         Toppin, [name], MD Follow up.   Specialty: Internal Medicine Contact information: [address] [place] [address] [phone]         [facility] Follow up.   Why: The H[facility]ology [facility] at the [facility] will contact you to arrange for an outpatient appointment.                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  She denied chest pain or shortness of breath.  She has a history of allergic reaction to IV contrast dye described as swelling and itching of the throat.  She admits to smoking 5-6 cigarettes a day.  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  No evidence of left leg DVT on venous duplex 2/14 - venogram confirmed thrombosis of the patient's iliocaval venous stent - care per Vascular Surgery -catheter placed in stent/thrombosis 2/16 with TNA infusion -  f/u venogram in cath lab 2/17 noted good results from thrombolysis and in-stent restenosis in the mid to distal left common iliac vein on venogram - area was re-stented w/ oversized stent - cleared to resume Eliquis per Vasc Surgery as clotting felt to be consequence of her re-stenosis and not a failure or Eliquis   Prior hx of DVT tx as follows: -08/2020: mechanical thrombectomy -11/2020: angioplasty/stenting from the left common iliac vein thru left common femoral vein -office note 10/10/2020 suggests prior PE and L LE DVT induced by OCPs  Protein S deficiency    Clotting disorder Review of records indicates this is a protein S deficiency - some prior clotting episodes were apparently exacerbated by use of OCPs - she has been on lifelong Eliquis and attests to religious compliance - outpatient Hematology referral made for ongoing follow up and monitoring or anticoagulation    Tobacco abuse Counseled on absolute need to discontinue tobacco abuse and direct connection to clotting disorders/DVT formation    Normocytic anemia No evidence of gross blood loss - hemoglobin stable  Allergies as of 08/19/2021       Reactions   Ivp Dye [iodinated Contrast Media]    Latex         Medication List     STOP taking these medications    apixaban 5 MG Tabs tablet Commonly known as: ELIQUIS Replaced by: Apixaban Starter Pack (10mg  and 5mg )       TAKE these medications  acetaminophen 325 MG tablet Commonly known as: TYLENOL Take 2 tablets (650 mg total) by mouth every 4 (four) hours as needed for headache or mild pain.   Apixaban Starter Pack (10mg  and 5mg ) Commonly known as: ELIQUIS STARTER PACK Take as directed on package: start with two-5mg  tablets twice daily for 7 days. On day 8, switch to one-5mg  tablet twice daily. Replaces: apixaban 5 MG Tabs tablet   atomoxetine 40 MG capsule Commonly known as: STRATTERA Take 40 mg by mouth every morning.   atorvastatin 40 MG  tablet Commonly known as: LIPITOR Take 1 tablet (40 mg total) by mouth daily. Start taking on: August 20, 2021   busPIRone 15 MG tablet Commonly known as: BUSPAR Take 15 mg by mouth daily.   cetirizine 10 MG tablet Commonly known as: ZyrTEC Allergy Take 1 tablet (10 mg total) by mouth daily.   escitalopram 20 MG tablet Commonly known as: LEXAPRO Take 10 mg by mouth daily.   traZODone 100 MG tablet Commonly known as: DESYREL Take 100 mg by mouth at bedtime.        Day of Discharge BP 124/80    Pulse 67    Temp 98.4 F (36.9 C) (Oral)    Resp 17    Ht 5\' 3"  (1.6 m)    Wt 54.2 kg    LMP 08/14/2021    SpO2 98%    BMI 21.17 kg/m   Physical Exam: General: No acute respiratory distress Lungs: Clear to auscultation bilaterally without wheezes or crackles Cardiovascular: Regular rate and rhythm without murmur gallop or rub normal S1 and S2 Abdomen: Nontender, nondistended, soft, bowel sounds positive, no rebound, no ascites, no appreciable mass Extremities: No significant cyanosis, clubbing, or edema bilateral lower extremities  Basic Metabolic Panel: Recent Labs  Lab 08/15/21 1713 08/19/21 0039  NA 137 135  K 3.8 3.4*  CL 103 102  CO2 24 23  GLUCOSE 87 255*  BUN 6 16  CREATININE 0.89 1.09*  CALCIUM 9.4 8.0*   CBC: Recent Labs  Lab 08/15/21 1713 08/16/21 0645 08/17/21 1813 08/18/21 0102 08/18/21 0618 08/18/21 1513 08/19/21 0039  WBC 8.6   < > 12.0* 10.9* 10.4 11.2* 10.8*  NEUTROABS 5.7  --   --   --   --   --   --   HGB 10.9*   < > 10.4* 10.1* 10.3* 11.4* 9.9*  HCT 34.7*   < > 33.2* 30.7* 32.2* 36.0 31.9*  MCV 92.8   < > 93.0 91.9 93.1 94.7 93.5  PLT 192   < > 168 165 154 159 143*   < > = values in this interval not displayed.    Time spent in discharge (includes decision making & examination of pt): 35 minutes  08/19/2021, 10:59 AM   08/20/21, MD [facility] Office  [phone]

## 2021-08-19 NOTE — Progress Notes (Signed)
ANTICOAGULATION CONSULT NOTE - follow-up  Pharmacy Consult for IV heparin Indication: VTE treatment  Allergies  Allergen Reactions   Ivp Dye [Iodinated Contrast Media]    Latex     Patient Measurements: Height: 5\' 3"  (160 cm) Weight: 54.2 kg (119 lb 7.8 oz) IBW/kg (Calculated) : 52.4 Heparin Dosing Weight: 54.4 kg  Vital Signs: Temp: 98.4 F (36.9 C) (02/18 0800) Temp Source: Oral (02/18 0800) BP: 125/77 (02/18 0500) Pulse Rate: 63 (02/18 0500)  Labs: Recent Labs    08/18/21 0618 08/18/21 1513 08/18/21 1806 08/18/21 1820 08/19/21 0039  HGB 10.3* 11.4*  --   --  9.9*  HCT 32.2* 36.0  --   --  31.9*  PLT 154 159  --   --  143*  APTT 63*  --   --  80* 68*  HEPARINUNFRC 0.85*  --  >1.10*  --  0.82*  CREATININE  --   --   --   --  1.09*     Estimated Creatinine Clearance: 59.6 mL/min (A) (by C-G formula based on SCr of 1.09 mg/dL (H)).   Medical History: Past Medical History:  Diagnosis Date   Pulmonary embolism (HCC) 01/2009    Assessment: 36 YO female with a 4-day history of left lower extremity paresthesias when walking and some mild left leg swelling. Patient found to have clot in iliac stent. Patient was on Eliquis prior to admission and was continued while in patient. Last Eliquis dose 2/15 @ 0929.    2/16- patient is now status post-op placement of thrombolytics catheter in occluded left iliocaval venous stent with initiation of chemical thrombolysis with tPA.  aPTT 68 seconds this morning, within goal range.  Heparin level still falsely elevated from recent Eliquis.  Hgb down to 9.9, plts 143. No bleeding or complications noted.   Discussed with vascular and will transition back to Eliquis today. Will re-load given new DVT.   Goal of Therapy: (changed due to alteplase discontinuation at 11:53 AM) aPTT 66-102 seconds  Heparin level 0.3 to 0.7 Monitor platelets by anticoagulation protocol: Yes   Plan:  Start Eliquis 10 mg BID x 7 days then 5 mg  BID Stop heparin when first dose Eliquis given  Thank you for allowing pharmacy to be part of this patients care team.  3/16, PharmD PGY2 Cardiology Pharmacy Resident Phone: [phone]  08/19/2021  8:49 AM  Please check [email or web address] for unit-specific pharmacy phone numbers.  [facility] pharmacy phone numbers are listed on [email or web address]

## 2021-08-19 NOTE — Progress Notes (Signed)
PHARMACIST LIPID MONITORING   Amber Carroll is a 36 y.o. female admitted on 08/15/2021 with recurrent LLE DVT.  Pharmacy has been consulted to optimize lipid-lowering therapy with the indication of secondary prevention for clinical ASCVD.  Recent Labs:  Lipid Panel (last 6 months):   Lab Results  Component Value Date   CHOL 179 08/19/2021   TRIG 62 08/19/2021   HDL 58 08/19/2021   CHOLHDL 3.1 08/19/2021   VLDL 12 08/19/2021   LDLCALC 109 (H) 08/19/2021    Hepatic function panel (last 6 months):   No results found for: AST, ALT, ALKPHOS, BILITOT, BILIDIR, IBILI  SCr (since admission):   Serum creatinine: 1.09 mg/dL (H) 08/19/21 0039 Estimated creatinine clearance: 59.6 mL/min (A)  Current therapy and lipid therapy tolerance Current lipid-lowering therapy: none Previous lipid-lowering therapies (if applicable): none Documented or reported allergies or intolerances to lipid-lowering therapies (if applicable): none  Assessment:   Patient agrees with changes to lipid-lowering therapy  Plan:    1.Statin intensity (high intensity recommended for all patients regardless of the LDL):  Add or increase statin to high intensity.  2.Add ezetimibe (if any one of the following):   Not indicated at this time.  3.Refer to lipid clinic:   No  4.Follow-up with:  Primary care provider - Toppin, Antionette Poles, MD  5.Follow-up labs after discharge:  Changes in lipid therapy were made. Check a lipid panel in 8-12 weeks then annually.       Aleene Davidson, PharmD 08/19/2021, 8:22 AM

## 2021-08-19 NOTE — Progress Notes (Incomplete)
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  She denied chest pain or shortness of breath.  She has a history of allergic reaction to IV contrast dye described as swelling and itching of the throat.  She admits to smoking 5-6 cigarettes a day.  Consultants:  Vascular Surgery  Code Status: FULL CODE  Antimicrobials:  None  DVT prophylaxis: Eliquis  Interim Hx: Underwent f/u venogram 2/17 w/ L common iliac vein angioplasty and stenting (re-stenting).  Assessment & Plan:  Left leg DVT - clotting disorder No evidence of left leg DVT on venous duplex 2/14 - venogram confirmed thrombosis of the patient's iliocaval venous stent -ongoing care per Vascular Surgery -catheter placed in stent/thrombosis 2/16 with TNA infusion - f/u venogram in cath lab 2/17 noted good results from thrombolysis and in-stent restenosis in the mid to distal left common iliac vein on venogram - area was re-stented w/ oversized stent   Prior hx of DVT tx as follows: -08/2020: mechanical thrombectomy -11/2020: angioplasty/stenting from the left common iliac vein thru left common femoral vein -office note 10/10/2020 suggests prior PE and L LE DVT induced by OCPs  Protein S deficiency   Clotting disorder Review of records indicates this is a protein S deficiency -some prior clotting episodes were apparently exacerbated by use of OCPs - she has been on lifelong Eliquis and attests to religious compliance - outpatient Hematology follow-up will be arranged   Tobacco abuse Counseled on absolute need to discontinue tobacco abuse and direct  connection to clotting disorders  Normocytic anemia No evidence of gross blood loss -hemoglobin stable   Family Communication:  Disposition: From home -anticipate discharge home after correction of iliac thrombosis  Objective: Blood pressure 125/77, pulse 63, temperature 98.5 F (36.9 C), temperature source Oral, resp. rate (!) 24, height 5\' 3"  (1.6 m), weight 54.2 kg, last menstrual period 08/14/2021, SpO2 95 %.  Intake/Output Summary (Last 24 hours) at 08/19/2021 0801 Last data filed at 08/19/2021 0500 Gross per 24 hour  Intake 1795.83 ml  Output --  Net 1795.83 ml    Filed Weights   08/15/21 1658 08/18/21 0516  Weight: 54.4 kg 54.2 kg    Examination: General: No acute respiratory distress Lungs: Clear to auscultation bilaterally without wheezes or crackles Cardiovascular: Regular rate and rhythm without murmur gallop or rub normal S1 and S2 Abdomen: Nontender, nondistended, soft, bowel sounds positive, no rebound, no ascites, no appreciable mass Extremities: No significant cyanosis, clubbing, or edema bilateral lower extremities  CBC: Recent Labs  Lab 08/15/21 1713 08/16/21 0645 08/18/21 0618 08/18/21 1513 08/19/21 0039  WBC 8.6   < > 10.4 11.2* 10.8*  NEUTROABS 5.7  --   --   --   --   HGB 10.9*   < > 10.3* 11.4* 9.9*  HCT 34.7*   < > 32.2* 36.0 31.9*  MCV 92.8   < > 93.1 94.7 93.5  PLT 192   < > 154 159 143*   < > = values in this interval not displayed.    Basic Metabolic Panel: Recent Labs  Lab 08/15/21 1713 08/19/21 0039  NA 137 135  K 3.8 3.4*  CL 103 102  CO2 24 23  GLUCOSE 87 255*  BUN 6 16  CREATININE 0.89 1.09*  CALCIUM 9.4 8.0*    GFR: Estimated Creatinine Clearance: 59.6 mL/min (A) (by C-G formula based on SCr of 1.09 mg/dL (H)).   Scheduled Meds:  atomoxetine  40 mg Oral q morning   busPIRone  15 mg Oral Daily   Chlorhexidine Gluconate Cloth  6 each Topical Daily   escitalopram  10 mg Oral Daily   predniSONE  50 mg Oral TID    sodium chloride flush  3 mL Intravenous Q12H   sodium chloride flush  3 mL Intravenous Q12H   traZODone  100 mg Oral QHS   Continuous Infusions:  sodium chloride 10 mL/hr at 08/17/21 0800   sodium chloride     sodium chloride     heparin 1,000 Units/hr (08/19/21 0500)     LOS: 2 days   Lonia Blood, MD [facility] Office  [phone] Pager - Text Page per Loretha Stapler  If 7PM-7AM, please contact night-coverage per Amion 08/19/2021, 8:01 AM

## 2021-08-21 ENCOUNTER — Ambulatory Visit (HOSPITAL_COMMUNITY): Payer: 59 | Admitting: Clinical

## 2021-08-21 ENCOUNTER — Encounter (HOSPITAL_COMMUNITY): Payer: Self-pay

## 2021-08-22 ENCOUNTER — Telehealth: Payer: Self-pay | Admitting: *Deleted

## 2021-08-22 ENCOUNTER — Telehealth: Payer: Self-pay | Admitting: Hematology and Oncology

## 2021-08-22 NOTE — Telephone Encounter (Signed)
Received call from pt with complaint of lightheadedness while ambulating.  Pt states she is going to establish care with MD this week but is seeking advice.  Per MD educate pt on hydration and nutrition as well as obtaining blood pressure to help identify root cause.  Pt verbalized understanding.

## 2021-08-22 NOTE — Telephone Encounter (Signed)
Scheduled appt per 2/18 referral. Pt is aware of appt date and time. Pt is aware to arrive 15 mins prior to appt time and to bring and updated insurance card. Pt is aware of appt location.
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
## 2021-08-25 ENCOUNTER — Inpatient Hospital Stay: Payer: 59 | Attending: Hematology and Oncology | Admitting: Hematology and Oncology

## 2021-08-25 ENCOUNTER — Inpatient Hospital Stay: Payer: 59

## 2021-08-25 ENCOUNTER — Encounter: Payer: Self-pay | Admitting: Hematology and Oncology

## 2021-08-25 ENCOUNTER — Other Ambulatory Visit: Payer: Self-pay

## 2021-08-25 DIAGNOSIS — Z7901 Long term (current) use of anticoagulants: Secondary | ICD-10-CM | POA: Insufficient documentation

## 2021-08-25 DIAGNOSIS — F17210 Nicotine dependence, cigarettes, uncomplicated: Secondary | ICD-10-CM | POA: Insufficient documentation

## 2021-08-25 DIAGNOSIS — I82409 Acute embolism and thrombosis of unspecified deep veins of unspecified lower extremity: Secondary | ICD-10-CM | POA: Insufficient documentation

## 2021-08-25 DIAGNOSIS — Z86711 Personal history of pulmonary embolism: Secondary | ICD-10-CM | POA: Diagnosis not present

## 2021-08-25 DIAGNOSIS — D6861 Antiphospholipid syndrome: Secondary | ICD-10-CM | POA: Diagnosis not present

## 2021-08-25 DIAGNOSIS — Z86718 Personal history of other venous thrombosis and embolism: Secondary | ICD-10-CM | POA: Insufficient documentation

## 2021-08-25 DIAGNOSIS — R69 Illness, unspecified: Secondary | ICD-10-CM | POA: Diagnosis not present

## 2021-08-25 NOTE — Assessment & Plan Note (Signed)
History of PE and left lower extremity DVT: Related to protein S deficiency and oral contraceptive pills March 2022: Mechanical thrombectomy June 2022: Angioplasty/stent from left common iliac vein through left common femoral vein Hospitalization 08/15/2021-08/19/2021 in-stent stenosis of mid to distal left common iliac vein status post restenting  Current treatment: Eliquis lifelong anticoagulation I discussed with her that if Eliquis fails to control her clotting disorder then she may need to consider Lovenox injections.  Return to clinic on an as-needed basis

## 2021-08-26 LAB — LUPUS ANTICOAGULANT PANEL
DRVVT: 53.2 s — ABNORMAL HIGH (ref 0.0–47.0)
PTT Lupus Anticoagulant: 51.3 s — ABNORMAL HIGH (ref 0.0–43.5)

## 2021-08-26 LAB — PTT-LA MIX: PTT-LA Mix: 51.4 s — ABNORMAL HIGH (ref 0.0–40.5)

## 2021-08-26 LAB — HEXAGONAL PHASE PHOSPHOLIPID: Hexagonal Phase Phospholipid: 6 s (ref 0–11)

## 2021-08-26 LAB — DRVVT CONFIRM: dRVVT Confirm: 1.1 ratio (ref 0.8–1.2)

## 2021-08-26 LAB — DRVVT MIX: dRVVT Mix: 45.5 s — ABNORMAL HIGH (ref 0.0–40.4)

## 2021-08-27 LAB — CARDIOLIPIN ANTIBODIES, IGG, IGM, IGA
Anticardiolipin IgA: <9 APL U/mL (ref 0–11)
Anticardiolipin IgG: <9 GPL U/mL (ref 0–14)
Anticardiolipin IgM: <9 MPL U/mL (ref 0–12)

## 2021-08-28 ENCOUNTER — Encounter: Payer: Self-pay | Admitting: *Deleted

## 2021-08-30 ENCOUNTER — Other Ambulatory Visit: Payer: Self-pay

## 2021-08-30 ENCOUNTER — Inpatient Hospital Stay: Payer: 59 | Attending: Hematology and Oncology | Admitting: Adult Health

## 2021-08-30 ENCOUNTER — Encounter: Payer: Self-pay | Admitting: Adult Health

## 2021-08-30 DIAGNOSIS — I8290 Acute embolism and thrombosis of unspecified vein: Secondary | ICD-10-CM | POA: Diagnosis not present

## 2021-08-30 DIAGNOSIS — I82409 Acute embolism and thrombosis of unspecified deep veins of unspecified lower extremity: Secondary | ICD-10-CM | POA: Diagnosis not present

## 2021-08-30 DIAGNOSIS — I82412 Acute embolism and thrombosis of left femoral vein: Secondary | ICD-10-CM

## 2021-08-30 DIAGNOSIS — I82422 Acute embolism and thrombosis of left iliac vein: Secondary | ICD-10-CM

## 2021-08-30 MED ORDER — APIXABAN 5 MG PO TABS: 5.0000 mg | ORAL_TABLET | Freq: Two times a day (BID) | ORAL | 5 refills | Status: DC
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
## 2021-08-30 NOTE — Progress Notes (Signed)
Letter printed and mailed to pt regarding testing for her children.   ?

## 2021-08-31 NOTE — Assessment & Plan Note (Signed)
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 ?Current treatment: Eliquis lifelong anticoagulation ? ?She will continue on Eliquis BID.  She is tolerating it well.  I reviewed if it doesn't work she will need Lovenox injections.  I sent a refill into her pharmacy for 5mg  po bid.  I placed a referral for her to establish with a primary care in [place].  ? ?We will see her back in 6 months for f/u.   ?

## 2021-09-12 ENCOUNTER — Encounter: Payer: 59 | Admitting: Vascular Surgery

## 2021-09-12 ENCOUNTER — Encounter (HOSPITAL_COMMUNITY): Payer: 59

## 2021-09-15 DIAGNOSIS — R69 Illness, unspecified: Secondary | ICD-10-CM | POA: Diagnosis not present

## 2021-09-15 DIAGNOSIS — F411 Generalized anxiety disorder: Secondary | ICD-10-CM | POA: Diagnosis not present
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
## 2021-10-03 ENCOUNTER — Ambulatory Visit (HOSPITAL_COMMUNITY)
Admission: RE | Admit: 2021-10-03 | Discharge: 2021-10-03 | Disposition: A | Discharge status: Home / Self Care | Payer: 59 | Source: Ambulatory Visit | Attending: Vascular Surgery | Admitting: Vascular Surgery

## 2021-10-03 ENCOUNTER — Ambulatory Visit (INDEPENDENT_AMBULATORY_CARE_PROVIDER_SITE_OTHER): Payer: 59 | Admitting: Vascular Surgery

## 2021-10-03 VITALS — BP 100/62 | HR 75 | Temp 98.7°F | Resp 20 | Ht 63.0 in | Wt 112.0 lb

## 2021-10-03 DIAGNOSIS — I871 Compression of vein: Secondary | ICD-10-CM

## 2021-10-03 DIAGNOSIS — I82422 Acute embolism and thrombosis of left iliac vein: Secondary | ICD-10-CM | POA: Insufficient documentation

## 2021-10-09 DIAGNOSIS — R69 Illness, unspecified: Secondary | ICD-10-CM | POA: Diagnosis not present

## 2021-10-09 DIAGNOSIS — F411 Generalized anxiety disorder: Secondary | ICD-10-CM | POA: Diagnosis not present

## 2021-10-11 DIAGNOSIS — J4520 Mild intermittent asthma, uncomplicated: Secondary | ICD-10-CM | POA: Diagnosis not present

## 2021-10-11 DIAGNOSIS — H1013 Acute atopic conjunctivitis, bilateral: Secondary | ICD-10-CM | POA: Diagnosis not present

## 2021-10-11 DIAGNOSIS — J301 Allergic rhinitis due to pollen: Secondary | ICD-10-CM | POA: Diagnosis not present

## 2021-11-06 ENCOUNTER — Other Ambulatory Visit: Payer: Self-pay | Admitting: *Deleted

## 2021-11-06 DIAGNOSIS — M79661 Pain in right lower leg: Secondary | ICD-10-CM

## 2021-11-06 NOTE — Progress Notes (Signed)
Amber Carroll c/o right leg pain and swelling for 24 hours above right knee (frontal).  She states it is very painful to go down stairs.  Amber Carroll denies falls, accidents, blows to right leg.  Amber Carroll has history of  DVTs bilaterally and PE.  Amber Carroll states she had DVT right leg in 2010.  Amber Carroll has history of recurrent DVTs Left leg with thrombectomy in February 2023. Amber Carroll is on Eliquis 5 mg BID.  Venous duplex (DVT study) scheduled at [facility] on 11-07-2021 at 8:00 AM with arrival at 7:45 AM.  Detailed instructions given to patient.  [facility] FU appointment scheduled with Dr. Lenell Antu on 11-07-2021 at 1:20 PM with arrival time at [facility] of 1:00 PM.  Patient aware of date and times of appointments. Amber Carroll verbalized understanding.   ?

## 2021-11-07 ENCOUNTER — Ambulatory Visit (HOSPITAL_COMMUNITY)
Admission: RE | Admit: 2021-11-07 | Discharge: 2021-11-07 | Disposition: A | Discharge status: Home / Self Care | Payer: 59 | Source: Ambulatory Visit | Attending: Vascular Surgery | Admitting: Vascular Surgery

## 2021-11-07 ENCOUNTER — Ambulatory Visit: Payer: 59 | Admitting: Vascular Surgery

## 2021-11-07 ENCOUNTER — Encounter: Payer: Self-pay | Admitting: Vascular Surgery

## 2021-11-07 VITALS — BP 102/68 | HR 89 | Temp 98.4°F | Resp 20 | Ht 63.0 in | Wt 111.0 lb

## 2021-11-07 DIAGNOSIS — M79661 Pain in right lower leg: Secondary | ICD-10-CM | POA: Diagnosis not present

## 2021-11-07 DIAGNOSIS — J3081 Allergic rhinitis due to animal (cat) (dog) hair and dander: Secondary | ICD-10-CM | POA: Diagnosis not present

## 2021-11-07 DIAGNOSIS — J301 Allergic rhinitis due to pollen: Secondary | ICD-10-CM | POA: Diagnosis not present

## 2021-11-07 DIAGNOSIS — J3089 Other allergic rhinitis: Secondary | ICD-10-CM | POA: Diagnosis not present

## 2021-11-07 DIAGNOSIS — M7989 Other specified soft tissue disorders: Secondary | ICD-10-CM | POA: Insufficient documentation

## 2021-11-07 DIAGNOSIS — M25561 Pain in right knee: Secondary | ICD-10-CM | POA: Diagnosis not present
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
## 2021-11-15 DIAGNOSIS — J0140 Acute pansinusitis, unspecified: Secondary | ICD-10-CM | POA: Diagnosis not present

## 2021-11-30 NOTE — Progress Notes (Signed)
Faxed referral to Dr Chales Abrahams at [facility] [phone]

## 2021-12-01 ENCOUNTER — Telehealth: Payer: Self-pay | Admitting: *Deleted

## 2021-12-01 NOTE — Telephone Encounter (Signed)
Received call from Dr. Fanny Skates office stating they will accept pt referral.  Stated pt office visit is scheduled January 22, 2022 at 2pm at their [address] [address] [place] office.  Pt notified and verbalized understanding.

## 2021-12-13 ENCOUNTER — Ambulatory Visit: Payer: 59 | Admitting: Psychology

## 2021-12-13 NOTE — Progress Notes (Signed)
This writer contacted the patient via telephone to check-in as they did not present for the appointment. The patient was unable to be reached. A HIPAA-compliant voicemail was left noting this writer would stay on the appointment until 15 minutes after the scheduled appointment time, but after this time, the appointment would need to be rescheduled.                Lanise Mergen T Khadeem Rockett, PsyD 

## 2021-12-14 ENCOUNTER — Ambulatory Visit: Payer: 59 | Admitting: Nurse Practitioner

## 2021-12-20 ENCOUNTER — Ambulatory Visit: Payer: 59 | Admitting: Psychology

## 2022-03-02 ENCOUNTER — Inpatient Hospital Stay: Payer: 59 | Admitting: Adult Health

## 2022-03-02 ENCOUNTER — Inpatient Hospital Stay: Payer: 59

## 2022-05-13 ENCOUNTER — Emergency Department (HOSPITAL_COMMUNITY): Payer: 59

## 2022-05-13 ENCOUNTER — Inpatient Hospital Stay (HOSPITAL_COMMUNITY)
Admission: EM | Admit: 2022-05-13 | Discharge: 2022-05-15 | DRG: 271 | Disposition: A | Discharge status: Home / Self Care | Payer: 59 | Attending: Vascular Surgery | Admitting: Vascular Surgery

## 2022-05-13 ENCOUNTER — Other Ambulatory Visit: Payer: Self-pay

## 2022-05-13 ENCOUNTER — Encounter (HOSPITAL_COMMUNITY): Payer: Self-pay | Admitting: Pharmacy Technician

## 2022-05-13 DIAGNOSIS — D6859 Other primary thrombophilia: Secondary | ICD-10-CM | POA: Diagnosis present

## 2022-05-13 DIAGNOSIS — Y831 Surgical operation with implant of artificial internal device as the cause of abnormal reaction of the patient, or of later complication, without mention of misadventure at the time of the procedure: Secondary | ICD-10-CM | POA: Diagnosis present

## 2022-05-13 DIAGNOSIS — Z91040 Latex allergy status: Secondary | ICD-10-CM

## 2022-05-13 DIAGNOSIS — D500 Iron deficiency anemia secondary to blood loss (chronic): Secondary | ICD-10-CM | POA: Diagnosis not present

## 2022-05-13 DIAGNOSIS — T82856A Stenosis of peripheral vascular stent, initial encounter: Principal | ICD-10-CM | POA: Diagnosis present

## 2022-05-13 DIAGNOSIS — T82867A Thrombosis of cardiac prosthetic devices, implants and grafts, initial encounter: Secondary | ICD-10-CM | POA: Diagnosis not present

## 2022-05-13 DIAGNOSIS — F17210 Nicotine dependence, cigarettes, uncomplicated: Secondary | ICD-10-CM | POA: Diagnosis present

## 2022-05-13 DIAGNOSIS — T82868A Thrombosis of vascular prosthetic devices, implants and grafts, initial encounter: Secondary | ICD-10-CM | POA: Diagnosis not present

## 2022-05-13 DIAGNOSIS — I871 Compression of vein: Secondary | ICD-10-CM | POA: Diagnosis present

## 2022-05-13 DIAGNOSIS — I82409 Acute embolism and thrombosis of unspecified deep veins of unspecified lower extremity: Secondary | ICD-10-CM | POA: Diagnosis present

## 2022-05-13 DIAGNOSIS — O2230 Deep phlebothrombosis in pregnancy, unspecified trimester: Secondary | ICD-10-CM | POA: Diagnosis present

## 2022-05-13 DIAGNOSIS — Z86711 Personal history of pulmonary embolism: Secondary | ICD-10-CM

## 2022-05-13 DIAGNOSIS — Z888 Allergy status to other drugs, medicaments and biological substances status: Secondary | ICD-10-CM

## 2022-05-13 DIAGNOSIS — Z91018 Allergy to other foods: Secondary | ICD-10-CM

## 2022-05-13 DIAGNOSIS — Z91041 Radiographic dye allergy status: Secondary | ICD-10-CM

## 2022-05-13 LAB — CBC WITH DIFFERENTIAL/PLATELET
Abs Immature Granulocytes: 0.02 10*3/uL (ref 0.00–0.07)
Basophils Absolute: 0 10*3/uL (ref 0.0–0.1)
Basophils Relative: 1 %
Eosinophils Absolute: 0.1 10*3/uL (ref 0.0–0.5)
Eosinophils Relative: 2 %
HCT: 35.4 % — ABNORMAL LOW (ref 36.0–46.0)
Hemoglobin: 11.4 g/dL — ABNORMAL LOW (ref 12.0–15.0)
Immature Granulocytes: 0 %
Lymphocytes Relative: 23 %
Lymphs Abs: 1.8 10*3/uL (ref 0.7–4.0)
MCH: 29.5 pg (ref 26.0–34.0)
MCHC: 32.2 g/dL (ref 30.0–36.0)
MCV: 91.5 fL (ref 80.0–100.0)
Monocytes Absolute: 0.4 10*3/uL (ref 0.1–1.0)
Monocytes Relative: 5 %
Neutro Abs: 5.3 10*3/uL (ref 1.7–7.7)
Neutrophils Relative %: 69 %
Platelets: 197 10*3/uL (ref 150–400)
RBC: 3.87 MIL/uL (ref 3.87–5.11)
RDW: 14.9 % (ref 11.5–15.5)
WBC: 7.7 10*3/uL (ref 4.0–10.5)
nRBC: 0 % (ref 0.0–0.2)

## 2022-05-13 LAB — I-STAT CHEM 8, ED
BUN: 6 mg/dL (ref 6–20)
Calcium, Ion: 1.09 mmol/L — ABNORMAL LOW (ref 1.15–1.40)
Chloride: 107 mmol/L (ref 98–111)
Creatinine, Ser: 0.8 mg/dL (ref 0.44–1.00)
Glucose, Bld: 110 mg/dL — ABNORMAL HIGH (ref 70–99)
HCT: 35 % — ABNORMAL LOW (ref 36.0–46.0)
Hemoglobin: 11.9 g/dL — ABNORMAL LOW (ref 12.0–15.0)
Potassium: 3.7 mmol/L (ref 3.5–5.1)
Sodium: 139 mmol/L (ref 135–145)
TCO2: 20 mmol/L — ABNORMAL LOW (ref 22–32)

## 2022-05-13 LAB — I-STAT BETA HCG BLOOD, ED (MC, WL, AP ONLY): I-stat hCG, quantitative: <5 m[IU]/mL (ref <5)

## 2022-05-13 LAB — BASIC METABOLIC PANEL
Anion gap: 8 (ref 5–15)
BUN: 6 mg/dL (ref 6–20)
CO2: 21 mmol/L — ABNORMAL LOW (ref 22–32)
Calcium: 9 mg/dL (ref 8.9–10.3)
Chloride: 108 mmol/L (ref 98–111)
Creatinine, Ser: 0.72 mg/dL (ref 0.44–1.00)
GFR, Estimated: >60 mL/min (ref >60)
Glucose, Bld: 112 mg/dL — ABNORMAL HIGH (ref 70–99)
Potassium: 3.7 mmol/L (ref 3.5–5.1)
Sodium: 137 mmol/L (ref 135–145)

## 2022-05-13 MED ORDER — METHYLPREDNISOLONE SODIUM SUCC 125 MG IJ SOLR
40.0000 mg | Freq: Once | INTRAMUSCULAR | Status: AC
  Administered 2022-05-13: 40 mg via INTRAVENOUS
  Filled 2022-05-13: qty 2

## 2022-05-13 MED ORDER — IOHEXOL 350 MG/ML SOLN
125.0000 mL | Freq: Once | INTRAVENOUS | Status: AC | PRN
  Administered 2022-05-13: 125 mL via INTRAVENOUS

## 2022-05-13 MED ORDER — DIPHENHYDRAMINE HCL 50 MG/ML IJ SOLN
50.0000 mg | Freq: Once | INTRAMUSCULAR | Status: AC
  Administered 2022-05-13: 50 mg via INTRAVENOUS
  Filled 2022-05-13: qty 1

## 2022-05-13 MED ORDER — DIPHENHYDRAMINE HCL 25 MG PO CAPS: 50.0000 mg | ORAL_CAPSULE | Freq: Once | ORAL | Status: AC

## 2022-05-13 NOTE — ED Triage Notes (Signed)
Pt here with hx of blood clot to leg. Pt had a stent placed which has collapsed in the past.  Pt states she is having L leg/groin pain and numbness which is the same pain she had with her previous clot. Takes eliquis, denies missing any doses. Denies shob.

## 2022-05-13 NOTE — ED Provider Triage Note (Addendum)
Emergency Medicine Provider Triage Evaluation Note  Jaquelyne Firkus , a 36 y.o. female  was evaluated in triage.  Pt complains of left lower extremity swelling pain and numbness. History of hypercoagulable state, May Turner syndrome.  She has a history of stent placement in the left iliac vein, stent thrombosis and stent failure with replacement earlier this year.  She is followed by hematology and Dr. Lenell Antu of vascular surgery.  She has had several weeks of progressively worsening symptoms but was [place] of state.  She is concerned that she has restenosis of the stent and or failure because her symptoms are the same as previous episodes.  She takes Eliquis and is compliant  Review of Systems  Positive: Leg pain Negative: fever  Physical Exam  BP 112/71 (BP Location: Right Arm)   Pulse 88   Temp 98.8 F (37.1 C)   Resp 16   SpO2 100%  Gen:   Awake, no distress   Resp:  Normal effort  MSK:   Moves extremities without difficulty  Other:  No cp sob  Medical Decision Making  Medically screening exam initiated at 4:15 PM.  Appropriate orders placed.  Jackie Russman was informed that the remainder of the evaluation will be completed by another provider, this initial triage assessment does not replace that evaluation, and the importance of remaining in the ED until their evaluation is complete.   4:46 PM Of note- premedications ordered for contrast allergy    Arthor Captain, PA-C 05/13/22 1626    Arthor Captain, PA-C 05/13/22 1646

## 2022-05-14 ENCOUNTER — Encounter (HOSPITAL_COMMUNITY): Payer: Self-pay

## 2022-05-14 ENCOUNTER — Encounter (HOSPITAL_COMMUNITY)
Admission: EM | Disposition: A | Discharge status: Home / Self Care | Payer: Self-pay | Source: Home / Self Care | Attending: Vascular Surgery

## 2022-05-14 DIAGNOSIS — T82868A Thrombosis of vascular prosthetic devices, implants and grafts, initial encounter: Secondary | ICD-10-CM | POA: Diagnosis not present

## 2022-05-14 DIAGNOSIS — I82409 Acute embolism and thrombosis of unspecified deep veins of unspecified lower extremity: Secondary | ICD-10-CM | POA: Diagnosis present

## 2022-05-14 HISTORY — PX: PERIPHERAL VASCULAR THROMBECTOMY: CATH118306

## 2022-05-14 SURGERY — PERIPHERAL VASCULAR THROMBECTOMY
Anesthesia: LOCAL

## 2022-05-14 MED ORDER — MIDAZOLAM HCL 2 MG/2ML IJ SOLN
INTRAMUSCULAR | Status: AC
  Filled 2022-05-14: qty 2

## 2022-05-14 MED ORDER — HEPARIN (PORCINE) IN NACL 1000-0.9 UT/500ML-% IV SOLN
INTRAVENOUS | Status: AC
  Filled 2022-05-14: qty 500

## 2022-05-14 MED ORDER — HEPARIN (PORCINE) 25000 UT/250ML-% IV SOLN
800.0000 [IU]/h | INTRAVENOUS | Status: DC
  Administered 2022-05-14: 1100 [IU]/h via INTRAVENOUS
  Filled 2022-05-14: qty 250

## 2022-05-14 MED ORDER — DIPHENHYDRAMINE HCL 50 MG/ML IJ SOLN
25.0000 mg | Freq: Once | INTRAMUSCULAR | Status: AC
  Administered 2022-05-14: 25 mg via INTRAVENOUS
  Filled 2022-05-14: qty 1

## 2022-05-14 MED ORDER — SODIUM CHLORIDE 0.9% FLUSH: 3.0000 mL | INTRAVENOUS | Status: DC | PRN

## 2022-05-14 MED ORDER — ASPIRIN 81 MG PO TBEC
81.0000 mg | DELAYED_RELEASE_TABLET | Freq: Every day | ORAL | Status: DC
  Administered 2022-05-15: 81 mg via ORAL
  Filled 2022-05-14: qty 1

## 2022-05-14 MED ORDER — METHYLPREDNISOLONE SODIUM SUCC 125 MG IJ SOLR
125.0000 mg | Freq: Once | INTRAMUSCULAR | Status: AC
  Administered 2022-05-14: 125 mg via INTRAVENOUS
  Filled 2022-05-14: qty 2

## 2022-05-14 MED ORDER — ALPRAZOLAM 0.5 MG PO TABS
0.5000 mg | ORAL_TABLET | Freq: Two times a day (BID) | ORAL | Status: DC | PRN
  Administered 2022-05-14: 0.5 mg via ORAL
  Filled 2022-05-14: qty 1

## 2022-05-14 MED ORDER — HYDRALAZINE HCL 20 MG/ML IJ SOLN: 5.0000 mg | INTRAMUSCULAR | Status: DC | PRN

## 2022-05-14 MED ORDER — IODIXANOL 320 MG/ML IV SOLN
INTRAVENOUS | Status: DC | PRN
  Administered 2022-05-14: 55 mL via INTRAVENOUS

## 2022-05-14 MED ORDER — SODIUM CHLORIDE 0.9 % IV SOLN: 250.0000 mL | INTRAVENOUS | Status: DC | PRN

## 2022-05-14 MED ORDER — ACETAMINOPHEN 325 MG PO TABS: 650.0000 mg | ORAL_TABLET | ORAL | Status: DC | PRN

## 2022-05-14 MED ORDER — ALBUTEROL SULFATE (2.5 MG/3ML) 0.083% IN NEBU: 3.0000 mL | INHALATION_SOLUTION | Freq: Four times a day (QID) | RESPIRATORY_TRACT | Status: DC | PRN

## 2022-05-14 MED ORDER — SODIUM CHLORIDE 0.9 % IV SOLN
INTRAVENOUS | Status: AC | PRN
  Administered 2022-05-14: 500 mL/h
  Administered 2022-05-14: 500 mL/h via INTRAVENOUS

## 2022-05-14 MED ORDER — LIDOCAINE HCL (PF) 1 % IJ SOLN
INTRAMUSCULAR | Status: AC
  Filled 2022-05-14: qty 30

## 2022-05-14 MED ORDER — HEPARIN (PORCINE) IN NACL 1000-0.9 UT/500ML-% IV SOLN
INTRAVENOUS | Status: DC | PRN
  Administered 2022-05-14 (×2): 500 mL

## 2022-05-14 MED ORDER — HEPARIN SODIUM (PORCINE) 1000 UNIT/ML IJ SOLN
INTRAMUSCULAR | Status: AC
  Filled 2022-05-14: qty 10

## 2022-05-14 MED ORDER — APIXABAN 5 MG PO TABS: 5.0000 mg | ORAL_TABLET | Freq: Two times a day (BID) | ORAL | Status: DC

## 2022-05-14 MED ORDER — FENTANYL CITRATE (PF) 100 MCG/2ML IJ SOLN
INTRAMUSCULAR | Status: AC
  Filled 2022-05-14: qty 2

## 2022-05-14 MED ORDER — SODIUM CHLORIDE 0.9% FLUSH
3.0000 mL | Freq: Two times a day (BID) | INTRAVENOUS | Status: DC
  Administered 2022-05-14 – 2022-05-15 (×2): 3 mL via INTRAVENOUS

## 2022-05-14 MED ORDER — ACETAMINOPHEN 325 MG PO TABS
650.0000 mg | ORAL_TABLET | ORAL | Status: DC | PRN
  Administered 2022-05-14: 650 mg via ORAL
  Filled 2022-05-14: qty 2

## 2022-05-14 MED ORDER — LORATADINE 10 MG PO TABS
10.0000 mg | ORAL_TABLET | Freq: Every day | ORAL | Status: DC
  Administered 2022-05-15: 10 mg via ORAL
  Filled 2022-05-14: qty 1

## 2022-05-14 MED ORDER — ATORVASTATIN CALCIUM 40 MG PO TABS
40.0000 mg | ORAL_TABLET | Freq: Every day | ORAL | Status: DC
  Filled 2022-05-14: qty 1

## 2022-05-14 MED ORDER — ONDANSETRON HCL 4 MG/2ML IJ SOLN: 4.0000 mg | Freq: Four times a day (QID) | INTRAMUSCULAR | Status: DC | PRN

## 2022-05-14 MED ORDER — SODIUM CHLORIDE 0.9 % IV SOLN: INTRAVENOUS | Status: DC

## 2022-05-14 MED ORDER — LABETALOL HCL 5 MG/ML IV SOLN: 10.0000 mg | INTRAVENOUS | Status: DC | PRN

## 2022-05-14 MED ORDER — MIDAZOLAM HCL 2 MG/2ML IJ SOLN
INTRAMUSCULAR | Status: DC | PRN
  Administered 2022-05-14 (×4): 1 mg via INTRAVENOUS

## 2022-05-14 MED ORDER — SODIUM CHLORIDE 0.9 % IV SOLN: INTRAVENOUS | Status: AC

## 2022-05-14 MED ORDER — OXYCODONE HCL 5 MG PO TABS
5.0000 mg | ORAL_TABLET | ORAL | Status: DC | PRN
  Administered 2022-05-14 – 2022-05-15 (×2): 10 mg via ORAL
  Filled 2022-05-14 (×2): qty 2

## 2022-05-14 MED ORDER — LEVOCETIRIZINE DIHYDROCHLORIDE 5 MG PO TABS: 5.0000 mg | ORAL_TABLET | Freq: Every day | ORAL | Status: DC

## 2022-05-14 MED ORDER — FENTANYL CITRATE (PF) 100 MCG/2ML IJ SOLN
INTRAMUSCULAR | Status: DC | PRN
  Administered 2022-05-14: 25 ug via INTRAVENOUS
  Administered 2022-05-14: 50 ug via INTRAVENOUS
  Administered 2022-05-14: 25 ug via INTRAVENOUS

## 2022-05-14 MED ORDER — HEPARIN SODIUM (PORCINE) 1000 UNIT/ML IJ SOLN
INTRAMUSCULAR | Status: DC | PRN
  Administered 2022-05-14: 3000 [IU] via INTRAVENOUS
  Administered 2022-05-14: 5000 [IU] via INTRAVENOUS

## 2022-05-14 MED ORDER — LIDOCAINE HCL (PF) 1 % IJ SOLN
INTRAMUSCULAR | Status: DC | PRN
  Administered 2022-05-14: 2 mL via SUBCUTANEOUS

## 2022-05-14 MED ORDER — HYDROMORPHONE HCL 1 MG/ML IJ SOLN
0.5000 mg | INTRAMUSCULAR | Status: DC | PRN
  Administered 2022-05-14 – 2022-05-15 (×2): 1 mg via INTRAVENOUS
  Filled 2022-05-14 (×2): qty 1

## 2022-05-14 MED ORDER — MONTELUKAST SODIUM 10 MG PO TABS
10.0000 mg | ORAL_TABLET | Freq: Every day | ORAL | Status: DC
  Administered 2022-05-14 – 2022-05-15 (×2): 10 mg via ORAL
  Filled 2022-05-14 (×2): qty 1

## 2022-05-14 SURGICAL SUPPLY — 20 items
BAG SNAP BAND KOVER 36X36 (MISCELLANEOUS) IMPLANT
BALLN ATLAS 14X40X75 (BALLOONS) ×1
BALLN MUSTANG 10X80X75 (BALLOONS) ×1
BALLN MUSTANG 12X80X75 (BALLOONS) ×1
BALLOON ATLAS 14X40X75 (BALLOONS) IMPLANT
BALLOON MUSTANG 10X80X75 (BALLOONS) IMPLANT
BALLOON MUSTANG 12X80X75 (BALLOONS) IMPLANT
CANISTER PENUMBRA ENGINE (MISCELLANEOUS) IMPLANT
CATH ANGIO 5F BER2 100CM (CATHETERS) IMPLANT
CATH LIGHTNI FLASH 16XTORQ 100 (CATHETERS) IMPLANT
CATH LIGHTNING FLASH XTORQ 100 (CATHETERS) ×1
CATH VISIONS PV .035 IVUS (CATHETERS) IMPLANT
COVER DOME SNAP 22 D (MISCELLANEOUS) IMPLANT
DRYSEAL FLEXSHEATH 16FR 33CM (SHEATH) ×1
GLIDEWIRE ADV .035X260CM (WIRE) IMPLANT
KIT MICROPUNCTURE NIT STIFF (SHEATH) IMPLANT
PROTECTION STATION PRESSURIZED (MISCELLANEOUS) ×1
SHEATH DRYSEAL FLEX 16FR 33CM (SHEATH) IMPLANT
SHEATH PINNACLE 8F 10CM (SHEATH) IMPLANT
STATION PROTECTION PRESSURIZED (MISCELLANEOUS) IMPLANT

## 2022-05-14 NOTE — Progress Notes (Signed)
ANTICOAGULATION CONSULT NOTE - Initial Consult  Pharmacy Consult for heparin Indication: severe PAD with subacute thrombus in LLE stent   Allergies  Allergen Reactions   Coconut (Cocos Nucifera) Anaphylaxis   Ivp Dye [Iodinated Contrast Media] Anaphylaxis and Hives   Latex Anaphylaxis   Tessalon [Benzonatate] Anaphylaxis    Patient Measurements: Height: 5\' 3"  (160 cm) Weight: 49.9 kg (110 lb) IBW/kg (Calculated) : 52.4 Heparin Dosing Weight: 50 kg  Vital Signs: Temp: 98.8 F (37.1 C) (11/13 1123) Temp Source: Oral (11/13 1123) BP: 90/67 (11/13 1634) Pulse Rate: 0 (11/13 1639)  Labs: Recent Labs    05/13/22 1622 05/13/22 1707  HGB 11.4* 11.9*  HCT 35.4* 35.0*  PLT 197  --   CREATININE 0.72 0.80    Estimated Creatinine Clearance: 76.6 mL/min (by C-G formula based on SCr of 0.8 mg/dL).   Medical History: Past Medical History:  Diagnosis Date   Pulmonary embolism ([facility]) 01/2009     Assessment: 36 yo W with hx left iliac vein stenting January 2022 that thrombosed and s/p thrombolysis with restenting in February 2023. Now presenting with at least subacute thrombus in the same LLE stent planning thrombectomy 11/13. On apixaban with no missed doses prior to admission, last dose 11/12 @11am .  Pharmacy consulted for heparin.    Patient was previously on 1000 units/hr with APTT at low end of goal. Will target higher end of goal given clot history and low bleed risk.   Goal of Therapy:  Heparin level 0.3-0.7 units/ml aPTT 85-102 seconds Monitor platelets by anticoagulation protocol: Yes   Plan:  Heparin 1100 units/hr, no bolus F/u 8 hr APTT  F/u aPTT until correlates with heparin level  Monitor daily INR aPTT, heparin level, CBC Monitor for signs/symptoms of bleeding  F/u long term plan, may switch apixaban to other medication per notes   13/12, PharmD, BCPS, BCCP Clinical Pharmacist  Please check AMION for all [facility] phone numbers After 10:00 PM, call  Main Pharmacy [phone]

## 2022-05-14 NOTE — Op Note (Signed)
Patient name: Saleen Peden MRN: [id number] DOB: 08-18-1985 Sex: female  05/14/2022 Pre-operative Diagnosis: Occluded left common and external neck vein stents Post-operative diagnosis:  Same Surgeon:  Apolinar Junes C. Randie Heinz, MD Procedure Performed: 1.  Ultrasound-guided cannulation left popliteal vein 2.  Left pelvic angiography including IVC, left common and external neck veins and common femoral vein 3.  Intravascular ultrasound left femoral vein, left common femoral vein, left external and common iliac veins and IVC 4.  Mechanical thrombectomy with CAT 16 penumbra of left common femoral vein, left external leg vein, left common iliac vein stent 5.  Balloon angioplasty with 10, 12 and 14 mm balloons of left common femoral vein, left external vein and left common iliac vein stents 6.  Moderate sedation with fentanyl and Versed for 59 minutes   Indications: 36 year old female with a 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  She now represents with left groin pain with CT venogram evidence of occlusion of her left common and external iliac vein stents with symptoms of left groin pain for 3 weeks but without left lower extremity swelling.  We discussed the risk and benefits and have agreed to proceed with mechanical thrombectomy of her occluded left common, left external and left common femoral vein stents.  Findings: The common femoral vein stent was patent up into the level of the level of the left femoral head and then the stent was occluded from the external iliac vein all the way up to approximately top of L4 where it was then patent likely where the right common iliac vein did cross which was jailed by the existing stents.  After mechanical thrombectomy with multiple passes we then performed balloon angioplasty with 10, 12 and 14 mm balloons and also used a  bottle brush technique and at completion there was only minimal residual chronic thrombus at the most cephalad aspect of the common iliac vein stent on the left but there was a diameter of 10.5 millimeters at that level and there was brisk flow without any filling of collaterals.  The femoral vein was collapsed did not appear to have any acute or chronic thrombus.   Procedure:  The patient was identified in the holding area and taken to room 8.  The patient was then placed prone on the table and prepped and draped in the usual sterile fashion.  A time out was called.  Ultrasound was used to evaluate the left popliteal vein.  I did search for a small saphenous vein on the left but this appeared to be chronically thrombosed.  The area was anesthetized and I cannulated the popliteal vein with a micropuncture needle followed by wire and a micropuncture sheath.  And images saved the permanent record.  Concomitantly we administered fentanyl and Versed for moderate sedation and her vital signs were monitored throughout the case we also administered a total of 8000 units of heparin.  I then placed a Glidewire advantage and I was able to cross this up to the IVC although it did hang up in multiple areas within the left external and common iliac vein stents.  An 8 French sheath was placed.  We did confirm intraluminal access and then placed a Glidewire advantage into the right subclavian vein and this was marked on the drapes.  Intravascular ultrasound was also performed which demonstrated the chronic appearing thrombus within the  external and common iliac vein starting at the common femoral vein stent.  We then placed a 16 French dry seal sheath and performed mechanical thrombectomy with penumbra CAT 16 with multiple passes including 1 without the wire.  Satisfied that is much clot was removed as possible we then performed venography and then balloon angioplasty with 10, 12 and 14 mm balloons.  Completion intravascular  ultrasound demonstrated patency of the stents with 1 area that had chronic residual thrombus but with the lumen of over 10 mm I elected no further intervention.  Completion venogram was performed which demonstrated no filling of collaterals.  The wire and sheath were removed and a suture bolster was placed with 0 silk suture.  The patient did tolerate procedure without any complication.  EBL: 450 cc   Contrast: 55cc  Keano Guggenheim C. Randie Heinz, MD [facility] [place] Office: [phone] Pager: [phone]

## 2022-05-14 NOTE — Care Plan (Signed)
This CT Tech noticed that the CT venogram had not been ready yet, made phone call to Radiology group and let them know that this was a Stat exam from the ED and that it needed to be read.

## 2022-05-14 NOTE — ED Provider Notes (Signed)
[facility] EMERGENCY DEPARTMENT Provider Note   CSN: [id number] Arrival date & time: 05/13/22  1544     History  No chief complaint on file.   Musa Robitaille is a 36 y.o. female.  36 year old female who presents the ER today with left anterior thigh pain and paresthesias similar to when she had iliac DVTs in the past.  Patient states that in 2022 she first got diagnosed with extensive clot burden in her iliac vein.  Had a stent placed start Eliquis and diagnosed with May Thurner syndrome.  She is found have a protein C S deficiency.  She was religious with her medications but then February started having pain again no swelling that time and found to have in-stent thrombosis.  She came and had what appears to be catheter directed thrombolysis for 24 hours and had it removed and been doing fine since that time.  [place] from [place] and went hiking around the forest.  After about 10 to 15-minute she started having left thigh pain again exactly the same as it was in February when she had a thrombosis and in 2022.  She states kilobit better with rest of every time she goes to walk it hurts per significantly.  She has noticed any swelling.  Has no pain distally.  No chest pain or shortness of breath.  She remains religiously compliant with her Eliquis.  No trauma.  No infectious symptoms.        Home Medications Prior to Admission medications   Medication Sig Start Date End Date Taking? Authorizing Provider  acetaminophen (TYLENOL) 325 MG tablet Take 2 tablets (650 mg total) by mouth every 4 (four) hours as needed for headache or mild pain. 08/19/21   Lonia Blood, MD  apixaban (ELIQUIS) 5 MG TABS tablet Take 1 tablet (5 mg total) by mouth 2 (two) times daily. 08/30/21   Loa Socks, NP  atomoxetine (STRATTERA) 40 MG capsule Take 40 mg by mouth every morning. 08/08/21   [provider]  atorvastatin (LIPITOR) 40 MG tablet Take 1  tablet (40 mg total) by mouth daily. 08/20/21   Lonia Blood, MD  busPIRone (BUSPAR) 15 MG tablet Take 15 mg by mouth daily. 08/14/21   [provider]  cetirizine (ZYRTEC ALLERGY) 10 MG tablet Take 1 tablet (10 mg total) by mouth daily. 07/09/14   Harris, Abigail, PA-C  escitalopram (LEXAPRO) 20 MG tablet Take 10 mg by mouth daily.    [provider]  traZODone (DESYREL) 100 MG tablet Take 100 mg by mouth at bedtime. 08/08/21   [provider]      Allergies    Ivp dye [iodinated contrast media], Tessalon [benzonatate], and Latex    Review of Systems   Review of Systems  Physical Exam Updated Vital Signs BP 94/61 (BP Location: Left Arm)   Pulse (!) 57   Temp 98.2 F (36.8 C) (Oral)   Resp 12   SpO2 96%  Physical Exam Vitals and nursing note reviewed.  Constitutional:      Appearance: She is well-developed.  HENT:     Head: Normocephalic and atraumatic.     Mouth/Throat:     Mouth: Mucous membranes are moist.  Eyes:     Pupils: Pupils are equal, round, and reactive to light.  Cardiovascular:     Rate and Rhythm: Normal rate and regular rhythm.  Pulmonary:     Effort: No respiratory distress.     Breath sounds:  No stridor.  Abdominal:     General: Abdomen is flat. There is no distension.  Musculoskeletal:     Cervical back: Normal range of motion.  Skin:    General: Skin is warm and dry.  Neurological:     General: No focal deficit present.     Mental Status: She is alert.     ED Results / Procedures / Treatments   Labs (all labs ordered are listed, but only abnormal results are displayed) Labs Reviewed  CBC WITH DIFFERENTIAL/PLATELET - Abnormal; Notable for the following components:      Result Value   Hemoglobin 11.4 (*)    HCT 35.4 (*)    All other components within normal limits  BASIC METABOLIC PANEL - Abnormal; Notable for the following components:   CO2 21 (*)    Glucose, Bld 112 (*)    All other components within normal  limits  I-STAT CHEM 8, ED - Abnormal; Notable for the following components:   Glucose, Bld 110 (*)    Calcium, Ion 1.09 (*)    TCO2 20 (*)    Hemoglobin 11.9 (*)    HCT 35.0 (*)    All other components within normal limits  I-STAT BETA HCG BLOOD, ED ([facility], [facility], [facility] ONLY)    EKG None  Radiology No results found.  Procedures Procedures    Medications Ordered in ED Medications  methylPREDNISolone sodium succinate (SOLU-MEDROL) 125 mg/2 mL injection 40 mg (40 mg Intravenous Given 05/13/22 1654)  diphenhydrAMINE (BENADRYL) capsule 50 mg ( Oral See Alternative 05/13/22 2013)    Or  diphenhydrAMINE (BENADRYL) injection 50 mg (50 mg Intravenous Given 05/13/22 2013)  iohexol (OMNIPAQUE) 350 MG/ML injection 125 mL (125 mLs Intravenous Contrast Given 05/13/22 2114)    ED Course/ Medical Decision Making/ A&P                           Medical Decision Making Risk Decision regarding hospitalization.  Concern for rethrombosis of her stent venogram was done.  I discussed with the radiology office and there is no one in the office second read at ~0 800.  On my interpretation of the CT scan the blood flow through there does look different than it did on the right.  I don't feel comfortable interpreting as no large clot so we will discuss with vascular surgery and see if they feel comfortable looking at the CT scan or not if not the patient may need to wait till 8:00 for radiologist.. Discussed with Dr. Randie Heinz, will review images and evaluate patient for management. Care to Dr. Rhunette Croft pending vascular consult and final read.    Final Clinical Impression(s) / ED Diagnoses Final diagnoses:  None    Rx / DC Orders ED Discharge Orders     None         Alany Borman, Barbara Cower, MD 05/15/22 [phone]
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
## 2022-05-14 NOTE — ED Provider Notes (Signed)
  Physical Exam  BP 95/65 (BP Location: Right Arm)   Pulse 64   Temp 98.8 F (37.1 C) (Oral)   Resp 18   Ht 5\' 3"  (1.6 m)   Wt 49.9 kg   SpO2 98%   BMI 19.49 kg/m   Physical Exam  Procedures  .Critical Care  Performed by: , MD Authorized by: Derwood Kaplan, MD   Critical care provider statement:    Critical care time (minutes):  30   Critical care was necessary to treat or prevent imminent or life-threatening deterioration of the following conditions:  Circulatory failure   Critical care was time spent personally by me on the following activities:  Development of treatment plan with patient or surrogate, discussions with consultants, evaluation of patient's response to treatment, examination of patient, ordering and review of laboratory studies, ordering and review of radiographic studies, ordering and performing treatments and interventions, pulse oximetry, re-evaluation of patient's condition, review of old charts and obtaining history from patient or surrogate   ED Course / MDM    Medical Decision Making Risk Decision regarding hospitalization.    Patient comes to the ER with chief complaint of left lower extremity tingling sensation.  She has history of DVT status post stent. She recently had return from Derwood Kaplan.  Patient is on Eliquis.  CT scan was ordered to evaluate the stent and see if there is any stent complication.  Other possibility considered includes DVT.  CT scan results discussed with vascular surgery and also with radiology service.  It appears that there is restenosis of the stent.  Results discussed with the patient by vascular surgery.  They will admit the patient.      [place], MD 05/14/22 1308

## 2022-05-15 ENCOUNTER — Encounter (HOSPITAL_COMMUNITY): Payer: Self-pay | Admitting: Vascular Surgery

## 2022-05-15 DIAGNOSIS — Z86711 Personal history of pulmonary embolism: Secondary | ICD-10-CM | POA: Diagnosis not present

## 2022-05-15 DIAGNOSIS — Z91041 Radiographic dye allergy status: Secondary | ICD-10-CM | POA: Diagnosis not present

## 2022-05-15 DIAGNOSIS — R69 Illness, unspecified: Secondary | ICD-10-CM | POA: Diagnosis not present

## 2022-05-15 DIAGNOSIS — F17210 Nicotine dependence, cigarettes, uncomplicated: Secondary | ICD-10-CM | POA: Diagnosis not present

## 2022-05-15 DIAGNOSIS — T82868A Thrombosis of vascular prosthetic devices, implants and grafts, initial encounter: Secondary | ICD-10-CM | POA: Diagnosis not present

## 2022-05-15 DIAGNOSIS — Z91040 Latex allergy status: Secondary | ICD-10-CM | POA: Diagnosis not present

## 2022-05-15 DIAGNOSIS — Z888 Allergy status to other drugs, medicaments and biological substances status: Secondary | ICD-10-CM | POA: Diagnosis not present

## 2022-05-15 DIAGNOSIS — I871 Compression of vein: Secondary | ICD-10-CM | POA: Diagnosis not present

## 2022-05-15 DIAGNOSIS — Y831 Surgical operation with implant of artificial internal device as the cause of abnormal reaction of the patient, or of later complication, without mention of misadventure at the time of the procedure: Secondary | ICD-10-CM | POA: Diagnosis not present

## 2022-05-15 DIAGNOSIS — T82856A Stenosis of peripheral vascular stent, initial encounter: Secondary | ICD-10-CM | POA: Diagnosis not present

## 2022-05-15 DIAGNOSIS — D6859 Other primary thrombophilia: Secondary | ICD-10-CM | POA: Diagnosis not present

## 2022-05-15 DIAGNOSIS — D500 Iron deficiency anemia secondary to blood loss (chronic): Secondary | ICD-10-CM | POA: Diagnosis not present

## 2022-05-15 DIAGNOSIS — O2230 Deep phlebothrombosis in pregnancy, unspecified trimester: Secondary | ICD-10-CM | POA: Diagnosis present

## 2022-05-15 DIAGNOSIS — Z91018 Allergy to other foods: Secondary | ICD-10-CM | POA: Diagnosis not present

## 2022-05-15 LAB — CBC
HCT: 23.1 % — ABNORMAL LOW (ref 36.0–46.0)
HCT: 21.5 % — ABNORMAL LOW (ref 36.0–46.0)
Hemoglobin: 7.4 g/dL — ABNORMAL LOW (ref 12.0–15.0)
Hemoglobin: 7 g/dL — ABNORMAL LOW (ref 12.0–15.0)
MCH: 29.6 pg (ref 26.0–34.0)
MCH: 29.8 pg (ref 26.0–34.0)
MCHC: 32 g/dL (ref 30.0–36.0)
MCHC: 32.6 g/dL (ref 30.0–36.0)
MCV: 92.4 fL (ref 80.0–100.0)
MCV: 91.5 fL (ref 80.0–100.0)
Platelets: 154 10*3/uL (ref 150–400)
Platelets: 152 10*3/uL (ref 150–400)
RBC: 2.5 MIL/uL — ABNORMAL LOW (ref 3.87–5.11)
RBC: 2.35 MIL/uL — ABNORMAL LOW (ref 3.87–5.11)
RDW: 15.4 % (ref 11.5–15.5)
RDW: 15.3 % (ref 11.5–15.5)
WBC: 12.1 10*3/uL — ABNORMAL HIGH (ref 4.0–10.5)
WBC: 16 10*3/uL — ABNORMAL HIGH (ref 4.0–10.5)
nRBC: 0 % (ref 0.0–0.2)
nRBC: 0 % (ref 0.0–0.2)

## 2022-05-15 LAB — COMPREHENSIVE METABOLIC PANEL
ALT: 8 U/L (ref 0–44)
AST: 15 U/L (ref 15–41)
Albumin: 2.9 g/dL — ABNORMAL LOW (ref 3.5–5.0)
Alkaline Phosphatase: 42 U/L (ref 38–126)
Anion gap: 8 (ref 5–15)
BUN: 12 mg/dL (ref 6–20)
CO2: 21 mmol/L — ABNORMAL LOW (ref 22–32)
Calcium: 7.9 mg/dL — ABNORMAL LOW (ref 8.9–10.3)
Chloride: 107 mmol/L (ref 98–111)
Creatinine, Ser: 1.04 mg/dL — ABNORMAL HIGH (ref 0.44–1.00)
GFR, Estimated: >60 mL/min (ref >60)
Glucose, Bld: 175 mg/dL — ABNORMAL HIGH (ref 70–99)
Potassium: 4.6 mmol/L (ref 3.5–5.1)
Sodium: 136 mmol/L (ref 135–145)
Total Bilirubin: 0.4 mg/dL (ref 0.3–1.2)
Total Protein: 5.3 g/dL — ABNORMAL LOW (ref 6.5–8.1)

## 2022-05-15 LAB — LIPID PANEL
Cholesterol: 106 mg/dL (ref 0–200)
HDL: 35 mg/dL — ABNORMAL LOW (ref >40)
LDL Cholesterol: 66 mg/dL (ref 0–99)
Total CHOL/HDL Ratio: 3 RATIO
Triglycerides: 23 mg/dL (ref <150)
VLDL: 5 mg/dL (ref 0–40)

## 2022-05-15 LAB — APTT
aPTT: 175 seconds (ref 24–36)
aPTT: 113 seconds — ABNORMAL HIGH (ref 24–36)

## 2022-05-15 MED ORDER — ROSUVASTATIN CALCIUM 20 MG PO TABS: 20.0000 mg | ORAL_TABLET | Freq: Every day | ORAL | 3 refills | Status: DC

## 2022-05-15 MED ORDER — ROSUVASTATIN CALCIUM 20 MG PO TABS: 20.0000 mg | ORAL_TABLET | Freq: Every day | ORAL | Status: DC

## 2022-05-15 MED ORDER — OXYCODONE HCL 5 MG PO TABS: 5.0000 mg | ORAL_TABLET | ORAL | 0 refills | Status: DC | PRN

## 2022-05-15 MED ORDER — ASPIRIN 81 MG PO TBEC: 81.0000 mg | DELAYED_RELEASE_TABLET | Freq: Every day | ORAL | 12 refills | Status: DC

## 2022-05-15 NOTE — Progress Notes (Addendum)
Progress Note    05/15/2022 8:17 AM 1 Day Post-Op  Subjective:  feeling sore in her left leg    Vitals:   05/15/22 0344 05/15/22 0755  BP: 98/61 (!) 91/57  Pulse: 73 71  Resp: 20 20  Temp: 98.1 F (36.7 C) 98.4 F (36.9 C)  SpO2: 100% 100%    Physical Exam: Lungs:  nonlabored Incisions:  L popliteal incision c/d/l. Bolster removed with minimal bleeding. Redressed with gauze and tegaderm Extremities:  Palpable DP pulses  CBC    Component Value Date/Time   WBC 12.1 (H) 05/15/2022 0058   RBC 2.50 (L) 05/15/2022 0058   HGB 7.4 (L) 05/15/2022 0058   HGB 12.5 08/26/2009 1213   HCT 23.1 (L) 05/15/2022 0058   HCT 38.7 08/26/2009 1213   PLT 154 05/15/2022 0058   PLT 184 08/26/2009 1213   MCV 92.4 05/15/2022 0058   MCV 88.8 08/26/2009 1213   MCH 29.6 05/15/2022 0058   MCHC 32.0 05/15/2022 0058   RDW 15.4 05/15/2022 0058   RDW 14.5 08/26/2009 1213   LYMPHSABS 1.8 05/13/2022 1622   LYMPHSABS 1.5 08/26/2009 1213   MONOABS 0.4 05/13/2022 1622   MONOABS 0.3 08/26/2009 1213   EOSABS 0.1 05/13/2022 1622   EOSABS 0.2 08/26/2009 1213   BASOSABS 0.0 05/13/2022 1622   BASOSABS 0.0 08/26/2009 1213    BMET    Component Value Date/Time   NA 136 05/15/2022 0058   K 4.6 05/15/2022 0058   CL 107 05/15/2022 0058   CO2 21 (L) 05/15/2022 0058   GLUCOSE 175 (H) 05/15/2022 0058   BUN 12 05/15/2022 0058   CREATININE 1.04 (H) 05/15/2022 0058   CALCIUM 7.9 (L) 05/15/2022 0058   GFRNONAA >60 05/15/2022 0058   GFRAA  02/07/2009 2314    >60        The eGFR has been calculated using the MDRD equation. This calculation has not been validated in all clinical situations. eGFR's persistently <60 mL/min signify possible Chronic Kidney Disease.    INR    Component Value Date/Time   INR 0.9 06/29/2021 1925   INR 1.00 (L) 08/26/2009 1213   INR 1.6 05/17/2009 1452     Intake/Output Summary (Last 24 hours) at 05/15/2022 0817 Last data filed at 05/14/2022 2018 Gross per 24  hour  Intake 2423.89 ml  Output --  Net 2423.89 ml      Assessment/Plan:  36 y.o. female is 1 day post op, s/p: LLE and pelvic angiography with mechanical thrombectomy of L CFV, EIV, and CIV  -Patient is feeling sore at cath site this morning. Bolster was removed with minimal bleeding and redressed with gauze and tegaderm -Palpable DP pulses -Still on heparin. Will transition to Eliquis at discharge -Hgb dropped to 7.4 from 11.9. Patient is asymptomatic. Will order repeat CBC this AM -D/C today pending stable repeat hgb. Will resume Aspirin and Eliquis at discharge and follow up with hematology outpatient  Vicente Serene, PA-C [facility] [phone] 05/15/2022 8:17 AM   -Patient will be switched from atorvastatin to rosuvastatin due to dizziness issues. -Repeat Hgb at 7.0 this morning and patient is asymptomatic. Expected anemia due to some blood loss during procedure yesterday -She is stable for d/c  -Dr.Luxe Cuadros will see her prior to discharge today. -She will follow up in 1 month with repeat IVC/Iliac vein and LLE DVT study    I have independently interviewed and examined patient and agree with PA assessment and plan above.  I had further  discussion with the patient and okay if she is not on statin.  She does need urgent smoking cessation.  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  She will follow-up in our office with IVC iliac duplex in approximately 1 month and she will continue Eliquis until that time.  Amiria Orrison C. Donzetta Matters, MD [facility] Office: [phone] Pager: [phone]

## 2022-05-15 NOTE — Plan of Care (Signed)

## 2022-05-15 NOTE — Progress Notes (Signed)
Explained discharge instructions to patient. Reviewed follow up appointment and next medication administration times. Also reviewed education. Patient verbalized having an understanding for instructions given. Redressed patient's incision. All belongings are in the patient's possession. IV and telemetry were removed. CCMD was notified. No other needs verbalized. Transported downstairs for discharge.

## 2022-05-15 NOTE — Progress Notes (Signed)
ANTICOAGULATION CONSULT NOTE   Pharmacy Consult for heparin Indication: severe PAD with subacute thrombus in LLE stent, history of PE  Allergies  Allergen Reactions   Coconut (Cocos Nucifera) Anaphylaxis   Ivp Dye [Iodinated Contrast Media] Anaphylaxis and Hives   Latex Anaphylaxis   Tessalon [Benzonatate] Anaphylaxis    Patient Measurements: Height: 5\' 3"  (160 cm) Weight: 49.9 kg (110 lb) IBW/kg (Calculated) : 52.4 Heparin Dosing Weight: 50 kg  Vital Signs: Temp: 98.4 F (36.9 C) (11/14 0755) Temp Source: Oral (11/14 0755) BP: 91/57 (11/14 0755) Pulse Rate: 71 (11/14 0755)  Labs: Recent Labs    05/13/22 1622 05/13/22 1707 05/15/22 0058  HGB 11.4* 11.9* 7.4*  HCT 35.4* 35.0* 23.1*  PLT 197  --  154  APTT  --   --  175*  CREATININE 0.72 0.80 1.04*     Estimated Creatinine Clearance: 58.9 mL/min (A) (by C-G formula based on SCr of 1.04 mg/dL (H)).   Medical History: Past Medical History:  Diagnosis Date   Pulmonary embolism ([facility]) 01/2009     Assessment: 36 yo W with hx left iliac vein stenting January 2022 that thrombosed and s/p thrombolysis with restenting in February 2023. Now presenting with at least subacute thrombus in the same LLE stent planning thrombectomy 11/13. On apixaban with no missed doses prior to admission, last dose 11/12 @11am .  Pharmacy consulted for heparin.    Aptt this morning elevated at 113s, no heparin level given prior apixaban usage. Hgb 7.4 and down to 7.0 on repeat. Discussed with vascular team, they are aware and noted she did have significant blood loss during OR yesterday and patient is currently asymptomatic. Will decrease heparin rate and continue to follow closely.   Goal of Therapy:  Heparin level 0.3-0.7 units/ml aPTT 85-102 seconds Monitor platelets by anticoagulation protocol: Yes   Plan:  Heparin 800 units/hr F/u 8 hr APTT and cbc if patient is not discharged F/u aPTT until correlates with heparin level   13/12 PharmD., BCPS Clinical Pharmacist 05/15/2022 8:50 AM

## 2022-05-15 NOTE — Progress Notes (Signed)
PHARMACIST LIPID MONITORING   Amber Carroll is a 36 y.o. female admitted on 05/13/2022 with leg/groin pain. Patient found to have occluded left common and external neck vein stents. Pharmacy has been consulted to optimize lipid-lowering therapy with the indication of secondary prevention for clinical ASCVD.  Recent Labs:  Lipid Panel (last 6 months):   Lab Results  Component Value Date   CHOL 106 05/15/2022   TRIG 23 05/15/2022   HDL 35 (L) 05/15/2022   CHOLHDL 3.0 05/15/2022   VLDL 5 05/15/2022   LDLCALC 66 05/15/2022    Hepatic function panel (last 6 months):   Lab Results  Component Value Date   AST 15 05/15/2022   ALT 8 05/15/2022   ALKPHOS 42 05/15/2022   BILITOT 0.4 05/15/2022    SCr (since admission):   Serum creatinine: 1.04 mg/dL (H) 32/99/24 2683 Estimated creatinine clearance: 58.9 mL/min (A)  Current therapy and lipid therapy tolerance Current lipid-lowering therapy: none Previous lipid-lowering therapies (if applicable): atorvastatin Documented or reported allergies or intolerances to lipid-lowering therapies (if applicable): nothing documented, but there was discussion of patient having dizziness that she somewhat attributed to possibly being atorvastatin.   Assessment:   Patient prefers to speak with the vascular team as she has not been told she needs to be on cholesterol lowering agent prior to admit. We discussed if the plan was to move forward with a statin, we could change the agent to rosuvastatin to avoid the issue with dizziness.   Plan:    1.Statin intensity (high intensity recommended for all patients regardless of the LDL):  Add or increase statin to high intensity, pending discussion with vascular team.   2.Add ezetimibe (if any one of the following):   Not indicated at this time.  3.Refer to lipid clinic:   No  4.Follow-up with:  Primary care provider - Toppin, Amber [place], MD  5.Follow-up labs after discharge:  Changes in lipid therapy were  made. Check a lipid panel in 8-12 weeks then annually.      Sheppard Coil PharmD., BCPS Clinical Pharmacist 05/15/2022 9:04 AM

## 2022-05-15 NOTE — Progress Notes (Signed)
ANTICOAGULATION CONSULT NOTE - Follow Up Consult  Pharmacy Consult for heparin Indication:  severe PAD with subacute thrombus in LLE stent   Labs: Recent Labs    05/13/22 1622 05/13/22 1707 05/15/22 0058  HGB 11.4* 11.9* 7.4*  HCT 35.4* 35.0* 23.1*  PLT 197  --  154  APTT  --   --  175*  CREATININE 0.72 0.80 1.04*    Assessment: 36yo female suprtherapeutic on heparin with initial dosing while Eliquis on hold; no infusion issues or signs of bleeding per RN.  Goal of Therapy:  aPTT 66-102 seconds   Plan:  Will decrease heparin infusion by 4 units/kg/hr to 900 units/hr and check level in 6 hours.    Vernard Gambles, PharmD, BCPS  05/15/2022,2:57 AM

## 2022-05-19 NOTE — Discharge Summary (Signed)
Discharge Summary  Patient ID: Amber Carroll [id number] 36 y.o. [birth year]  Admit date: 05/13/2022  Discharge date and time: 05/15/2022  2:09 PM   Admitting Physician: Amber Harman, MD   Discharge Physician: Amber Harman, MD  Admission Diagnoses: Thrombosis of arterial stent, initial encounter (HCC) [O13.086V] DVT (deep venous thrombosis) (HCC) [I82.409] DVT (deep vein thrombosis) in pregnancy [O22.30]  Discharge Diagnoses: Thrombosis of arterial stent, initial encounter (HCC) [H84.696E] DVT (deep venous thrombosis) (HCC) [I82.409] DVT (deep vein thrombosis) in pregnancy [O22.30]  Admission Condition: fair  Discharged Condition: good  Indication for Admission: 36 year old female with a 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  She now represents with left groin pain with CT venogram evidence of occlusion of her left common and external iliac vein stents with symptoms of left groin pain for 3 weeks but without left lower extremity swelling.  We discussed the risk and benefits and have agreed to proceed with mechanical thrombectomy of her occluded left common, left external and left common femoral vein stents.   Hospital Course: The patient was admitted to the hospital on 05/13/2022 with repeat occlusion of left common and external iliac vein stents. She was started on IV heparin at admission and her home Eliquis was held. She underwent left pelvic and IVC angiography with mechanical thrombectomy and balloon angioplasty of left common femoral vein, left external iliac vein, and left common iliac vein on 05/14/2022. She tolerated the procedure well and was transferred to the PACU in stable condition.   POD1 the patient was ambulating well and tolerating a normal diet. L groin pain had lessened since thrombectomy. She continued on  IV heparin. Left popliteal bolster was removed with minimal bleeding. Hgb had dropped to 7.0 post op, however the patient was asymptomatic. Hgb drop was to be expected from mild blood loss during procedure.   The patient was discharged home on POD1. IV heparin was discontinued at discharge and the patient was to resume home Eliquis. The patient was to follow up with hematology outpatient to discuss anticoagulant therapy.  Consults: None  Treatments: IV hydration, analgesia: oxycodone, anticoagulation: ASA and heparin  Discharge Exam: See progress note  Vitals:   05/15/22 0755 05/15/22 1127  BP: (!) 91/57 (!) 95/54  Pulse: 71 79  Resp: 20 20  Temp: 98.4 F (36.9 C) 98.3 F (36.8 C)  SpO2: 100% 100%     Disposition: Discharge disposition: 01-Home or Self Care       Patient Instructions:  Allergies as of 05/15/2022       Reactions   Coconut (cocos Nucifera) Anaphylaxis   Ivp Dye [iodinated Contrast Media] Anaphylaxis, Hives   Latex Anaphylaxis   Tessalon [benzonatate] Anaphylaxis        Medication List     STOP taking these medications    atorvastatin 40 MG tablet Commonly known as: LIPITOR       TAKE these medications    acetaminophen 325 MG tablet Commonly known as: TYLENOL Take 2 tablets (650 mg total) by mouth every 4 (four) hours as needed for headache or mild pain.   albuterol 108 (90 Base) MCG/ACT inhaler Commonly known as: VENTOLIN HFA Inhale 2 puffs into the lungs every 6 (six) hours as needed for wheezing or shortness of breath.   ALPRAZolam 0.5 MG tablet Commonly known as: XANAX Take 0.5 mg by mouth 2 (  two) times daily as needed for anxiety or sleep.   apixaban 5 MG Tabs tablet Commonly known as: ELIQUIS Take 1 tablet (5 mg total) by mouth 2 (two) times daily.   aspirin EC 81 MG tablet Take 1 tablet (81 mg total) by mouth daily. Swallow whole.   cetirizine 10 MG tablet Commonly known as: ZyrTEC Allergy Take 1 tablet (10 mg total) by  mouth daily.   levocetirizine 5 MG tablet Commonly known as: XYZAL Take 5 mg by mouth daily.   montelukast 10 MG tablet Commonly known as: SINGULAIR Take 10 mg by mouth daily.   oxyCODONE 5 MG immediate release tablet Commonly known as: Oxy IR/ROXICODONE Take 1 tablet (5 mg total) by mouth every 4 (four) hours as needed for moderate pain.               Discharge Care Instructions  (From admission, onward)           Start     Ordered   05/15/22 0000  Discharge wound care:       Comments: Can remove dressing behind left knee on 11/15. Keep incision site clean with soap and water daily. Can keep covered with bandaid or gauze until fully healed   05/15/22 1021           Activity: activity as tolerated Diet: regular diet Wound Care: keep wound clean and dry  Follow-up with [facility] in 1 month.  SignedErnestene Mention, PA-C 05/19/2022 9:04 PM [facility] Office: [phone]

## 2022-05-29 ENCOUNTER — Telehealth: Payer: Self-pay | Admitting: Hematology and Oncology

## 2022-05-29 NOTE — Telephone Encounter (Signed)
Contacted patient to scheduled appointments. Patient is aware of appointments that are scheduled.   

## 2022-05-31 NOTE — Progress Notes (Signed)
Patient Care Team: Toppin, [name], MD as PCP - General (Internal Medicine) [name], MD as Consulting Physician (Hematology and Oncology) Leonie Douglas, MD as Consulting Physician (Vascular Surgery)  DIAGNOSIS: No diagnosis found.  SUMMARY OF ONCOLOGIC HISTORY: Oncology History   No history exists.    CHIEF COMPLIANT: Urgent heme follow-up    INTERVAL HISTORY: Amber Carroll is a 36 y.o. female is here because of recent diagnosis of venous thrombosis blood clotting disorder. She presents to the clinic today for a follow-up. She states that her leg is alright ,the last couple days it has been sore. Her main concern is what do she do from now because it is affecting her ability to work.   ALLERGIES:  is allergic to coconut (cocos nucifera), ivp dye [iodinated contrast media], latex, and tessalon [benzonatate].  MEDICATIONS:  Current Outpatient Medications  Medication Sig Dispense Refill   acetaminophen (TYLENOL) 325 MG tablet Take 2 tablets (650 mg total) by mouth every 4 (four) hours as needed for headache or mild pain.     albuterol (VENTOLIN HFA) 108 (90 Base) MCG/ACT inhaler Inhale 2 puffs into the lungs every 6 (six) hours as needed for wheezing or shortness of breath.     ALPRAZolam (XANAX) 0.5 MG tablet Take 0.5 mg by mouth 2 (two) times daily as needed for anxiety or sleep.     apixaban (ELIQUIS) 5 MG TABS tablet Take 1 tablet (5 mg total) by mouth 2 (two) times daily. 60 tablet 5   aspirin EC 81 MG tablet Take 1 tablet (81 mg total) by mouth daily. Swallow whole. 30 tablet 12   cetirizine (ZYRTEC ALLERGY) 10 MG tablet Take 1 tablet (10 mg total) by mouth daily. (Patient not taking: Reported on 05/14/2022) 30 tablet 1   levocetirizine (XYZAL) 5 MG tablet Take 5 mg by mouth daily.     montelukast (SINGULAIR) 10 MG tablet Take 10 mg by mouth daily.     oxyCODONE (OXY IR/ROXICODONE) 5 MG immediate release tablet Take 1 tablet (5 mg total) by mouth every 4 (four) hours  as needed for moderate pain. 20 tablet 0   No current facility-administered medications for this visit.    PHYSICAL EXAMINATION: ECOG PERFORMANCE STATUS: {CHL ONC ECOG PS:[id number]}  There were no vitals filed for this visit. There were no vitals filed for this visit.  BREAST:*** No palpable masses or nodules in either right or left breasts. No palpable axillary supraclavicular or infraclavicular adenopathy no breast tenderness or nipple discharge. (exam performed in the presence of a chaperone)  LABORATORY DATA:  I have reviewed the data as listed    Latest Ref Rng & Units 05/15/2022   12:58 AM 05/13/2022    5:07 PM 05/13/2022    4:22 PM  CMP  Glucose 70 - 99 mg/dL 865  784  696   BUN 6 - 20 mg/dL 12  6  6    Creatinine 0.44 - 1.00 mg/dL  2.95  2.84   Sodium 135 - 145 mmol/L 136  139  137   Potassium 3.5 - 5.1 mmol/L 4.6  3.7  3.7   Chloride 98 - 111 mmol/L 107  107  108   CO2 22 - 32 mmol/L 21   21   Calcium 8.9 - 10.3 mg/dL 7.9   9.0   Total Protein 6.5 - 8.1 g/dL 5.3     Total Bilirubin 0.3 - 1.2 mg/dL 0.4     Alkaline Phos 38 - 126 U/L 42  AST 15 - 41 U/L 15     ALT 0 - 44 U/L 8       Lab Results  Component Value Date   WBC 16.0 (H) 05/15/2022   HGB 7.0 (L) 05/15/2022   HCT 21.5 (L) 05/15/2022   MCV 91.5 05/15/2022   PLT 152 05/15/2022   NEUTROABS 5.3 05/13/2022    ASSESSMENT & PLAN:  No problem-specific Assessment & Plan notes found for this encounter.    No orders of the defined types were placed in this encounter.  The patient has a good understanding of the overall plan. she agrees with it. she will call with any problems that may develop before the next visit here. Total time spent: 30 mins including face to face time and time spent for planning, charting and co-ordination of care   Sherlyn Lick, CMA 05/31/22    I Janan Ridge am scribing for Dr. Pamelia Hoit  ***

## 2022-06-01 ENCOUNTER — Inpatient Hospital Stay: Payer: 59 | Attending: Hematology and Oncology | Admitting: Hematology and Oncology

## 2022-06-01 ENCOUNTER — Other Ambulatory Visit: Payer: Self-pay

## 2022-06-01 VITALS — BP 112/57 | HR 92 | Temp 97.7°F | Resp 18 | Ht 63.0 in | Wt 113.6 lb

## 2022-06-01 DIAGNOSIS — Z86711 Personal history of pulmonary embolism: Secondary | ICD-10-CM | POA: Diagnosis not present

## 2022-06-01 DIAGNOSIS — D6859 Other primary thrombophilia: Secondary | ICD-10-CM | POA: Diagnosis not present

## 2022-06-01 DIAGNOSIS — Z7901 Long term (current) use of anticoagulants: Secondary | ICD-10-CM | POA: Diagnosis not present

## 2022-06-01 DIAGNOSIS — I82409 Acute embolism and thrombosis of unspecified deep veins of unspecified lower extremity: Secondary | ICD-10-CM

## 2022-06-01 DIAGNOSIS — Z86718 Personal history of other venous thrombosis and embolism: Secondary | ICD-10-CM | POA: Diagnosis not present

## 2022-06-01 NOTE — Assessment & Plan Note (Signed)
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

## 2022-06-07 ENCOUNTER — Other Ambulatory Visit: Payer: Self-pay | Admitting: *Deleted

## 2022-06-07 DIAGNOSIS — I82422 Acute embolism and thrombosis of left iliac vein: Secondary | ICD-10-CM

## 2022-06-07 DIAGNOSIS — I871 Compression of vein: Secondary | ICD-10-CM

## 2022-06-12 NOTE — Progress Notes (Unsigned)
POST OPERATIVE OFFICE NOTE    CC:  F/u for surgery  HPI:  This is a 36 y.o. female who is s/p Left pelvic angiography including IVC, left common and external iliac veins and common femoral vein Intravascular ultrasound left femoral vein, left common femoral vein, left external and common iliac veins and IVC; Mechanical thrombectomy with CAT 16 penumbra of left common femoral vein, left external leg vein, left common iliac vein stent and Balloon angioplasty with 10, 12 and 14 mm balloons of left common femoral vein, left external vein and left common iliac vein stents on 05/14/22 by Dr. Donzetta Matters.  This was performed secondary to occluded left common and external iliac vein stents. 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 She did well post operatively and was discharged  home POD#1 on Eliquis. She was to follow up with Hematology as outpatient for management of her Anticoagulation.  Pt returns today for follow up.  Pt states that her legs are feeling fine. She is not having any swelling or pain. She has been seeing Dr. Lindi Adie with Oncology/ hematology for anticoagulant management. She expresses some frustration and concerns about her recurrent DVTs despite being compliant with her Eliquis. She is also taking Aspirin 81 mg daily was well.    Allergies  Allergen Reactions   Coconut (Cocos Nucifera) Anaphylaxis   Ivp Dye [Iodinated Contrast Media] Anaphylaxis and Hives   Latex Anaphylaxis   Tessalon [Benzonatate] Anaphylaxis    Current Outpatient Medications  Medication Sig Dispense Refill   acetaminophen (TYLENOL) 325 MG tablet Take 2 tablets  (650 mg total) by mouth every 4 (four) hours as needed for headache or mild pain.     albuterol (VENTOLIN HFA) 108 (90 Base) MCG/ACT inhaler Inhale 2 puffs into the lungs every 6 (six) hours as needed for wheezing or shortness of breath.     ALPRAZolam (XANAX) 0.5 MG tablet Take 0.5 mg by mouth 2 (two) times daily as needed for anxiety or sleep.     apixaban (ELIQUIS) 5 MG TABS tablet Take 1 tablet (5 mg total) by mouth 2 (two) times daily. 60 tablet 5   aspirin EC 81 MG tablet Take 1 tablet (81 mg total) by mouth daily. Swallow whole. 30 tablet 12   cetirizine (ZYRTEC ALLERGY) 10 MG tablet Take 1 tablet (10 mg total) by mouth daily. 30 tablet 1   levocetirizine (XYZAL) 5 MG tablet Take 5 mg by mouth daily.     montelukast (SINGULAIR) 10 MG tablet Take 10 mg by mouth daily.     No current facility-administered medications for this visit.     ROS:  See HPI  Physical Exam:  Vitals:   06/13/22 1116  BP: 103/70  Pulse: 75  Temp: 98.6 F (37 C)  SpO2: 100%    General: well appearing, well nourished, not in any distress Cardiac: regular rate and rhythm Lungs: non labored Extremities:  well perfused and warm with 2+ femoral, popliteal Dp and PT pulses bilaterally. No edema Neuro: alert and oriented  Assessment/Plan:  This is a 36 y.o. female who is s/p:Left pelvic angiography including IVC, left common and external iliac  veins and common femoral vein Intravascular ultrasound left femoral vein, left common femoral vein, left external and common iliac veins and IVC; Mechanical thrombectomy with CAT 16 penumbra of left common femoral vein, left external leg vein, left common iliac vein stent and Balloon angioplasty with 10, 12 and 14 mm balloons of left common femoral vein, left external vein and left common iliac vein stents on 05/14/22 by Dr. Randie Heinz. This was performed secondary to occluded left common and external iliac vein stents. 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 - She is maintained on Eliquis and Aspirin 81 mg daily. She will need anticoagulation lifelong - Encouraged her to resume normal activities. No restrictions -Will make referral to Oncology/ hematology for a second opinion for anticoagulant management - she knows if she develops any new swelling or pain to go to ER - she will return for follow up with [place] in 6 months with repeat IVC/Iliac and LLE  Nathanial Rancher, [facility] [phone]   Clinic MD:  [place]/ Randie Heinz

## 2022-06-13 ENCOUNTER — Ambulatory Visit (INDEPENDENT_AMBULATORY_CARE_PROVIDER_SITE_OTHER)
Admission: RE | Admit: 2022-06-13 | Discharge: 2022-06-13 | Disposition: A | Discharge status: Home / Self Care | Payer: 59 | Source: Ambulatory Visit | Attending: Vascular Surgery | Admitting: Vascular Surgery

## 2022-06-13 ENCOUNTER — Ambulatory Visit (INDEPENDENT_AMBULATORY_CARE_PROVIDER_SITE_OTHER): Payer: 59 | Admitting: Physician Assistant

## 2022-06-13 ENCOUNTER — Ambulatory Visit (HOSPITAL_COMMUNITY)
Admission: RE | Admit: 2022-06-13 | Discharge: 2022-06-13 | Disposition: A | Discharge status: Home / Self Care | Payer: 59 | Source: Ambulatory Visit | Attending: Vascular Surgery | Admitting: Vascular Surgery

## 2022-06-13 VITALS — BP 103/70 | HR 75 | Temp 98.6°F | Ht 63.0 in | Wt 109.0 lb

## 2022-06-13 DIAGNOSIS — I871 Compression of vein: Secondary | ICD-10-CM

## 2022-06-13 DIAGNOSIS — I82409 Acute embolism and thrombosis of unspecified deep veins of unspecified lower extremity: Secondary | ICD-10-CM | POA: Insufficient documentation

## 2022-06-13 DIAGNOSIS — I82422 Acute embolism and thrombosis of left iliac vein: Secondary | ICD-10-CM

## 2022-06-14 DIAGNOSIS — R739 Hyperglycemia, unspecified: Secondary | ICD-10-CM | POA: Diagnosis not present

## 2022-06-14 DIAGNOSIS — I82509 Chronic embolism and thrombosis of unspecified deep veins of unspecified lower extremity: Secondary | ICD-10-CM | POA: Diagnosis not present

## 2022-06-14 DIAGNOSIS — D6859 Other primary thrombophilia: Secondary | ICD-10-CM | POA: Diagnosis not present

## 2022-06-14 DIAGNOSIS — Z0000 Encounter for general adult medical examination without abnormal findings: Secondary | ICD-10-CM | POA: Diagnosis not present

## 2022-06-14 DIAGNOSIS — R69 Illness, unspecified: Secondary | ICD-10-CM | POA: Diagnosis not present

## 2022-06-14 DIAGNOSIS — D509 Iron deficiency anemia, unspecified: Secondary | ICD-10-CM | POA: Diagnosis not present

## 2022-06-14 DIAGNOSIS — Z1331 Encounter for screening for depression: Secondary | ICD-10-CM | POA: Diagnosis not present

## 2022-06-14 DIAGNOSIS — E785 Hyperlipidemia, unspecified: Secondary | ICD-10-CM | POA: Diagnosis not present

## 2022-06-14 DIAGNOSIS — E559 Vitamin D deficiency, unspecified: Secondary | ICD-10-CM | POA: Diagnosis not present

## 2022-06-19 ENCOUNTER — Telehealth: Payer: Self-pay | Admitting: Physician Assistant

## 2022-06-19 NOTE — Telephone Encounter (Signed)
Called patient to r/s her with a new provider for second opinion. Patient informed that tests recommended by previous MD are not feasible. Patient notified of new appointment with new provider.

## 2022-06-19 NOTE — Telephone Encounter (Signed)
Called patient to get her moved to Irene's schedule. Left voicemail requesting patient contact us to make the move.

## 2022-06-20 DIAGNOSIS — Z1159 Encounter for screening for other viral diseases: Secondary | ICD-10-CM | POA: Diagnosis not present

## 2022-06-20 DIAGNOSIS — D509 Iron deficiency anemia, unspecified: Secondary | ICD-10-CM | POA: Diagnosis not present

## 2022-06-20 DIAGNOSIS — Z114 Encounter for screening for human immunodeficiency virus [HIV]: Secondary | ICD-10-CM | POA: Diagnosis not present

## 2022-06-20 DIAGNOSIS — R69 Illness, unspecified: Secondary | ICD-10-CM | POA: Diagnosis not present

## 2022-06-26 ENCOUNTER — Other Ambulatory Visit: Payer: Self-pay

## 2022-06-26 DIAGNOSIS — I82422 Acute embolism and thrombosis of left iliac vein: Secondary | ICD-10-CM

## 2022-07-03 ENCOUNTER — Other Ambulatory Visit: Payer: Self-pay | Admitting: Physician Assistant

## 2022-07-03 DIAGNOSIS — I82409 Acute embolism and thrombosis of unspecified deep veins of unspecified lower extremity: Secondary | ICD-10-CM

## 2022-07-04 ENCOUNTER — Telehealth: Payer: Self-pay | Admitting: Physician Assistant

## 2022-07-04 ENCOUNTER — Inpatient Hospital Stay: Payer: 59

## 2022-07-04 ENCOUNTER — Encounter: Payer: Self-pay | Admitting: Physician Assistant

## 2022-07-04 ENCOUNTER — Inpatient Hospital Stay: Payer: 59 | Attending: Hematology and Oncology | Admitting: Physician Assistant

## 2022-07-04 ENCOUNTER — Other Ambulatory Visit: Payer: Self-pay

## 2022-07-04 VITALS — BP 107/69 | HR 95 | Temp 97.2°F | Resp 18 | Wt 109.1 lb

## 2022-07-04 DIAGNOSIS — Z86718 Personal history of other venous thrombosis and embolism: Secondary | ICD-10-CM | POA: Insufficient documentation

## 2022-07-04 DIAGNOSIS — I82409 Acute embolism and thrombosis of unspecified deep veins of unspecified lower extremity: Secondary | ICD-10-CM

## 2022-07-04 DIAGNOSIS — D649 Anemia, unspecified: Secondary | ICD-10-CM

## 2022-07-04 DIAGNOSIS — Z86711 Personal history of pulmonary embolism: Secondary | ICD-10-CM | POA: Diagnosis not present

## 2022-07-04 DIAGNOSIS — F17210 Nicotine dependence, cigarettes, uncomplicated: Secondary | ICD-10-CM | POA: Insufficient documentation

## 2022-07-04 DIAGNOSIS — R69 Illness, unspecified: Secondary | ICD-10-CM | POA: Diagnosis not present

## 2022-07-04 DIAGNOSIS — Z7901 Long term (current) use of anticoagulants: Secondary | ICD-10-CM | POA: Diagnosis not present

## 2022-07-04 LAB — CBC WITH DIFFERENTIAL (CANCER CENTER ONLY)
Abs Immature Granulocytes: 0 10*3/uL (ref 0.00–0.07)
Basophils Absolute: 0 10*3/uL (ref 0.0–0.1)
Basophils Relative: 0 %
Eosinophils Absolute: 0.1 10*3/uL (ref 0.0–0.5)
Eosinophils Relative: 1 %
HCT: 30.7 % — ABNORMAL LOW (ref 36.0–46.0)
Hemoglobin: 9.6 g/dL — ABNORMAL LOW (ref 12.0–15.0)
Immature Granulocytes: 0 %
Lymphocytes Relative: 28 %
Lymphs Abs: 1.7 10*3/uL (ref 0.7–4.0)
MCH: 26.3 pg (ref 26.0–34.0)
MCHC: 31.3 g/dL (ref 30.0–36.0)
MCV: 84.1 fL (ref 80.0–100.0)
Monocytes Absolute: 0.3 10*3/uL (ref 0.1–1.0)
Monocytes Relative: 6 %
Neutro Abs: 4 10*3/uL (ref 1.7–7.7)
Neutrophils Relative %: 65 %
Platelet Count: 242 10*3/uL (ref 150–400)
RBC: 3.65 MIL/uL — ABNORMAL LOW (ref 3.87–5.11)
RDW: 15.7 % — ABNORMAL HIGH (ref 11.5–15.5)
WBC Count: 6.1 10*3/uL (ref 4.0–10.5)
nRBC: 0 % (ref 0.0–0.2)

## 2022-07-04 LAB — CMP (CANCER CENTER ONLY)
ALT: 6 U/L (ref 0–44)
AST: 11 U/L — ABNORMAL LOW (ref 15–41)
Albumin: 4.5 g/dL (ref 3.5–5.0)
Alkaline Phosphatase: 65 U/L (ref 38–126)
Anion gap: 7 (ref 5–15)
BUN: 5 mg/dL — ABNORMAL LOW (ref 6–20)
CO2: 27 mmol/L (ref 22–32)
Calcium: 9.8 mg/dL (ref 8.9–10.3)
Chloride: 105 mmol/L (ref 98–111)
Creatinine: 0.73 mg/dL (ref 0.44–1.00)
GFR, Estimated: >60 mL/min (ref >60)
Glucose, Bld: 85 mg/dL (ref 70–99)
Potassium: 3.7 mmol/L (ref 3.5–5.1)
Sodium: 139 mmol/L (ref 135–145)
Total Bilirubin: 0.5 mg/dL (ref 0.3–1.2)
Total Protein: 7.5 g/dL (ref 6.5–8.1)

## 2022-07-04 LAB — PROTIME-INR
INR: 1.6 — ABNORMAL HIGH (ref 0.8–1.2)
Prothrombin Time: 18.5 seconds — ABNORMAL HIGH (ref 11.4–15.2)

## 2022-07-04 LAB — FERRITIN: Ferritin: 5 ng/mL — ABNORMAL LOW (ref 11–307)

## 2022-07-04 LAB — IRON AND IRON BINDING CAPACITY (CC-WL,HP ONLY)
Iron: 35 ug/dL (ref 28–170)
Saturation Ratios: 7 % — ABNORMAL LOW (ref 10.4–31.8)
TIBC: 489 ug/dL — ABNORMAL HIGH (ref 250–450)
UIBC: 454 ug/dL — ABNORMAL HIGH (ref 148–442)

## 2022-07-04 MED ORDER — ENOXAPARIN SODIUM 60 MG/0.6ML IJ SOSY: 1.0000 mg/kg | PREFILLED_SYRINGE | Freq: Two times a day (BID) | INTRAMUSCULAR | 0 refills | Status: DC

## 2022-07-04 MED ORDER — WARFARIN SODIUM 5 MG PO TABS: 5.0000 mg | ORAL_TABLET | Freq: Every day | ORAL | 0 refills | Status: DC

## 2022-07-04 NOTE — Progress Notes (Signed)
Patient Care Team: Toppin, Antionette Poles, MD as PCP - General (Internal Medicine) Nicholas Lose, MD as Consulting Physician (Hematology and Oncology) Cherre Robins, MD as Consulting Physician (Vascular Surgery) Cordelia Poche as Physician Assistant (Hematology and Oncology)  DIAGNOSIS:  Encounter Diagnoses  Name Primary?   Anemia, unspecified type Yes   Recurrent deep vein thrombosis (DVT) (HCC)     CHIEF COMPLIANT: Recurrent DVT   CURRENT TREATMENT: Indefinite anticoagulation with Eliquis   INTERVAL HISTORY: Amber Carroll is a 37 y.o. female who returns for a follow up visit. Ms. Grieder reports that the swelling in her left leg has improved since her most recent hospitalization. She still has some discomfort behind the knee and in her calf. She is compliant with taking Eliquis and ASA as prescribed. She denies any shortness of breath or swelling in her right lower extremity. She denies easy bruising or signs of active bleeding. She is otherwise feeling well and has no other complaints. She denies fevers, chills, sweats, shortness of breath, chest pain or cough. Rest of the 10 point ROS is below.   REVIEW OF SYSTEMS:   Constitutional: Negative for appetite change, fatigue, chills, fever and unexpected weight change HENT: Negative for mouth sores, nosebleeds, sore throat and trouble swallowing.   Eyes: Negative for eye problems and icterus.  Respiratory: Negative for cough, hemoptysis, shortness of breath and wheezing.   Cardiovascular: Negative for chest pain. +left leg pain.  Gastrointestinal: Negative for abdominal pain, constipation, diarrhea, nausea and vomiting.  Genitourinary: Negative for bladder incontinence, difficulty urinating, dysuria, frequency and hematuria.   Musculoskeletal: Negative for back pain, gait problem, neck pain and neck stiffness.  Skin:Negative for rash and ulcers Neurological: Negative for dizziness, extremity weakness, gait problem, headaches,  light-headedness and seizures.  Hematological: Negative for adenopathy. Does not bruise/bleed easily.  Psychiatric/Behavioral: Negative for confusion, depression and sleep disturbance. The patient is not nervous/anxious.    Past Medical History:  Diagnosis Date   Pulmonary embolism ([place]) 01/30/2009   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)    Social History   Socioeconomic History   Marital status: Legally Separated    Spouse name: Not on file   Number of children: Not on file   Years of education: Not on file   Highest education level: Not on file  Occupational History   Not on file  Tobacco Use   Smoking status: Every Day    Packs/day: 0.50    Years: 14.00    Total pack years: 7.00    Types: Cigarettes   Smokeless tobacco: Never  Substance and Sexual Activity   Alcohol use: No   Drug use: No   Sexual activity: Not Currently  Other Topics Concern   Not on file  Social History Narrative   Not on file   Social Determinants of Health   Financial Resource Strain: Low Risk  (06/19/2021)   Overall Financial Resource Strain (CARDIA)    Difficulty of Paying Living Expenses: Not hard at all  Food Insecurity: No Food Insecurity (06/19/2021)   Hunger Vital Sign    Worried About Running Out of Food in the Last Year: Never true    Ran Out of Food in the Last Year: Never true  Transportation Needs: No Transportation Needs (06/19/2021)   PRAPARE - Hydrologist (Medical): No    Lack of Transportation (Non-Medical): No  Physical Activity: Insufficiently Active (06/19/2021)   Exercise Vital Sign  Days of Exercise per Week: 1 day    Minutes of Exercise per Session: 60 min  Stress: Stress Concern Present (06/19/2021)   [employer] of Occupational Health - Occupational Stress Questionnaire    Feeling of Stress : Rather much  Social Connections: Moderately Isolated (06/19/2021)   Social Connection and Isolation  Panel [NHANES]    Frequency of Communication with Friends and Family: More than three times a week    Frequency of Social Gatherings with Friends and Family: More than three times a week    Attends Religious Services: More than 4 times per year    Active Member of Clubs or Organizations: No    Attends Banker Meetings: Never    Marital Status: Divorced   Past Surgical History:  Procedure Laterality Date   INTRAVASCULAR ULTRASOUND/IVUS Left 08/17/2021   Procedure: Intravascular Ultrasound/IVUS;  Surgeon: Cephus Shelling, MD;  Location: [facility] INVASIVE CV [facility];  Service: Cardiovascular;  Laterality: Left;  Venous   PATENT FORAMEN OVALE(PFO) CLOSURE     PERIPHERAL VASCULAR THROMBECTOMY N/A 08/17/2021   Procedure: PERIPHERAL VASCULAR THROMBECTOMY;  Surgeon: Cephus Shelling, MD;  Location: [facility] INVASIVE CV LAB;  Service: Cardiovascular;  Laterality: N/A;  placement of catheter   PERIPHERAL VASCULAR THROMBECTOMY N/A 08/18/2021   Procedure: LYSIS RECHECK;  Surgeon: Leonie Douglas, MD;  Location: [facility] INVASIVE CV [facility];  Service: Cardiovascular;  Laterality: N/A;   PERIPHERAL VASCULAR THROMBECTOMY N/A 05/14/2022   Procedure: PERIPHERAL VASCULAR THROMBECTOMY;  Surgeon: Maeola Harman, MD;  Location: [facility] INVASIVE CV LAB;  Service: Cardiovascular;  Laterality: N/A;   Family History  Problem Relation Age of Onset   Factor V Leiden deficiency Father      ALLERGIES:  is allergic to coconut (cocos nucifera), ivp dye [iodinated contrast media], latex, and tessalon [benzonatate].  MEDICATIONS:  Current Outpatient Medications  Medication Sig Dispense Refill   acetaminophen (TYLENOL) 325 MG tablet Take 2 tablets (650 mg total) by mouth every 4 (four) hours as needed for headache or mild pain.     albuterol (VENTOLIN HFA) 108 (90 Base) MCG/ACT inhaler Inhale 2 puffs into the lungs every 6 (six) hours as needed for wheezing or shortness of breath.     ALPRAZolam (XANAX) 0.5 MG tablet  Take 0.5 mg by mouth 2 (two) times daily as needed for anxiety or sleep.     aspirin EC 81 MG tablet Take 1 tablet (81 mg total) by mouth daily. Swallow whole. 30 tablet 12   cetirizine (ZYRTEC ALLERGY) 10 MG tablet Take 1 tablet (10 mg total) by mouth daily. 30 tablet 1   enoxaparin (LOVENOX) 60 MG/0.6ML injection Inject 0.5 mLs (50 mg total) into the skin every 12 (twelve) hours for 7 days. Complete 7 day bridge before starting coumadin therapy. 7 mL 0   levocetirizine (XYZAL) 5 MG tablet Take 5 mg by mouth daily.     montelukast (SINGULAIR) 10 MG tablet Take 10 mg by mouth daily.     warfarin (COUMADIN) 5 MG tablet Take 1 tablet (5 mg total) by mouth daily. 30 tablet 0   No current facility-administered medications for this visit.    PHYSICAL EXAMINATION: ECOG PERFORMANCE STATUS: 1 - Symptomatic but completely ambulatory  Vitals:   07/04/22 0852  BP: 107/69  Pulse: 95  Resp: 18  Temp: (!) 97.2 F (36.2 C)  SpO2: 100%    Filed Weights   07/04/22 0852  Weight: 109 lb 2 oz (49.5 kg)    Constitutional: Oriented to  person, place, and time and well-developed, well-nourished, and in no distress.  HENT:  Head: Normocephalic and atraumatic.  Eyes: Conjunctivae are normal. Right eye exhibits no discharge. Left eye exhibits no discharge. No scleral icterus.  Neck: Normal range of motion. Neck supple.   Cardiovascular: Normal rate, regular rhythm, normal heart sounds and intact distal pulses.   Pulmonary/Chest: Effort normal and breath sounds normal. No respiratory distress. No wheezes. No rales.  Abdominal: Soft. Bowel sounds are normal. Exhibits no distension and no mass. There is no tenderness.  Musculoskeletal: Normal range of motion. Exhibits no edema.  Lymphadenopathy: No cervical adenopathy.  Neurological: Alert and oriented to person, place, and time. Exhibits normal muscle tone. Gait normal. Coordination normal.  Skin: Skin is warm and dry. No rash noted. Not diaphoretic. No  erythema. No pallor.  Psychiatric: Mood, memory and judgment normal.   LABORATORY DATA:  I have reviewed the data as listed    Latest Ref Rng & Units 05/15/2022   12:58 AM 05/13/2022    5:07 PM 05/13/2022    4:22 PM  CMP  Glucose 70 - 99 mg/dL 595  638  756   BUN 6 - 20 mg/dL 12  6  6    Creatinine 0.44 - 1.00 mg/dL  4.33  2.95   Sodium 135 - 145 mmol/L 136  139  137   Potassium 3.5 - 5.1 mmol/L 4.6  3.7  3.7   Chloride 98 - 111 mmol/L 107  107  108   CO2 22 - 32 mmol/L 21   21   Calcium 8.9 - 10.3 mg/dL 7.9   9.0   Total Protein 6.5 - 8.1 g/dL 5.3     Total Bilirubin 0.3 - 1.2 mg/dL 0.4     Alkaline Phos 38 - 126 U/L 42     AST 15 - 41 U/L 15     ALT 0 - 44 U/L 8       Lab Results  Component Value Date   WBC 16.0 (H) 05/15/2022   HGB 7.0 (L) 05/15/2022   HCT 21.5 (L) 05/15/2022   MCV 91.5 05/15/2022   PLT 152 05/15/2022   NEUTROABS 5.3 05/13/2022    ASSESSMENT & PLAN:  Krystyna Cleckley is a 37 y.o. female who returns to the clinic for continued management of recurrent DVTs while on indefinite anticoagulation.   #Recurrent VTEs -History of PE and RLL DVT in 2010 after starting oral contraceptive pills. Hypercoagulable workup form 02/08/2009 was unremarkable except for mild protein S deficiency but not reprodcible on repeat. She was treated with coumadin for 6 months.  -Developed bilateral PE and LLE DVT on 08/23/2020. Discharged on Xarelto -Represented with continued LLE pain and numbness March 2022. 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  -She underwent DVT thrombectomy of left common/external iliac vein with angioplasty of left common iliac vein performed by interventional radiology on 08/31/2020. She was found to have PFO on TEE. She was transitioned to Eliquis.  -Underwent Angioplasty/stent from left common iliac vein through left common femoral vein in June  2022 -Patient was hospitalized from 08/15/2021-08/19/2021 due to in-stent stenosis of mid to distal left common iliac vein status post restenting --Hospitalized again from 05/13/2022-05/15/2022  with repeat occlusion of left common and external iliac vein stents. She was started on IV heparin at admission and her home Eliquis was held. She underwent left pelvic and  IVC angiography with mechanical thrombectomy and balloon angioplasty of left common femoral vein, left external iliac vein, and left common iliac vein. Continues on Eliquis and started ASA 81 mg daily.  --Discussed failure on Eliquis therapy versus stent failure. Recommend to try a different anticoagulant so we will plan to bridge with Lovenox and then transition to coumadin therapy. We will start with coumadin 5 mg daily and modify dose based on INR levels.  --We will send a referral today for patient to establish care with PCP in [facility] to help manage long term coumadin therapy --RTC approximately 10 days for INR check. Follow up visit in 3 months with labs.   #Anemia: --Most recent labs from PCP 06/14/2022: Hgb 8.9.  --Suspect iron deficiency anemia so we will check iron panel today.   Orders Placed This Encounter  Procedures   Ferritin    Standing Status:   Future    Number of Occurrences:   1    Standing Expiration Date:   07/05/2023   Iron and Iron Binding Capacity ([facility] only)    Standing Status:   Future    Number of Occurrences:   1    Standing Expiration Date:   07/05/2023   Protime-INR    Standing Status:   Future    Number of Occurrences:   1    Standing Expiration Date:   07/05/2023   Ambulatory referral to Internal Medicine    Referral Priority:   Urgent    Referral Type:   Consultation    Referral Reason:   Specialty Services Required    Requested Specialty:   Internal Medicine    Number of Visits Requested:   1   The patient has a good understanding of the overall plan.   I have spent a total of 40  minutes minutes of face-to-face and non-face-to-face time, preparing to see the patient, obtaining and/or reviewing separately obtained history, performing a medically appropriate examination, counseling and educating the patient, ordering medications/tests/procedures, referring and communicating with other health care professionals, documenting clinical information in the electronic health record,and care coordination.   Dede Query PA-C Dept of Hematology and [place] at [facility] Phone: [phone]  Patient was seen with Dr. Lorenso Courier as she is transferring care from Dr. Lindi Adie.  I have read the above note and personally examined the patient. I agree with the assessment and plan as noted above.  Briefly Ms. Chonita Gadea is a 37 year old female who presents for evaluation of Eliquis failure.  She has rethrombosis of a vascular stent which occurs while on Eliquis therapy.  At this time I would agree with discontinuation of Eliquis and transition to a different blood thinner.  I would not recommend Xarelto in the setting, however I do think that Coumadin would be appropriate.  Discussed with patient bridging for therapeutic levels of Coumadin with Lovenox and then continuation of Coumadin therapy.  Additionally could consider Pradaxa if patient is unable to tolerate or prefers not to be on Coumadin therapy.  Pradaxa however has its own challenges with expense, insurance coverage.  The patient voiced understanding of our plan moving forward.   Ledell Peoples, MD Department of Hematology/Oncology [place] [facility] Phone: [phone] Pager: [phone] Email: [email or web address]

## 2022-07-04 NOTE — Telephone Encounter (Signed)
Called patient to schedule f/u. Patient notified.  

## 2022-07-05 ENCOUNTER — Other Ambulatory Visit: Payer: Self-pay | Admitting: Physician Assistant

## 2022-07-05 MED ORDER — FERROUS SULFATE 325 (65 FE) MG PO TBEC: 325.0000 mg | DELAYED_RELEASE_TABLET | Freq: Every day | ORAL | 3 refills | Status: DC

## 2022-07-06 ENCOUNTER — Telehealth: Payer: Self-pay

## 2022-07-06 NOTE — Telephone Encounter (Signed)
-----   Message from Lincoln Brigham, PA-C sent at 07/05/2022  5:07 PM EST ----- Please notify patient that her iron levels are low but anemia has improved. Recommend to start iron pills, ferrous sulfate 325 mg once a day. I sent a prescription.

## 2022-07-06 NOTE — Telephone Encounter (Signed)
Pt advised with VU 

## 2022-07-06 NOTE — Telephone Encounter (Signed)
Spoke with pt regarding her lab results and she asked about the  instructions for the lovenox and coumadin.  She is out of town now until Monday.  She is currently taking the Eliquis and aspirin and did not know when to start the lovenox or Coumadin.   Also who will monitor her INRs since she still does not have a PCP.

## 2022-07-06 NOTE — Telephone Encounter (Signed)
She can start lovenox any time. Instructions are to bridge with lovenox for 7 days and then switch to coumadin. I have a lab check on 1/15 scheduled for her to check INR levels but if she hasn't started then can you please move her lab check to 1/17 or 1/18?  We can manage her coumadin dose until she establishes with PCP. I placed a referral to internal medicine team, can you please check status on referral?

## 2022-07-10 ENCOUNTER — Telehealth: Payer: Self-pay | Admitting: Physician Assistant

## 2022-07-10 ENCOUNTER — Other Ambulatory Visit: Payer: Self-pay | Admitting: Physician Assistant

## 2022-07-10 MED ORDER — DABIGATRAN ETEXILATE MESYLATE 150 MG PO CAPS: 150.0000 mg | ORAL_CAPSULE | Freq: Two times a day (BID) | ORAL | 3 refills | Status: DC

## 2022-07-10 NOTE — Telephone Encounter (Signed)
Refilled 07/10/22

## 2022-07-10 NOTE — Telephone Encounter (Signed)
I called Amber Carroll in response to her patient advice request. She reports that she is unable to inject herself with lovenox due to her fear of needles. She attempted to administer the lovenox injection yesterday but was unable to do so. I asked if she is willing to have a family member or friend administer the lovenox injection but she is fearful of that as well. I discussed Dr. Libby Maw recommendations for alternative therapies. We recommend to try Pradaxa 150 mg twice daily. She was agreeable to change therapies so I sent a prescription for Pradaxa to her pharmacy on file.

## 2022-07-12 ENCOUNTER — Telehealth: Payer: Self-pay

## 2022-07-12 NOTE — Telephone Encounter (Signed)
Called pt to see if she received her prescription for pradaxa. Pt did receive her medication and is asking if she needs to continue her aspirin.  Per Irene:I would follow up with her vascular team about aspirin use   having s/s of restless leg syndrome. Any recommendations for this? Per Murray Hodgkins: she can discuss RLS with PCP   Lincoln Brigham, PA-C  LM for pt with recommendations

## 2022-07-16 ENCOUNTER — Other Ambulatory Visit: Payer: 59

## 2022-08-08 DIAGNOSIS — Z6821 Body mass index (BMI) 21.0-21.9, adult: Secondary | ICD-10-CM | POA: Diagnosis not present

## 2022-08-08 DIAGNOSIS — N898 Other specified noninflammatory disorders of vagina: Secondary | ICD-10-CM | POA: Diagnosis not present

## 2022-08-22 DIAGNOSIS — J00 Acute nasopharyngitis [common cold]: Secondary | ICD-10-CM | POA: Diagnosis not present

## 2022-08-22 DIAGNOSIS — Z20822 Contact with and (suspected) exposure to covid-19: Secondary | ICD-10-CM | POA: Diagnosis not present

## 2022-08-22 DIAGNOSIS — R5383 Other fatigue: Secondary | ICD-10-CM | POA: Diagnosis not present

## 2022-08-22 DIAGNOSIS — Z6820 Body mass index (BMI) 20.0-20.9, adult: Secondary | ICD-10-CM | POA: Diagnosis not present

## 2022-08-22 DIAGNOSIS — Z03818 Encounter for observation for suspected exposure to other biological agents ruled out: Secondary | ICD-10-CM | POA: Diagnosis not present

## 2022-09-05 ENCOUNTER — Telehealth: Payer: 59 | Admitting: Hematology and Oncology

## 2022-09-15 DIAGNOSIS — R062 Wheezing: Secondary | ICD-10-CM | POA: Diagnosis not present

## 2022-09-15 DIAGNOSIS — J069 Acute upper respiratory infection, unspecified: Secondary | ICD-10-CM | POA: Diagnosis not present

## 2022-09-17 DIAGNOSIS — J069 Acute upper respiratory infection, unspecified: Secondary | ICD-10-CM | POA: Diagnosis not present

## 2022-09-17 DIAGNOSIS — R0982 Postnasal drip: Secondary | ICD-10-CM | POA: Diagnosis not present

## 2022-09-17 DIAGNOSIS — R059 Cough, unspecified: Secondary | ICD-10-CM | POA: Diagnosis not present

## 2022-09-18 ENCOUNTER — Ambulatory Visit: Payer: 59 | Admitting: Obstetrics and Gynecology

## 2022-10-15 ENCOUNTER — Other Ambulatory Visit: Payer: Self-pay | Admitting: Physician Assistant

## 2022-10-15 DIAGNOSIS — I82409 Acute embolism and thrombosis of unspecified deep veins of unspecified lower extremity: Secondary | ICD-10-CM

## 2022-10-16 ENCOUNTER — Inpatient Hospital Stay: Payer: 59 | Attending: Hematology and Oncology

## 2022-10-16 ENCOUNTER — Inpatient Hospital Stay (HOSPITAL_BASED_OUTPATIENT_CLINIC_OR_DEPARTMENT_OTHER): Payer: 59 | Admitting: Physician Assistant

## 2022-10-16 ENCOUNTER — Telehealth: Payer: Self-pay | Admitting: Physician Assistant

## 2022-10-16 VITALS — BP 108/67 | HR 81 | Temp 97.9°F | Resp 15 | Wt 111.4 lb

## 2022-10-16 DIAGNOSIS — I82409 Acute embolism and thrombosis of unspecified deep veins of unspecified lower extremity: Secondary | ICD-10-CM | POA: Diagnosis not present

## 2022-10-16 DIAGNOSIS — Z7902 Long term (current) use of antithrombotics/antiplatelets: Secondary | ICD-10-CM | POA: Insufficient documentation

## 2022-10-16 DIAGNOSIS — N920 Excessive and frequent menstruation with regular cycle: Secondary | ICD-10-CM | POA: Diagnosis not present

## 2022-10-16 DIAGNOSIS — D500 Iron deficiency anemia secondary to blood loss (chronic): Secondary | ICD-10-CM | POA: Insufficient documentation

## 2022-10-16 DIAGNOSIS — F17210 Nicotine dependence, cigarettes, uncomplicated: Secondary | ICD-10-CM | POA: Insufficient documentation

## 2022-10-16 DIAGNOSIS — Z86718 Personal history of other venous thrombosis and embolism: Secondary | ICD-10-CM | POA: Diagnosis not present

## 2022-10-16 DIAGNOSIS — Z86711 Personal history of pulmonary embolism: Secondary | ICD-10-CM | POA: Insufficient documentation

## 2022-10-16 LAB — CBC WITH DIFFERENTIAL (CANCER CENTER ONLY)
Abs Immature Granulocytes: 0.02 10*3/uL (ref 0.00–0.07)
Basophils Absolute: 0 10*3/uL (ref 0.0–0.1)
Basophils Relative: 1 %
Eosinophils Absolute: 0.2 10*3/uL (ref 0.0–0.5)
Eosinophils Relative: 2 %
HCT: 32.3 % — ABNORMAL LOW (ref 36.0–46.0)
Hemoglobin: 10.2 g/dL — ABNORMAL LOW (ref 12.0–15.0)
Immature Granulocytes: 0 %
Lymphocytes Relative: 25 %
Lymphs Abs: 1.6 10*3/uL (ref 0.7–4.0)
MCH: 26.4 pg (ref 26.0–34.0)
MCHC: 31.6 g/dL (ref 30.0–36.0)
MCV: 83.7 fL (ref 80.0–100.0)
Monocytes Absolute: 0.4 10*3/uL (ref 0.1–1.0)
Monocytes Relative: 7 %
Neutro Abs: 4.2 10*3/uL (ref 1.7–7.7)
Neutrophils Relative %: 65 %
Platelet Count: 233 10*3/uL (ref 150–400)
RBC: 3.86 MIL/uL — ABNORMAL LOW (ref 3.87–5.11)
RDW: 20.5 % — ABNORMAL HIGH (ref 11.5–15.5)
Smear Review: NORMAL
WBC Count: 6.5 10*3/uL (ref 4.0–10.5)
nRBC: 0 % (ref 0.0–0.2)

## 2022-10-16 LAB — CMP (CANCER CENTER ONLY)
ALT: 7 U/L (ref 0–44)
AST: 11 U/L — ABNORMAL LOW (ref 15–41)
Albumin: 4.1 g/dL (ref 3.5–5.0)
Alkaline Phosphatase: 59 U/L (ref 38–126)
Anion gap: 5 (ref 5–15)
BUN: 9 mg/dL (ref 6–20)
CO2: 25 mmol/L (ref 22–32)
Calcium: 9.3 mg/dL (ref 8.9–10.3)
Chloride: 108 mmol/L (ref 98–111)
Creatinine: 0.79 mg/dL (ref 0.44–1.00)
GFR, Estimated: >60 mL/min (ref >60)
Glucose, Bld: 94 mg/dL (ref 70–99)
Potassium: 3.5 mmol/L (ref 3.5–5.1)
Sodium: 138 mmol/L (ref 135–145)
Total Bilirubin: 0.2 mg/dL — ABNORMAL LOW (ref 0.3–1.2)
Total Protein: 7.5 g/dL (ref 6.5–8.1)

## 2022-10-16 LAB — PROTIME-INR
INR: 1.6 — ABNORMAL HIGH (ref 0.8–1.2)
Prothrombin Time: 18.5 seconds — ABNORMAL HIGH (ref 11.4–15.2)

## 2022-10-16 MED ORDER — DABIGATRAN ETEXILATE MESYLATE 150 MG PO CAPS: 150.0000 mg | ORAL_CAPSULE | Freq: Two times a day (BID) | ORAL | 6 refills | Status: DC

## 2022-10-16 NOTE — Telephone Encounter (Signed)
Scheduled patient per 4/16 LOS, patient is aware of date and time of appointments.

## 2022-10-16 NOTE — Telephone Encounter (Signed)
Reached out to patient to schedule per 4/16 LOS, left voicemail.

## 2022-10-16 NOTE — Progress Notes (Signed)
Patient Care Team: Toppin, [name], MD as PCP - General (Internal Medicine) [name], MD as Consulting Physician (Hematology and Oncology) Leonie Douglas, MD as Consulting Physician (Vascular Surgery) Raymondo Band as Physician Assistant (Hematology and Oncology)  DIAGNOSIS:  Encounter Diagnosis  Name Primary?   Iron deficiency anemia due to chronic blood loss Yes    CHIEF COMPLIANT: Recurrent DVT   CURRENT TREATMENT: Indefinite anticoagulation with Pradaxa   INTERVAL HISTORY: Amber Carroll is a 37 y.o. female who returns for a follow up visit. Amber Carroll reports she is tolerating Pradaxa therapy without any issues.  She pain or swelling in her lower extremities.  She denies easy bruising or signs of active bleeding. Patient is otherwise feeling well without any concerning symptoms.  She denies fevers, chills, night sweats, shortness of breath, chest pain, cough, nausea, vomiting or diarrhea. Rest of the 10 point ROS is below.   REVIEW OF SYSTEMS:   Constitutional: Negative for appetite change, fatigue, chills, fever and unexpected weight change HENT: Negative for mouth sores, nosebleeds, sore throat and trouble swallowing.   Eyes: Negative for eye problems and icterus.  Respiratory: Negative for cough, hemoptysis, shortness of breath and wheezing.   Cardiovascular: Negative for chest pain. Gastrointestinal: Negative for abdominal pain, constipation, diarrhea, nausea and vomiting.  Genitourinary: Negative for bladder incontinence, difficulty urinating, dysuria, frequency and hematuria.   Musculoskeletal: Negative for back pain, gait problem, neck pain and neck stiffness.  Skin:Negative for rash and ulcers Neurological: Negative for dizziness, extremity weakness, gait problem, headaches, light-headedness and seizures.  Hematological: Negative for adenopathy. Does not bruise/bleed easily.  Psychiatric/Behavioral: Negative for confusion, depression and sleep  disturbance. The patient is not nervous/anxious.    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)    Social History   Socioeconomic History   Marital status: Legally Separated    Spouse name: Not on file   Number of children: Not on file   Years of education: Not on file   Highest education level: Not on file  Occupational History   Not on file  Tobacco Use   Smoking status: Every Day    Packs/day: 0.50    Years: 14.00    Additional pack years: 0.00    Total pack years: 7.00    Types: Cigarettes   Smokeless tobacco: Never  Substance and Sexual Activity   Alcohol use: No   Drug use: No   Sexual activity: Not Currently  Other Topics Concern   Not on file  Social History Narrative   Not on file   Social Determinants of Health   Financial Resource Strain: Low Risk  (06/19/2021)   Overall Financial Resource Strain (CARDIA)    Difficulty of Paying Living Expenses: Not hard at all  Food Insecurity: No Food Insecurity (06/19/2021)   Hunger Vital Sign    Worried About Running Out of Food in the Last Year: Never true    Ran Out of Food in the Last Year: Never true  Transportation Needs: No Transportation Needs (06/19/2021)   PRAPARE - Administrator, Civil Service (Medical): No    Lack of Transportation (Non-Medical): No  Physical Activity: Insufficiently Active (06/19/2021)   Exercise Vital Sign    Days of Exercise per Week: 1 day    Minutes of Exercise per Session: 60 min  Stress: Stress Concern Present (06/19/2021)   [place]  [employer] Occup[employer] - Occupational Stress Questionnaire    Feeling of Stress : Rather much  Social Connections: Moderately Isolated (06/19/2021)   Social Connection and Isolation Panel [NHANES]    Frequency of Communication with Friends and Family: More than three times a week    Frequency of Social Gatherings with  Friends and Family: More than three times a week    Attends Religious Services: More than 4 times per year    Active Member of Clubs or Organizations: No    Attends Banker Meetings: Never    Marital Status: Divorced   Past Surgical History:  Procedure Laterality Date   CORONARY ULTRASOUND/IVUS Left 08/17/2021   Procedure: Intravascular Ultrasound/IVUS;  Surgeon: Cephus Shelling, MD;  Location: [facility] INVASIVE CV LAB;  Service: Cardiovascular;  Laterality: Left;  Venous   PATENT FORAMEN OVALE(PFO) CLOSURE     PERIPHERAL VASCULAR THROMBECTOMY N/A 08/17/2021   Procedure: PERIPHERAL VASCULAR THROMBECTOMY;  Surgeon: Cephus Shelling, MD;  Location: [facility] INVASIVE CV LAB;  Service: Cardiovascular;  Laterality: N/A;  placement of catheter   PERIPHERAL VASCULAR THROMBECTOMY N/A 08/18/2021   Procedure: LYSIS RECHECK;  Surgeon: Leonie Douglas, MD;  Location: [facility] INVASIVE CV LAB;  Service: Cardiovascular;  Laterality: N/A;   PERIPHERAL VASCULAR THROMBECTOMY N/A 05/14/2022   Procedure: PERIPHERAL VASCULAR THROMBECTOMY;  Surgeon: Maeola Harman, MD;  Location: [facility] INVASIVE CV LAB;  Service: Cardiovascular;  Laterality: N/A;   Family History  Problem Relation Age of Onset   Factor V Leiden deficiency Father      ALLERGIES:  is allergic to coconut (cocos nucifera), ivp dye [iodinated contrast media], latex, tessalon [benzonatate], and yellow dye.  MEDICATIONS:  Current Outpatient Medications  Medication Sig Dispense Refill   acetaminophen (TYLENOL) 325 MG tablet Take 2 tablets (650 mg total) by mouth every 4 (four) hours as needed for headache or mild pain.     albuterol (VENTOLIN HFA) 108 (90 Base) MCG/ACT inhaler Inhale 2 puffs into the lungs every 6 (six) hours as needed for wheezing or shortness of breath.     ALPRAZolam (XANAX) 0.5 MG tablet Take 0.5 mg by mouth 2 (two) times daily as needed for anxiety or sleep.     aspirin EC 81 MG tablet Take 1 tablet (81 mg total) by  mouth daily. Swallow whole. 30 tablet 12   cetirizine (ZYRTEC ALLERGY) 10 MG tablet Take 1 tablet (10 mg total) by mouth daily. 30 tablet 1   ferrous sulfate 325 (65 FE) MG EC tablet Take 1 tablet (325 mg total) by mouth daily with breakfast. 30 tablet 3   levocetirizine (XYZAL) 5 MG tablet Take 5 mg by mouth daily.     montelukast (SINGULAIR) 10 MG tablet Take 10 mg by mouth daily.     dabigatran (PRADAXA) 150 MG CAPS capsule Take 1 capsule (150 mg total) by mouth 2 (two) times daily. 60 capsule 6   No current facility-administered medications for this visit.    PHYSICAL EXAMINATION: ECOG PERFORMANCE STATUS: 1 - Symptomatic but completely ambulatory  Vitals:   10/16/22 0953  BP: 108/67  Pulse: 81  Resp: 15  Temp: 97.9 F (36.6 C)  SpO2: 100%    Filed Weights   10/16/22 0953  Weight: 111 lb 6.4 oz (50.5 kg)    Constitutional: Oriented to person, place, and time and well-developed, well-nourished, and in no distress.  HENT:  Head: Normocephalic and atraumatic.  Eyes: Conjunctivae are normal. Right eye exhibits no discharge. Left eye exhibits  no discharge. No scleral icterus.  Cardiovascular: Normal rate, regular rhythm, normal heart sounds and intact distal pulses.   Pulmonary/Chest: Effort normal and breath sounds normal. No respiratory distress. No wheezes. No rales.  Musculoskeletal: Normal range of motion. Exhibits no edema.  Neurological: Alert and oriented to person, place, and time. Exhibits normal muscle tone. Gait normal. Coordination normal.  Skin: Skin is warm and dry. No rash noted. Not diaphoretic. No erythema. No pallor.  Psychiatric: Mood, memory and judgment normal.   LABORATORY DATA:  I have reviewed the data as listed    Latest Ref Rng & Units 10/16/2022    9:25 AM 07/04/2022   10:26 AM 05/15/2022   12:58 AM  CMP  Glucose 70 - 99 mg/dL 94  85  191   BUN 6 - 20 mg/dL Creatinine 0.44 - 1.00 mg/dL 4.78  2.95  6.21   Sodium 135 - 145 mmol/L 138   139  136   Potassium 3.5 - 5.1 mmol/L 3.5  3.7  4.6   Chloride 98 - 111 mmol/L 108  105  107   CO2 22 - 32 mmol/L Calcium 8.9 - 10.3 mg/dL 9.3  9.8  7.9   Total Protein 6.5 - 8.1 g/dL 7.5  7.5  5.3   Total Bilirubin 0.3 - 1.2 mg/dL 0.2  0.5  0.4   Alkaline Phos 38 - 126 U/L 59  65  42   AST 15 - 41 U/L ALT 0 - 44 U/L Lab Results  Component Value Date   WBC 6.5 10/16/2022   HGB 10.2 (L) 10/16/2022   HCT 32.3 (L) 10/16/2022   MCV 83.7 10/16/2022   PLT 233 10/16/2022   NEUTROABS 4.2 10/16/2022    ASSESSMENT & PLAN:  Amber Carroll is a 37 y.o. female who returns to the clinic for continued management of recurrent DVTs while on indefinite anticoagulation.   #Recurrent VTEs -History of PE and RLL DVT in 2010 after starting oral contraceptive pills. Hypercoagulable workup form 02/08/2009 was unremarkable except for mild protein S deficiency but not reprodcible on repeat. She was treated with coumadin for 6 months.  -Developed bilateral PE and LLE DVT on 08/23/2020. Discharged on Xarelto -Represented with continued LLE pain and numbness March 2022. 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  -She underwent DVT thrombectomy of left common/external iliac vein with angioplasty of left common iliac vein performed by interventional radiology on 08/31/2020. She was found to have PFO on TEE. She was transitioned to Eliquis.  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 She was started on IV heparin at admission and her home Eliquis was held. She  underwent left pelvic and IVC angiography with mechanical thrombectomy and balloon angioplasty of left common femoral vein, left external iliac vein, and left common iliac vein. Like failure on Eliquis therapy versus stent failure.  --Patient transferred care to Dr. Leonides Schanz and it was recommend to switch to Lovenox bridge and then coumadin but patient was  unable to administer the Lovenox injection due to fear of needles. Patient was ultimately switched to Pradaxa 150 mg twice daily in January 2024. PLAN: --Tolerating pradaxa therapy without any prohibitive toxicities including bruising or bleeding --Labs from today were reviewed and adequate to continue therapy. Sent refill today.  --RTC in 6 months with labs  #Iron deficiency anemia 2/2 menstrual bleeding: --Patient did not start ferrous sulfate prescription due to large tablet size --Currently taking iron gummies --Hgb is 10.2 today --We will arrange for IV iron to bolster iron levels. We will try IV monoferric x 1 dose since patient lives 4 hours away and cannot come for multiple infusions.  --Plan for lab only check at local [facility] [place], [place] in 3 months.   No orders of the defined types were placed in this encounter.  The patient has a good understanding of the overall plan.   I have spent a total of 30 minutes minutes of face-to-face and non-face-to-face time, preparing to see the patient,  performing a medically appropriate examination, counseling and educating the patient, ordering medications/tests/procedures, documenting clinical information in the electronic health record,and care coordination.   Georga Kaufmann PA-C Dept of Hematology and Oncology [facility]

## 2022-10-17 ENCOUNTER — Other Ambulatory Visit: Payer: Self-pay

## 2022-10-17 ENCOUNTER — Telehealth: Payer: Self-pay | Admitting: Physician Assistant

## 2022-10-17 ENCOUNTER — Encounter: Payer: Self-pay | Admitting: Physician Assistant

## 2022-10-17 DIAGNOSIS — D500 Iron deficiency anemia secondary to blood loss (chronic): Secondary | ICD-10-CM

## 2022-10-17 NOTE — Telephone Encounter (Signed)
Per pt's request office note and labs faxed to [facility] to establish care for treatment for IDA. Faxed to [phone].  Confirmation received

## 2022-10-17 NOTE — Telephone Encounter (Signed)
Reached out to patient to move iron to week days, patient states she lives at [facility] and works there during the week, request to have iron given at this location, sent message to nurse.

## 2022-10-24 DIAGNOSIS — D509 Iron deficiency anemia, unspecified: Secondary | ICD-10-CM | POA: Diagnosis not present

## 2022-10-26 ENCOUNTER — Telehealth: Payer: Self-pay | Admitting: Physician Assistant

## 2022-10-26 DIAGNOSIS — D509 Iron deficiency anemia, unspecified: Secondary | ICD-10-CM | POA: Diagnosis not present

## 2022-10-27 ENCOUNTER — Ambulatory Visit: Payer: 59

## 2022-10-29 ENCOUNTER — Encounter: Payer: Self-pay | Admitting: Physician Assistant

## 2022-10-29 NOTE — Telephone Encounter (Signed)
Error

## 2022-11-03 ENCOUNTER — Ambulatory Visit: Payer: 59

## 2022-11-09 ENCOUNTER — Other Ambulatory Visit: Payer: Self-pay | Admitting: Physician Assistant

## 2022-12-12 ENCOUNTER — Encounter: Payer: Self-pay | Admitting: Physician Assistant

## 2022-12-19 ENCOUNTER — Telehealth: Payer: Self-pay | Admitting: *Deleted

## 2022-12-19 NOTE — Telephone Encounter (Signed)
TCT patient regarding her her dental clearance and her Pradaxa and ASA. Spoke with her. Advised that she will need to stop her Pradaxa for 4 days prior to her procedure and 7 days for the aspirin. Advised that she needs a definitive date for her procedure before stopping her meds. Pt states she was told by the dental office that her procedure was going to be this week and she stopped her meds last Thursday. She never received a call from the dental office to confirm date. Advised that she resume the Pradaxa and the Aspirin today and not get scheduled for the procedure until the 28 th at the earliest. Pt voiced understanding.  Medical clearance related to pradaxa and ASA has been fax'd to [facility] office. Received receipt of this fax.

## 2022-12-20 DIAGNOSIS — K0889 Other specified disorders of teeth and supporting structures: Secondary | ICD-10-CM | POA: Diagnosis not present

## 2022-12-20 DIAGNOSIS — M62838 Other muscle spasm: Secondary | ICD-10-CM | POA: Diagnosis not present

## 2022-12-31 ENCOUNTER — Other Ambulatory Visit: Payer: Self-pay

## 2022-12-31 ENCOUNTER — Encounter (HOSPITAL_COMMUNITY): Payer: Self-pay

## 2022-12-31 ENCOUNTER — Inpatient Hospital Stay (HOSPITAL_COMMUNITY)
Admission: EM | Admit: 2022-12-31 | Discharge: 2023-01-05 | DRG: 271 | Disposition: A | Discharge status: Home / Self Care | Payer: 59 | Attending: Internal Medicine | Admitting: Internal Medicine

## 2022-12-31 ENCOUNTER — Emergency Department (HOSPITAL_COMMUNITY): Payer: 59

## 2022-12-31 DIAGNOSIS — T82868A Thrombosis of vascular prosthetic devices, implants and grafts, initial encounter: Principal | ICD-10-CM | POA: Diagnosis present

## 2022-12-31 DIAGNOSIS — Z832 Family history of diseases of the blood and blood-forming organs and certain disorders involving the immune mechanism: Secondary | ICD-10-CM | POA: Diagnosis not present

## 2022-12-31 DIAGNOSIS — F418 Other specified anxiety disorders: Secondary | ICD-10-CM | POA: Diagnosis not present

## 2022-12-31 DIAGNOSIS — I82412 Acute embolism and thrombosis of left femoral vein: Secondary | ICD-10-CM | POA: Diagnosis present

## 2022-12-31 DIAGNOSIS — Z888 Allergy status to other drugs, medicaments and biological substances status: Secondary | ICD-10-CM

## 2022-12-31 DIAGNOSIS — Z86718 Personal history of other venous thrombosis and embolism: Secondary | ICD-10-CM

## 2022-12-31 DIAGNOSIS — T82858A Stenosis of vascular prosthetic devices, implants and grafts, initial encounter: Secondary | ICD-10-CM | POA: Diagnosis not present

## 2022-12-31 DIAGNOSIS — Z7902 Long term (current) use of antithrombotics/antiplatelets: Secondary | ICD-10-CM | POA: Diagnosis not present

## 2022-12-31 DIAGNOSIS — Z86711 Personal history of pulmonary embolism: Secondary | ICD-10-CM

## 2022-12-31 DIAGNOSIS — Z7982 Long term (current) use of aspirin: Secondary | ICD-10-CM | POA: Diagnosis not present

## 2022-12-31 DIAGNOSIS — Z91018 Allergy to other foods: Secondary | ICD-10-CM

## 2022-12-31 DIAGNOSIS — D696 Thrombocytopenia, unspecified: Secondary | ICD-10-CM | POA: Diagnosis present

## 2022-12-31 DIAGNOSIS — D649 Anemia, unspecified: Secondary | ICD-10-CM | POA: Diagnosis present

## 2022-12-31 DIAGNOSIS — Y838 Other surgical procedures as the cause of abnormal reaction of the patient, or of later complication, without mention of misadventure at the time of the procedure: Secondary | ICD-10-CM | POA: Diagnosis present

## 2022-12-31 DIAGNOSIS — F17210 Nicotine dependence, cigarettes, uncomplicated: Secondary | ICD-10-CM | POA: Diagnosis not present

## 2022-12-31 DIAGNOSIS — I82422 Acute embolism and thrombosis of left iliac vein: Secondary | ICD-10-CM | POA: Diagnosis present

## 2022-12-31 DIAGNOSIS — I8290 Acute embolism and thrombosis of unspecified vein: Secondary | ICD-10-CM | POA: Diagnosis not present

## 2022-12-31 DIAGNOSIS — M62838 Other muscle spasm: Secondary | ICD-10-CM | POA: Diagnosis present

## 2022-12-31 DIAGNOSIS — Z79899 Other long term (current) drug therapy: Secondary | ICD-10-CM | POA: Diagnosis not present

## 2022-12-31 DIAGNOSIS — J449 Chronic obstructive pulmonary disease, unspecified: Secondary | ICD-10-CM | POA: Diagnosis not present

## 2022-12-31 DIAGNOSIS — Z91040 Latex allergy status: Secondary | ICD-10-CM

## 2022-12-31 DIAGNOSIS — I871 Compression of vein: Secondary | ICD-10-CM | POA: Diagnosis present

## 2022-12-31 DIAGNOSIS — Z9889 Other specified postprocedural states: Secondary | ICD-10-CM | POA: Diagnosis not present

## 2022-12-31 DIAGNOSIS — Z91041 Radiographic dye allergy status: Secondary | ICD-10-CM | POA: Diagnosis not present

## 2022-12-31 DIAGNOSIS — D72829 Elevated white blood cell count, unspecified: Secondary | ICD-10-CM | POA: Diagnosis present

## 2022-12-31 DIAGNOSIS — I745 Embolism and thrombosis of iliac artery: Secondary | ICD-10-CM | POA: Diagnosis not present

## 2022-12-31 DIAGNOSIS — Z9102 Food additives allergy status: Secondary | ICD-10-CM

## 2022-12-31 LAB — CBC
HCT: 38.4 % (ref 36.0–46.0)
Hemoglobin: 12 g/dL (ref 12.0–15.0)
MCH: 28.6 pg (ref 26.0–34.0)
MCHC: 31.3 g/dL (ref 30.0–36.0)
MCV: 91.6 fL (ref 80.0–100.0)
Platelets: 197 10*3/uL (ref 150–400)
RBC: 4.19 MIL/uL (ref 3.87–5.11)
RDW: 17.1 % — ABNORMAL HIGH (ref 11.5–15.5)
WBC: 11 10*3/uL — ABNORMAL HIGH (ref 4.0–10.5)
nRBC: 0 % (ref 0.0–0.2)

## 2022-12-31 LAB — BASIC METABOLIC PANEL
Anion gap: 11 (ref 5–15)
BUN: 13 mg/dL (ref 6–20)
CO2: 22 mmol/L (ref 22–32)
Calcium: 8.8 mg/dL — ABNORMAL LOW (ref 8.9–10.3)
Chloride: 104 mmol/L (ref 98–111)
Creatinine, Ser: 0.71 mg/dL (ref 0.44–1.00)
GFR, Estimated: >60 mL/min (ref >60)
Glucose, Bld: 86 mg/dL (ref 70–99)
Potassium: 4.3 mmol/L (ref 3.5–5.1)
Sodium: 137 mmol/L (ref 135–145)

## 2022-12-31 LAB — HIV ANTIBODY (ROUTINE TESTING W REFLEX): HIV Screen 4th Generation wRfx: NONREACTIVE

## 2022-12-31 LAB — APTT: aPTT: 26 seconds (ref 24–36)

## 2022-12-31 LAB — HEPARIN LEVEL (UNFRACTIONATED): Heparin Unfractionated: <0.1 IU/mL — ABNORMAL LOW (ref 0.30–0.70)

## 2022-12-31 MED ORDER — ONDANSETRON HCL 4 MG PO TABS: 4.0000 mg | ORAL_TABLET | Freq: Four times a day (QID) | ORAL | Status: DC | PRN

## 2022-12-31 MED ORDER — ENOXAPARIN SODIUM 40 MG/0.4ML IJ SOSY
40.0000 mg | PREFILLED_SYRINGE | INTRAMUSCULAR | Status: DC
Start: 2022-12-31 — End: 2022-12-31

## 2022-12-31 MED ORDER — ASPIRIN 81 MG PO TBEC
81.0000 mg | DELAYED_RELEASE_TABLET | Freq: Every day | ORAL | Status: DC
  Administered 2022-12-31 – 2023-01-05 (×6): 81 mg via ORAL
  Filled 2022-12-31 (×6): qty 1

## 2022-12-31 MED ORDER — OXYCODONE HCL 5 MG PO TABS
5.0000 mg | ORAL_TABLET | ORAL | Status: DC | PRN
  Administered 2022-12-31 – 2023-01-05 (×17): 5 mg via ORAL
  Filled 2022-12-31 (×18): qty 1

## 2022-12-31 MED ORDER — ONDANSETRON HCL 4 MG/2ML IJ SOLN
4.0000 mg | Freq: Four times a day (QID) | INTRAMUSCULAR | Status: DC | PRN
  Administered 2023-01-02: 4 mg via INTRAVENOUS
  Filled 2022-12-31: qty 2

## 2022-12-31 MED ORDER — METHYLPREDNISOLONE SODIUM SUCC 40 MG IJ SOLR
40.0000 mg | Freq: Once | INTRAMUSCULAR | Status: AC
  Administered 2022-12-31: 40 mg via INTRAVENOUS
  Filled 2022-12-31: qty 1

## 2022-12-31 MED ORDER — KETOROLAC TROMETHAMINE 15 MG/ML IJ SOLN
15.0000 mg | Freq: Once | INTRAMUSCULAR | Status: AC
  Administered 2022-12-31: 15 mg via INTRAVENOUS
  Filled 2022-12-31: qty 1

## 2022-12-31 MED ORDER — DIPHENHYDRAMINE HCL 50 MG/ML IJ SOLN
50.0000 mg | Freq: Once | INTRAMUSCULAR | Status: AC
  Administered 2022-12-31: 50 mg via INTRAVENOUS
  Filled 2022-12-31: qty 1

## 2022-12-31 MED ORDER — ACETAMINOPHEN 650 MG RE SUPP: 650.0000 mg | Freq: Four times a day (QID) | RECTAL | Status: DC | PRN

## 2022-12-31 MED ORDER — ACETAMINOPHEN 325 MG PO TABS: 650.0000 mg | ORAL_TABLET | Freq: Four times a day (QID) | ORAL | Status: DC | PRN

## 2022-12-31 MED ORDER — DIPHENHYDRAMINE HCL 25 MG PO CAPS: 50.0000 mg | ORAL_CAPSULE | Freq: Once | ORAL | Status: AC

## 2022-12-31 MED ORDER — IOHEXOL 350 MG/ML SOLN
100.0000 mL | Freq: Once | INTRAVENOUS | Status: AC | PRN
  Administered 2022-12-31: 100 mL via INTRAVENOUS

## 2022-12-31 MED ORDER — HEPARIN (PORCINE) 25000 UT/250ML-% IV SOLN
950.0000 [IU]/h | INTRAVENOUS | Status: DC
  Administered 2022-12-31: 900 [IU]/h via INTRAVENOUS
  Administered 2023-01-02: 950 [IU]/h via INTRAVENOUS
  Filled 2022-12-31 (×2): qty 250

## 2022-12-31 NOTE — Progress Notes (Signed)
ANTICOAGULATION CONSULT NOTE - Initial Consult  Pharmacy Consult for heparin infusion Indication: DVT  Allergies  Allergen Reactions   Coconut (Cocos Nucifera) Anaphylaxis   Ivp Dye [Iodinated Contrast Media] Anaphylaxis and Hives   Latex Anaphylaxis   Tessalon [Benzonatate] Anaphylaxis   Yellow Dye Anaphylaxis    Other Reaction(s): Not available    Patient Measurements: Height: 5\' 3"  (160 cm) Weight: 52.2 kg (115 lb) IBW/kg (Calculated) : 52.4 Heparin Dosing Weight: 52.2 kg  Vital Signs: Temp: 98.8 F (37.1 C) (07/01 1943) Temp Source: Oral (07/01 1943) BP: 97/58 (07/01 2137) Pulse Rate: 60 (07/01 2137)  Labs: Recent Labs    12/31/22 1322  HGB 12.0  HCT 38.4  PLT 197  CREATININE 0.71    Estimated Creatinine Clearance: 80.1 mL/min (by C-G formula based on SCr of 0.71 mg/dL).   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   Pt reports being instructed to stop taking her dabigatran and aspirin on 6/13 for planned dental surgery. Dental surgery was delayed and patient was instructed to resume dabigatran and aspirin on 6/19 PM and took doses on 6/19 and 6/20, then was instructed to stop dabigatran and aspirin again on 6/21 in anticipation of dental surgery rescheduled for 6/26. Pt underwent tooth extraction on 6/26 AM and resumed dabigatran and aspirin on 6/26 PM. Patient has been taking dabigatran and aspirin as directed since 6/26 PM. Last dose of dabigatran was at 05:00 on 12/31/22. Patient reports left leg pain started on Friday, 12/28/22.  Hgb 12,  Plt 197 No s/sx of bleeding. Pt reports oral surgery is healing and no bleeding issues noted post-op.  Goal of Therapy:  Heparin level 0.3-0.7 units/ml Monitor platelets by anticoagulation protocol: Yes   Plan:  Initiate heparin infusion at 900 units/hr No bolus, given recent dabigatran administration Check baseline heparin level and aPTT Check heparin level in 6 hours Monitor daily CBC, heparin level, and for s/sx of bleeding F/u vascular surgery recommendations and plans for anticoagulation   Wilburn Cornelia, PharmD, BCPS Clinical Pharmacist 12/31/2022 10:25 PM   Please refer to [facility] for pharmacy phone number

## 2022-12-31 NOTE — ED Provider Notes (Signed)
  Physical Exam  BP (!) 101/56   Pulse 65   Temp 98.7 F (37.1 C)   Resp 14   Ht 5\' 3"  (1.6 m)   Wt 52.2 kg   SpO2 97%   BMI 20.37 kg/m   Physical Exam Vitals and nursing note reviewed.  Constitutional:      General: She is not in acute distress.    Appearance: She is well-developed.  HENT:     Head: Normocephalic and atraumatic.  Eyes:     Conjunctiva/sclera: Conjunctivae normal.  Cardiovascular:     Rate and Rhythm: Normal rate and regular rhythm.     Heart sounds: No murmur heard. Pulmonary:     Effort: Pulmonary effort is normal.  Abdominal:     General: There is no distension.     Palpations: Abdomen is soft.     Tenderness: There is no abdominal tenderness.  Musculoskeletal:        General: No swelling.     Cervical back: Neck supple.  Skin:    General: Skin is warm and dry.     Capillary Refill: Capillary refill takes less than 2 seconds.  Neurological:     Mental Status: She is alert.  Psychiatric:        Mood and Affect: Mood normal.     Procedures  Procedures  ED Course / MDM   Clinical Course as of 12/31/22 1512  Mon Dec 31, 2022  1506 Follow up on ? Stent occlusion   [BB]    Clinical Course User Index [BB] Fayrene Helper, MD   Medical Decision Making Received in handoff, 37 year old female history of May-Thurner recent venous graft, stopped taking her DAPT for recent dental procedure here with pain in her left leg.  Suspected DVT.  Awaiting CT, history of allergies to CT contrast, undergoing 4-hour premedication.    CT results concerning for occluded venous graft.  Vascular surgery paged.   Recommended admission for further evaluation, I spoke with the hospitalist who graciously agreed to admit the patient.  Amount and/or Complexity of Data Reviewed External Data Reviewed: radiology. Labs: ordered. Radiology: ordered.  Risk Prescription drug management. Decision regarding hospitalization.         Fayrene Helper, MD 12/31/22  2351    Blane Ohara, MD 01/01/23 2239

## 2022-12-31 NOTE — ED Triage Notes (Addendum)
Pt came in via POV d/t having 7/10 pain while she walks in her Lt leg since [place] & CT that she has a DVT in her Lt leg. Pt endorses she has informed her neurosurgeon she was coming in for eval, Hx of ileac stent placement (per pt). Denies any CP or SOB at this time.

## 2022-12-31 NOTE — ED Provider Notes (Signed)
[place] EMERGENCY DEPARTMENT AT [facility] Provider Note   CSN: [id number] Arrival date & time: 12/31/22  1044     History  Chief Complaint  Patient presents with   Leg Pain   DVT Lt leg    Amber Carroll is a 37 y.o. female.   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  She follows with Dr. Henry Russel of vascular surgery.  Seems that this past week she was seen at outside hospital due to calf pain that she was having and was found to have a DVT.  She has been on Pradaxa and aspirin most recently but has been on DOAC in the past.  She stopped taking her Pradaxa and aspirin for a few days because of a dental operation.  She denies any chest pain or difficulty breathing.  No other associate symptoms.     Home Medications Prior to Admission medications   Medication Sig Start Date End Date Taking? Authorizing Provider  acetaminophen (TYLENOL) 325 MG tablet Take 2 tablets (650 mg total) by mouth every 4 (four) hours as needed for headache or mild pain. 08/19/21   Lonia Blood, MD  albuterol (VENTOLIN HFA) 108 (90 Base) MCG/ACT inhaler Inhale 2 puffs into the lungs every 6 (six) hours as needed for wheezing or shortness of breath.    [provider]  ALPRAZolam Prudy Feeler) 0.5 MG tablet Take 0.5 mg by mouth 2 (two) times daily as needed for anxiety or sleep.    [provider]  aspirin EC 81 MG tablet Take 1 tablet (81 mg total) by mouth daily. Swallow whole. 05/16/22   Schuh, McKenzi P, PA-C  cetirizine (ZYRTEC ALLERGY) 10 MG tablet Take 1 tablet (10 mg total) by mouth daily. 07/09/14   Arthor Captain, PA-C  dabigatran (PRADAXA) 150 MG CAPS capsule Take 1 capsule (150 mg total) by mouth 2 (two) times daily. 10/16/22   Briant Cedar, PA-C  ferrous sulfate 325 (65 FE) MG EC tablet Take 1 tablet (325 mg total) by mouth daily with breakfast.  07/05/22   Georga Kaufmann T, PA-C  levocetirizine (XYZAL) 5 MG tablet Take 5 mg by mouth daily.    [provider]  montelukast (SINGULAIR) 10 MG tablet Take 10 mg by mouth daily.    [provider]      Allergies    Coconut (cocos nucifera), Ivp dye [iodinated contrast media], Latex, Tessalon [benzonatate], and Yellow dye    Review of Systems   Review of Systems  Physical Exam Updated Vital Signs BP (!) 101/56   Pulse 65   Temp 98.7 F (37.1 C)   Resp 14   Ht 5\' 3"  (1.6 m)   Wt 52.2 kg   SpO2 97%   BMI 20.37 kg/m  Physical Exam Vitals and nursing note reviewed.  Constitutional:      General: She is not in acute distress.    Appearance: Normal appearance. She is not ill-appearing.  HENT:     Head: Normocephalic and atraumatic.     Nose: Nose normal.     Mouth/Throat:     Mouth: Mucous membranes are moist.  Eyes:     General: No scleral icterus.       Right eye: No discharge.        Left eye: No discharge.     Conjunctiva/sclera: Conjunctivae normal.  Cardiovascular:  Rate and Rhythm: Normal rate and regular rhythm.     Pulses: Normal pulses.     Heart sounds: Normal heart sounds.  Pulmonary:     Effort: Pulmonary effort is normal. No respiratory distress.     Breath sounds: No stridor. No wheezing.  Abdominal:     Palpations: Abdomen is soft.     Tenderness: There is no abdominal tenderness.  Musculoskeletal:     Cervical back: Normal range of motion.     Right lower leg: No edema.     Left lower leg: No edema.     Comments: DP PT pulse in bilateral lower extremities symmetric and normal  Skin:    General: Skin is warm and dry.     Capillary Refill: Capillary refill takes less than 2 seconds.  Neurological:     Mental Status: She is alert and oriented to person, place, and time. Mental status is at baseline.  Psychiatric:        Mood and Affect: Mood normal.        Behavior: Behavior normal.     ED Results / Procedures / Treatments    Labs (all labs ordered are listed, but only abnormal results are displayed) Labs Reviewed  CBC - Abnormal; Notable for the following components:      Result Value   WBC 11.0 (*)    RDW 17.1 (*)    All other components within normal limits  BASIC METABOLIC PANEL - Abnormal; Notable for the following components:   Calcium 8.8 (*)    All other components within normal limits    EKG None  Radiology No results found.  Procedures Procedures    Medications Ordered in ED Medications  diphenhydrAMINE (BENADRYL) capsule 50 mg (has no administration in time range)    Or  diphenhydrAMINE (BENADRYL) injection 50 mg (has no administration in time range)  methylPREDNISolone sodium succinate (SOLU-MEDROL) 40 mg/mL injection 40 mg (40 mg Intravenous Given 12/31/22 1351)    ED Course/ Medical Decision Making/ A&P Clinical Course as of 12/31/22 1541  Mon Dec 31, 2022  1506 Follow up on ? Stent occlusion   [BB]    Clinical Course User Index [BB] Fayrene Helper, MD                             Medical Decision Making Amount and/or Complexity of Data Reviewed Labs: ordered. Radiology: ordered.  Risk Prescription drug management.   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  She follows with Dr. Henry Russel of vascular surgery.  Seems that this past week she was seen at outside hospital due to calf pain that she was having and was found to have a DVT.  She has been on Pradaxa and aspirin most recently but has been on DOAC in the past.  She stopped taking her Pradaxa and aspirin for a few days because of a dental operation.  She denies any chest pain or difficulty breathing.  No other associate symptoms.   Physical exam generally unremarkable.  She has normal vital signs no chest pain difficulty breathing.  BMP unremarkable CBC without anemia.  CT venogram abdomen pelvis lower  extremities bilaterally ordered.  She has a contrast allergy and is starting her 4-hour prep.  Patient care handed off to evening team.   Final Clinical Impression(s) / ED Diagnoses Final diagnoses:  None    Rx / DC Orders ED Discharge Orders     None         Gailen Shelter, [place] 12/31/22 1541    Gerhard Munch, MD 01/01/23 [phone]

## 2023-01-01 DIAGNOSIS — I8290 Acute embolism and thrombosis of unspecified vein: Secondary | ICD-10-CM

## 2023-01-01 DIAGNOSIS — T82858A Stenosis of vascular prosthetic devices, implants and grafts, initial encounter: Secondary | ICD-10-CM | POA: Diagnosis not present

## 2023-01-01 LAB — CBC
HCT: 33.5 % — ABNORMAL LOW (ref 36.0–46.0)
Hemoglobin: 10.9 g/dL — ABNORMAL LOW (ref 12.0–15.0)
MCH: 29.9 pg (ref 26.0–34.0)
MCHC: 32.5 g/dL (ref 30.0–36.0)
MCV: 91.8 fL (ref 80.0–100.0)
Platelets: 180 10*3/uL (ref 150–400)
RBC: 3.65 MIL/uL — ABNORMAL LOW (ref 3.87–5.11)
RDW: 16.8 % — ABNORMAL HIGH (ref 11.5–15.5)
WBC: 12.2 10*3/uL — ABNORMAL HIGH (ref 4.0–10.5)
nRBC: 0 % (ref 0.0–0.2)

## 2023-01-01 LAB — HEPARIN LEVEL (UNFRACTIONATED)
Heparin Unfractionated: 0.36 IU/mL (ref 0.30–0.70)
Heparin Unfractionated: 0.62 IU/mL (ref 0.30–0.70)

## 2023-01-01 MED ORDER — MIDODRINE HCL 5 MG PO TABS: 5.0000 mg | ORAL_TABLET | Freq: Three times a day (TID) | ORAL | Status: DC

## 2023-01-01 MED ORDER — MIDODRINE HCL 5 MG PO TABS
5.0000 mg | ORAL_TABLET | Freq: Three times a day (TID) | ORAL | Status: DC
  Administered 2023-01-01 – 2023-01-05 (×10): 5 mg via ORAL
  Filled 2023-01-01 (×10): qty 1

## 2023-01-01 MED ORDER — SODIUM CHLORIDE 0.9 % IV BOLUS
1000.0000 mL | Freq: Once | INTRAVENOUS | Status: AC
  Administered 2023-01-01: 1000 mL via INTRAVENOUS

## 2023-01-01 MED ORDER — LACTATED RINGERS IV BOLUS
1000.0000 mL | Freq: Once | INTRAVENOUS | Status: AC
  Administered 2023-01-01: 1000 mL via INTRAVENOUS

## 2023-01-01 NOTE — H&P (Signed)
History and Physical    Amber Carroll BJY:[id number] DOB: 04-15-1986 DOA: 12/31/2022  PCP: Audery Amel, MD   Chief Complaint: leg swelling  HPI: Amber Carroll is a 37 y.o. female with medical history significant of May-Thurner syndrome, recurrent PE on Pradaxa status post stenting, on dual antiplatelet who presented to emergency department due to leg swelling.  Patient was in [facility] and saw ER out there due to calf pain and swelling.  She was discharged and called her vascular surgeon at [facility] who recommended she present to [facility] for further management.  On arrival she was afebrile and hemodynamically stable.  Labs were obtained which showed WBC 11.0, heme globin 12.0, BMP unrevealing, CT venogram showed complete occlusion of left iliac caval venous stent.  Vascular surgery was consulted and recommended admission with morning consultation.  On arrival she endorses swelling in her lower extremity.  She denies any fever chills shortness of breath.  She recently had dental procedure and held Pradaxa for the last 4 days.  She recently saw her hematologist in April.   Review of Systems: Review of Systems  All other systems reviewed and are negative.    As per HPI otherwise 10 point review of systems negative.   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 [facility])     Past Surgical History:  Procedure Laterality Date   CORONARY ULTRASOUND/IVUS Left 08/17/2021   Procedure: Intravascular Ultrasound/IVUS;  Surgeon: Cephus Shelling, MD;  Location: [facility] INVASIVE CV LAB;  Service: Cardiovascular;  Laterality: Left;  Venous   PATENT  FORAMEN OVALE(PFO) CLOSURE     PERIPHERAL VASCULAR THROMBECTOMY N/A 08/17/2021   Procedure: PERIPHERAL VASCULAR THROMBECTOMY;  Surgeon: Cephus Shelling, MD;  Location: [facility] INVASIVE CV LAB;  Service: Cardiovascular;  Laterality: N/A;  placement of catheter   PERIPHERAL VASCULAR THROMBECTOMY N/A 08/18/2021   Procedure: LYSIS RECHECK;  Surgeon: Leonie Douglas, MD;  Location: [facility] INVASIVE CV LAB;  Service: Cardiovascular;  Laterality: N/A;   PERIPHERAL VASCULAR THROMBECTOMY N/A 05/14/2022   Procedure: PERIPHERAL VASCULAR THROMBECTOMY;  Surgeon: Maeola Harman, MD;  Location: [facility] INVASIVE CV LAB;  Service: Cardiovascular;  Laterality: N/A;     reports that she has been smoking cigarettes. She has a 7.00 pack-year smoking history. She has never used smokeless tobacco. She reports that she does not drink alcohol and does not use drugs.  Family History  Problem Relation Age of Onset   Factor V Leiden deficiency Father     Prior to Admission medications   Medication Sig Start Date End Date Taking? Authorizing Provider  albuterol (VENTOLIN HFA) 108 (90 Base) MCG/ACT inhaler Inhale 2 puffs into the lungs every 6 (six) hours as needed for wheezing or shortness of breath.   Yes [provider]  ALPRAZolam Prudy Feeler) 0.5 MG tablet Take 0.5 mg by mouth 2 (two) times daily as needed for anxiety or sleep.   Yes [provider]  amoxicillin (AMOXIL) 500 MG capsule Take 500 mg by mouth 3 (three) times daily.   Yes [provider]  aspirin EC 81 MG tablet Take 1 tablet (81 mg total) by mouth daily. Swallow whole. 05/16/22  Yes Schuh, McKenzi P, PA-C  dabigatran (PRADAXA) 150 MG CAPS capsule Take 1 capsule (150 mg total) by mouth 2 (two) times daily. 10/16/22  Yes Georga Kaufmann T, PA-C  levocetirizine (XYZAL) 5 MG tablet Take 5 mg by mouth daily.   Yes [provider]  montelukast (SINGULAIR) 10 MG tablet Take 10 mg by mouth daily.   Yes [provider]   acetaminophen (TYLENOL) 325 MG tablet Take 2 tablets (650 mg total) by mouth every 4 (four) hours as needed for headache or mild pain. Patient not taking: Reported on 12/31/2022 08/19/21   Lonia Blood, MD  cetirizine (ZYRTEC ALLERGY) 10 MG tablet Take 1 tablet (10 mg total) by mouth daily. Patient not taking: Reported on 12/31/2022 07/09/14   Arthor Captain, PA-C  ferrous sulfate 325 (65 FE) MG EC tablet Take 1 tablet (325 mg total) by mouth daily with breakfast. Patient not taking: Reported on 12/31/2022 07/05/22   Briant Cedar, PA-C    Physical Exam: Vitals:   12/31/22 2330 12/31/22 2333 01/01/23 0058 01/01/23 0100  BP: 102/72  (!) 91/59 (!) 84/54  Pulse: 73  62 (!) 59  Resp: 15  13 13   Temp:  98.7 F (37.1 C)    TempSrc:  Oral    SpO2: 98%  97% 98%  Weight:      Height:       Physical Exam Vitals reviewed.  Constitutional:      Appearance: She is normal weight.  HENT:     Head: Normocephalic.     Nose: Nose normal.     Mouth/Throat:     Mouth: Mucous membranes are moist.     Pharynx: Oropharynx is clear.  Eyes:     Extraocular Movements: Extraocular movements intact.  Cardiovascular:     Rate and Rhythm: Normal rate and regular rhythm.     Pulses: Normal pulses.  Pulmonary:     Effort: Pulmonary effort is normal.  Abdominal:     General: Abdomen is flat. Bowel sounds are normal.  Musculoskeletal:        General: Swelling present.     Cervical back: Normal range of motion.  Skin:    General: Skin is warm.     Capillary Refill: Capillary refill takes less than 2 seconds.  Neurological:     General: No focal deficit present.     Mental Status: She is alert and oriented to person, place, and time. Mental status is at baseline.        Labs on Admission: I have personally reviewed the patients's labs and imaging studies.  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 at midnight in the event of surgery tomorrow Continue aspirin       Admission status: Inpatient Telemetry Medical  Certification: The appropriate patient status for this patient is INPATIENT. Inpatient status is judged to be reasonable and necessary in order to provide the required intensity of service to ensure the patient's safety. The patient's presenting symptoms, physical exam findings, and initial radiographic and laboratory data in the context of their chronic comorbidities is felt to place them at high risk for further clinical deterioration. Furthermore, it is not anticipated that the patient will be  medically stable for discharge from the hospital within 2 midnights of admission.   * I certify that at the point of admission it is my clinical judgment that the patient will require inpatient hospital care spanning beyond 2 midnights from the point of admission due to high intensity of service, high risk for further deterioration and high frequency of surveillance required.Alan Mulder MD [facility] If 7PM-7AM, please contact night-coverage [email or web address]  01/01/2023, 1:03 AM

## 2023-01-01 NOTE — Consult Note (Signed)
Hospital Consult    Reason for Consult: Left lower extremity common iliac vein stent thrombosis Requesting Physician: ED MRN #:  [id number]  History of Present Illness: This is a 37 y.o. female well-known to our service line having undergone left iliac vein stenting for May Thurner syndrome due to recurrent deep venous thrombosis.  She was doing well at her last appointment, but was taken off of her aspirin, Pradaxa for several days prior to molar removal with an oral surgeon.  She presented to the [facility] 2 days ago with Pain and swelling in the left leg.  They were uncomfortable giving her contrast, therefore she had her parents come get her, and presented to [facility] for further workup and treatment. Original stent crossing the inguinal ligament was placed at an outside hospital for may thurner. Most recently 14 x 60 mm Abre-Dr. Lenell Antu in the [facility]  On exam, Reuel Boom was resting comfortably.  She noted pain and fullness in the left thigh.  There is also significant pain when ambulating.  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 [facility])     Past Surgical History:  Procedure Laterality Date   CORONARY ULTRASOUND/IVUS Left 08/17/2021   Procedure: Intravascular Ultrasound/IVUS;  Surgeon: Cephus Shelling, MD;  Location[facility] INVASIVE CV LAB;  Service: Cardiovascular;  Laterality: Left;  Venous   PATENT FORAMEN OVALE(PFO) CLOSURE     PERIPHERAL VASCULAR THROMBECTOMY N/A 08/17/2021   Procedure: PERIPHERAL VASCULAR THROMBECTOMY;  Surgeon: Cephus Shelling, MD;  Location: [facility] INVASIVE CV LAB;  Service: Cardiovascular;  Laterality: N/A;  placement of catheter   PERIPHERAL VASCULAR THROMBECTOMY N/A 08/18/2021   Procedure: LYSIS RECHECK;  Surgeon: Leonie Douglas, MD;  Location: [facility] INVASIVE CV LAB;  Service: Cardiovascular;  Laterality: N/A;   PERIPHERAL VASCULAR THROMBECTOMY N/A  05/14/2022   Procedure: PERIPHERAL VASCULAR THROMBECTOMY;  Surgeon: Maeola Harman, MD;  Location: [facility] INVASIVE CV LAB;  Service: Cardiovascular;  Laterality: N/A;    Allergies  Allergen Reactions   Coconut (Cocos Nucifera) Anaphylaxis   Ivp Dye [Iodinated Contrast Media] Anaphylaxis and Hives   Latex Anaphylaxis   Tessalon [Benzonatate] Anaphylaxis   Yellow Dye Anaphylaxis    Other Reaction(s): Not available    Prior to Admission medications   Medication Sig Start Date End Date Taking? Authorizing Provider  albuterol (VENTOLIN HFA) 108 (90 Base) MCG/ACT inhaler Inhale 2 puffs into the lungs every 6 (six) hours as needed for wheezing or shortness of breath.   Yes [provider]  ALPRAZolam Prudy Feeler) 0.5 MG tablet Take 0.5 mg by mouth 2 (two) times daily as needed for anxiety or sleep.   Yes [provider]  amoxicillin (AMOXIL) 500 MG capsule Take 500 mg by mouth 3 (three) times daily.   Yes [provider]  aspirin EC 81 MG tablet Take 1 tablet (81 mg total) by mouth daily. Swallow whole. 05/16/22  Yes Schuh, McKenzi P, PA-C  dabigatran (PRADAXA) 150 MG CAPS capsule Take 1 capsule (150 mg total) by mouth 2 (two) times daily. 10/16/22  Yes Georga Kaufmann T, PA-C  levocetirizine (XYZAL) 5 MG tablet Take 5 mg by mouth daily.   Yes [provider]  montelukast (SINGULAIR) 10 MG tablet Take 10 mg by mouth daily.   Yes [provider]  acetaminophen (TYLENOL) 325 MG tablet Take 2 tablets (650 mg total) by mouth every 4 (four) hours as needed  for headache or mild pain. Patient not taking: Reported on 12/31/2022 08/19/21   Lonia Blood, MD  cetirizine (ZYRTEC ALLERGY) 10 MG tablet Take 1 tablet (10 mg total) by mouth daily. Patient not taking: Reported on 12/31/2022 07/09/14   Arthor Captain, PA-C  ferrous sulfate 325 (65 FE) MG EC tablet Take 1 tablet (325 mg total) by mouth daily with breakfast. Patient not taking: Reported on 12/31/2022 07/05/22    Briant Cedar, PA-C    Social History   Socioeconomic History   Marital status: Legally Separated    Spouse name: Not on file   Number of children: Not on file   Years of education: Not on file   Highest education level: Not on file  Occupational History   Not on file  Tobacco Use   Smoking status: Every Day    Packs/day: 0.50    Years: 14.00    Additional pack years: 0.00    Total pack years: 7.00    Types: Cigarettes   Smokeless tobacco: Never  Substance and Sexual Activity   Alcohol use: No   Drug use: No   Sexual activity: Not Currently  Other Topics Concern   Not on file  Social History Narrative   Not on file   Social Determinants of Health   Financial Resource Strain: Low Risk  (06/19/2021)   Overall Financial Resource Strain (CARDIA)    Difficulty of Paying Living Expenses: Not hard at all  Food Insecurity: No Food Insecurity (01/01/2023)   Hunger Vital Sign    Worried About Running Out of Food in the Last Year: Never true    Ran Out of Food in the Last Year: Never true  Transportation Needs: No Transportation Needs (01/01/2023)   PRAPARE - Administrator, Civil Service (Medical): No    Lack of Transportation (Non-Medical): No  Physical Activity: Insufficiently Active (06/19/2021)   Exercise Vital Sign    Days of Exercise per Week: 1 day    Minutes of Exercise per Session: 60 min  Stress: Stress Concern Present (06/19/2021)   [employer] of Occupational Health - Occupational Stress Questionnaire    Feeling of Stress : Rather much  Social Connections: Moderately Isolated (06/19/2021)   Social Connection and Isolation Panel [NHANES]    Frequency of Communication with Friends and Family: More than three times a week    Frequency of Social Gatherings with Friends and Family: More than three times a week    Attends Religious Services: More than 4 times per year    Active Member of [employer] or Organizations: No    Attends Banker  Meetings: Never    Marital Status: Divorced  Catering manager Violence: Not At Risk (01/01/2023)   Humiliation, Afraid, Rape, and Kick questionnaire    Fear of Current or Ex-Partner: No    Emotionally Abused: No    Physically Abused: No    Sexually Abused: No   Family History  Problem Relation Age of Onset   Factor V Leiden deficiency Father     ROS: Otherwise negative unless mentioned in HPI  Physical Examination  Vitals:   01/01/23 0830 01/01/23 0904  BP: 100/64 (!) 110/58  Pulse: (!) 54 62  Resp: 13 18  Temp:  97.7 F (36.5 C)  SpO2: 98% 100%   Body mass index is 20.37 kg/m.  General:  WDWN in NAD Gait: Not observed HENT: WNL, normocephalic Pulmonary: normal non-labored breathing, without Rales, rhonchi,  wheezing Cardiac: regular Abdomen:  soft, NT/ND, no masses Skin: without rashes Vascular Exam/Pulses: 2+ DP Extremities: without ischemic changes, without Gangrene , without cellulitis; without open wounds;  Asymmetric swelling of the thigh and the left leg Musculoskeletal: no muscle wasting or atrophy  Neurologic: A&O X 3;  No focal weakness or paresthesias are detected; speech is fluent/normal Psychiatric:  The pt has Normal affect. Lymph:  Unremarkable  CBC    Component Value Date/Time   WBC 12.2 (H) 01/01/2023 0443   RBC 3.65 (L) 01/01/2023 0443   HGB 10.9 (L) 01/01/2023 0443   HGB 10.2 (L) 10/16/2022 0925   HGB 12.5 08/26/2009 1213   HCT 33.5 (L) 01/01/2023 0443   HCT 38.7 08/26/2009 1213   PLT 180 01/01/2023 0443   PLT 233 10/16/2022 0925   PLT 184 08/26/2009 1213   MCV 91.8 01/01/2023 0443   MCV 88.8 08/26/2009 1213   MCH 29.9 01/01/2023 0443   MCHC 32.5 01/01/2023 0443   RDW 16.8 (H) 01/01/2023 0443   RDW 14.5 08/26/2009 1213   LYMPHSABS 1.6 10/16/2022 0925   LYMPHSABS 1.5 08/26/2009 1213   MONOABS 0.4 10/16/2022 0925   MONOABS 0.3 08/26/2009 1213   EOSABS 0.2 10/16/2022 0925   EOSABS 0.2 08/26/2009 1213   BASOSABS 0.0 10/16/2022 0925    BASOSABS 0.0 08/26/2009 1213    BMET    Component Value Date/Time   NA 137 12/31/2022 1322   K 4.3 12/31/2022 1322   CL 104 12/31/2022 1322   CO2 22 12/31/2022 1322   GLUCOSE 86 12/31/2022 1322   BUN 13 12/31/2022 1322   CREATININE 0.71 12/31/2022 1322   CREATININE 0.79 10/16/2022 0925   CALCIUM 8.8 (L) 12/31/2022 1322   GFRNONAA >60 12/31/2022 1322   GFRNONAA >60 10/16/2022 0925   GFRAA  02/07/2009 2314    >60        The eGFR has been calculated using the MDRD equation. This calculation has not been validated in all clinical situations. eGFR's persistently <60 mL/min signify possible Chronic Kidney Disease.    COAGS: Lab Results  Component Value Date   INR 1.6 (H) 10/16/2022   INR 1.6 (H) 07/04/2022   INR 0.9 06/29/2021   PROTIME 12.0 08/26/2009   PROTIME 19.2 (H) 05/17/2009   PROTIME 18.0 (H) 02/23/2009     ASSESSMENT/PLAN: This is a 37 y.o. female with history of May Thurner status post left iliac vein stenting.  She was recently on anticoagulation resulting in rethrombosis.  After discussing risk and benefits of left leg venography with percutaneous mechanical thrombectomy of the venous stent, Daniel elected to proceed.  Plan will be for percutaneous mechanical thrombectomy of the left common iliac vein stent on Wednesday.  N.p.o. midnight.  Continue heparin   Fara Olden MD [facility] [phone] 01/01/2023  11:21 AM

## 2023-01-01 NOTE — Progress Notes (Signed)
ANTICOAGULATION CONSULT NOTE - Initial Consult  Pharmacy Consult for heparin infusion Indication: DVT  Allergies  Allergen Reactions   Coconut (Cocos Nucifera) Anaphylaxis   Ivp Dye [Iodinated Contrast Media] Anaphylaxis and Hives   Latex Anaphylaxis   Tessalon [Benzonatate] Anaphylaxis   Yellow Dye Anaphylaxis    Other Reaction(s): Not available    Patient Measurements: Height: 5\' 3"  (160 cm) Weight: 52.2 kg (115 lb) IBW/kg (Calculated) : 52.4 Heparin Dosing Weight: 52.2 kg  Vital Signs: Temp: 98.7 F (37.1 C) (07/02 0330) Temp Source: Oral (07/02 0330) BP: 88/53 (07/02 0530) Pulse Rate: 57 (07/02 0530)  Labs: Recent Labs    12/31/22 1322 12/31/22 2225 01/01/23 0443  HGB 12.0  --  10.9*  HCT 38.4  --  33.5*  PLT 197  --  180  APTT  --  26  --   HEPARINUNFRC  --  <0.10* 0.36  CREATININE 0.71  --   --      Estimated Creatinine Clearance: 80.1 mL/min (by C-G formula based on SCr of 0.71 mg/dL).   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   Pt reports being instructed to stop taking her dabigatran and aspirin on 6/13 for planned dental surgery. Dental surgery was delayed and patient was instructed to resume dabigatran and aspirin on 6/19 PM and took doses on 6/19 and 6/20, then was instructed to stop dabigatran and aspirin again on 6/21 in anticipation of dental surgery rescheduled for 6/26. Pt underwent tooth extraction on 6/26 AM and resumed dabigatran and aspirin on 6/26 PM. Patient has been taking dabigatran and aspirin as directed since  6/26 PM. Last dose of dabigatran was at 05:00 on 12/31/22. Patient reports left leg pain started on Friday, 12/28/22.  Hgb 12, Plt 197 No s/sx of bleeding. Pt reports oral surgery is healing and no bleeding issues noted post-op.  Baseline heparin level undetectable, current level therapeutic at 0.36 while on 900u/hr.   Goal of Therapy:  Heparin level 0.3-0.7 units/ml Monitor platelets by anticoagulation protocol: Yes   Plan:  Will slightly increase heparin infusion to 950u/hr given currently at lower range of therapeutic.  Check heparin level in 6 hours Monitor daily CBC, heparin level, and for s/sx of bleeding F/u vascular surgery recommendations and plans for anticoagulation   Wilburn Cornelia, PharmD, BCPS Clinical Pharmacist 01/01/2023 5:41 AM   Please refer to [facility] for pharmacy phone number

## 2023-01-01 NOTE — ED Notes (Signed)
BP 84/52, HR 63. Notified Dr. Antionette Char, additional bolus ordered and started.

## 2023-01-01 NOTE — ED Notes (Signed)
Patient transported to Ultrasound 

## 2023-01-01 NOTE — ED Notes (Signed)
..ED TO INPATIENT HANDOFF REPORT  ED Nurse Name and Phone #: [phone]  S Name/Age/Gender Amber Carroll 37 y.o. female Room/Bed: 044C/044C  Code Status   Code Status: Full Code  Home/SNF/Other Home Patient oriented to: self, place, time, and situation Is this baseline? Yes   Triage Complete: Triage complete  Chief Complaint VTE (venous thromboembolism) [I82.90]  Triage Note Pt came in via POV d/t having 7/10 pain while she walks in her Lt leg since [place] & CT that she has a DVT in her Lt leg. Pt endorses she has informed her neurosurgeon she was coming in for eval, Hx of ileac stent placement (per pt). Denies any CP or SOB at this time.       Allergies Allergies  Allergen Reactions   Coconut (Cocos Nucifera) Anaphylaxis   Ivp Dye [Iodinated Contrast Media] Anaphylaxis and Hives   Latex Anaphylaxis   Tessalon [Benzonatate] Anaphylaxis   Yellow Dye Anaphylaxis    Other Reaction(s): Not available    Level of Care/Admitting Diagnosis ED Disposition     ED Disposition  Admit   Condition  --   Comment  Hospital Area: [facility] [[id number]]  Level of Care: Telemetry Medical [104]  May admit patient to [facility] or [facility] if equivalent level of care is available:: No  Covid Evaluation: Asymptomatic - no recent exposure (last 10 days) testing not required  Diagnosis: VTE (venous thromboembolism) [540981]  Admitting Physician: Alan Mulder [[id number]]  Attending Physician: Alan Mulder [[id number]]  Certification:: I certify this patient will need inpatient services for at least 2 midnights  Estimated Length of Stay: 3          B Medical/Surgery History 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)    Past Surgical History:  Procedure Laterality Date   CORONARY ULTRASOUND/IVUS  Left 08/17/2021   Procedure: Intravascular Ultrasound/IVUS;  Surgeon: Cephus Shelling, MD;  Location[facility] INVAS[facility] CV LAB;  Service: Cardiovascular;  Laterality: Left;  Venous   PATENT FORAMEN OVALE(PFO) CLOSURE     PERIPHERAL VASCULAR THROMBECTOMY N/A 08/17/2021   Procedure: PERIPHERAL VASCULAR THROMBECTOMY;  Surgeon: Cephus Shelling, MD;  Location: [facility] INVASIVE CV LAB;  Service: Cardiovascular;  Laterality: N/A;  placement of catheter   PERIPHERAL VASCULAR THROMBECTOMY N/A 08/18/2021   Procedure: LYSIS RECHECK;  Surgeon: Leonie Douglas, MD;  Location: [facility] INVASIVE CV LAB;  Service: Cardiovascular;  Laterality: N/A;   PERIPHERAL VASCULAR THROMBECTOMY N/A 05/14/2022   Procedure: PERIPHERAL VASCULAR THROMBECTOMY;  Surgeon: Maeola Harman, MD;  Location: [facility] INVASIVE CV LAB;  Service: Cardiovascular;  Laterality: N/A;     A IV Location/Drains/Wounds Patient Lines/Drains/Airways Status     Active Line/Drains/Airways     Name Placement date Placement time Site Days   Peripheral IV 12/31/22 20 G Left Antecubital 12/31/22  1347  Antecubital  1   Peripheral IV 12/31/22 22 G 1" Anterior;Right Wrist 12/31/22  2149  Wrist  1            Intake/Output Last 24 hours No intake or output data in the 24 hours ending 01/01/23 8657  Labs/Imaging Results for orders placed or performed during the hospital encounter of 12/31/22 (from the past 48 hour(s))  CBC     Status: Abnormal   Collection Time: 12/31/22  1:22 PM  Result Value  Ref Range   WBC 11.0 (H) 4.0 - 10.5 K/uL   RBC 4.19 3.87 - 5.11 MIL/uL   Hemoglobin 12.0 12.0 - 15.0 g/dL   HCT 16.1 09.6 - 04.5 %   MCV 91.6 80.0 - 100.0 fL   MCH 28.6 26.0 - 34.0 pg   MCHC 31.3 30.0 - 36.0 g/dL   RDW 40.9 (H) 81.1 - 91.4 %   Platelets 197 150 - 400 K/uL   nRBC 0.0 0.0 - 0.2 %    Comment: Performed at [facility], [address] [place], [place] [address]  Basic metabolic panel     Status: Abnormal   Collection Time:  12/31/22  1:22 PM  Result Value Ref Range   Sodium 137 135 - 145 mmol/L   Potassium 4.3 3.5 - 5.1 mmol/L    Comment: HEMOLYSIS AT THIS LEVEL MAY AFFECT RESULT   Chloride 104 98 - 111 mmol/L   CO2 22 22 - 32 mmol/L   Glucose, Bld 86 70 - 99 mg/dL    Comment: Glucose reference range applies only to samples taken after fasting for at least 8 hours.   BUN 13 6 - 20 mg/dL   Creatinine, Ser 6.21 0.44 - 1.00 mg/dL   Calcium 8.8 (L) 8.9 - 10.3 mg/dL   GFR, Estimated >30 >86 mL/min    Comment: (NOTE) Calculated using the CKD-EPI Creatinine Equation (2021)    Anion gap 11 5 - 15    Comment: Performed at [facility], [address], [place], [place] [address]  HIV Antibody (routine testing w rflx)     Status: None   Collection Time: 12/31/22 10:25 PM  Result Value Ref Range   HIV Screen 4th Generation wRfx Non Reactive Non Reactive    Comment: Performed at [facility], [address], [place], [place] [address]  Heparin level (unfractionated)     Status: Abnormal   Collection Time: 12/31/22 10:25 PM  Result Value Ref Range   Heparin Unfractionated <0.10 (L) 0.30 - 0.70 IU/mL    Comment: (NOTE) The clinical reportable range upper limit is being lowered to >1.10 to align with the FDA approved guidance for the current laboratory assay.  If heparin results are below expected values, and patient dosage has  been confirmed, suggest follow up testing of antithrombin III levels. Performed at [facility], [address] [place], [place] [address]   APTT     Status: None   Collection Time: 12/31/22 10:25 PM  Result Value Ref Range   aPTT 26 24 - 36 seconds    Comment: Performed at [facility], [address], [place], [place] [address]  Heparin level (unfractionated)     Status: None   Collection Time: 01/01/23  4:43 AM  Result Value Ref Range   Heparin Unfractionated 0.36 0.30 - 0.70 IU/mL    Comment: (NOTE) The clinical reportable range upper limit is being  lowered to >1.10 to align with the FDA approved guidance for the current laboratory assay.  If heparin results are below expected values, and patient dosage has  been confirmed, suggest follow up testing of antithrombin III levels. Performed at [facility], [address], [place], [place] [address]   CBC     Status: Abnormal   Collection Time: 01/01/23  4:43 AM  Result Value Ref Range   WBC 12.2 (H) 4.0 - 10.5 K/uL   RBC 3.65 (L) 3.87 - 5.11 MIL/uL  Hemoglobin 10.9 (L) 12.0 - 15.0 g/dL   HCT 16.1 (L) 09.6 - 04.5 %   MCV 91.8 80.0 - 100.0 fL   MCH 29.9 26.0 - 34.0 pg   MCHC 32.5 30.0 - 36.0 g/dL   RDW 40.9 (H) 81.1 - 91.4 %   Platelets 180 150 - 400 K/uL   nRBC 0.0 0.0 - 0.2 %    Comment: Performed at [facility], [address] [place], [place] [address]   CT VENOGRAM ABD/PELVIS/LOWER EXT BILAT  Result Date: 12/31/2022 CLINICAL DATA:  History of May-Thurner syndrome status post prior stent placement. New DVT diagnosis in the left lower extremity. EXAM: CT VENOGRAM ABDOMEN AND PELVIS AND LOWER EXTREMITY BILATERAL TECHNIQUE: Venographic phase images of the abdomen, pelvis and lower extremities were obtained following the administration of intravenous contrast. Multiplanar reformats and maximum intensity projections were generated. RADIATION DOSE REDUCTION: This exam was performed according to the departmental dose-optimization program which includes automated exposure control, adjustment of the mA and/or kV according to patient size and/or use of iterative reconstruction technique. CONTRAST:  OMNIPAQUE IOHEXOL 350 MG/ML SOLN COMPARISON:  CT dated 05/13/2022. FINDINGS: Lower chest: Minimal left lung base atelectasis. The visualized lung bases are otherwise clear. No intra-abdominal free air.  Small free fluid within the pelvis. Hepatobiliary: The liver is unremarkable. No biliary dilatation. The gallbladder is unremarkable. Pancreas: Unremarkable. No pancreatic ductal  dilatation or surrounding inflammatory changes. Spleen: Normal in size without focal abnormality. Adrenals/Urinary Tract: The adrenal glands unremarkable. There is no hydronephrosis on either side. Symmetric enhancement of the kidneys. The visualized ureters and urinary bladder unremarkable. Stomach/Bowel: Moderate stool throughout the colon. There is no bowel obstruction or active inflammation. The appendix is normal. Vascular/Lymphatic: The abdominal aorta and IVC are unremarkable. Left venous stent extending from the common femoral vein into the inferior IVC. There is complete occlusion of the stent with thrombus, progressed since the prior CT. The proximal tip of the thrombus within the stent is approximately 1.3 cm from the proximal tip of the stent in the IVC (previously approximately 2.6 cm). The right iliac veins appear patent. The SMV, splenic vein, and main portal vein are patent. No portal venous gas. There is no adenopathy. Reproductive: The uterus is grossly unremarkable. Probable ruptured right ovarian corpus luteum. No adnexal masses. Other: None Musculoskeletal: No acute or significant osseous findings. IVC: The IVC is unremarkable. Portal and mesenteric veins: Patent. Bilateral iliac veins: Stent extending from the left common femoral vein into the inferior IVC appears completely occluded. Right lower extremity: No evidence for thrombus involving the common femoral, femoral, popliteal and visualized deep calf veins Left lower extremity: Decreased opacification of the visualized veins with low density content in the femoral, deep femoral, and common femoral vein veins which may represent decreased venous return due to occluded stent or represent DVT. IMPRESSION: 1. Complete occlusion of the left ileocaval venous stent extending from the common femoral vein into the inferior IVC, progressed since the prior CT. 2. Decreased opacification of the visualized veins in the left lower extremity may represent  decreased venous return due to occluded stent or represent DVT. 3. No right lower extremity DVT. 4. No bowel obstruction. Normal appendix. Electronically Signed   By: Elgie Collard M.D.   On: 12/31/2022 18:53    Pending Labs Unresulted Labs (From admission, onward)     Start     Ordered   01/02/23 0500  Heparin level (unfractionated)  Daily,   R  12/31/22 2227   01/01/23 1200  Heparin level (unfractionated)  Once-Timed,   TIMED        01/01/23 0542   01/01/23 0500  CBC  Daily,   R      12/31/22 2227            Vitals/Pain Today's Vitals   01/01/23 0715 01/01/23 0730 01/01/23 0745 01/01/23 0800  BP: 95/60 90/65 102/63 93/62  Pulse: (!) 53 (!) 56 66 (!) 59  Resp: 12 12 14 14   Temp:      TempSrc:      SpO2: 97% 97% 96% 96%  Weight:      Height:      PainSc:        Isolation Precautions No active isolations  Medications Medications  aspirin EC tablet 81 mg (81 mg Oral Given 12/31/22 2241)  acetaminophen (TYLENOL) tablet 650 mg (has no administration in time range)    Or  acetaminophen (TYLENOL) suppository 650 mg (has no administration in time range)  oxyCODONE (Oxy IR/ROXICODONE) immediate release tablet 5 mg (5 mg Oral Given 12/31/22 2310)  ondansetron (ZOFRAN) tablet 4 mg (has no administration in time range)    Or  ondansetron (ZOFRAN) injection 4 mg (has no administration in time range)  heparin ADULT infusion 100 units/mL (25000 units/249mL) (950 Units/hr Intravenous Rate/Dose Change 01/01/23 0616)  methylPREDNISolone sodium succinate (SOLU-MEDROL) 40 mg/mL injection 40 mg (40 mg Intravenous Given 12/31/22 1351)  diphenhydrAMINE (BENADRYL) capsule 50 mg ( Oral See Alternative 12/31/22 1647)    Or  diphenhydrAMINE (BENADRYL) injection 50 mg (50 mg Intravenous Given 12/31/22 1647)  ketorolac (TORADOL) 15 MG/ML injection 15 mg (15 mg Intravenous Given 12/31/22 1653)  iohexol (OMNIPAQUE) 350 MG/ML injection 100 mL (100 mLs Intravenous Contrast Given 12/31/22 1805)  lactated  ringers bolus 1,000 mL (0 mLs Intravenous Stopped 01/01/23 0207)  sodium chloride 0.9 % bolus 1,000 mL (0 mLs Intravenous Stopped 01/01/23 0615)  sodium chloride 0.9 % bolus 1,000 mL (1,000 mLs Intravenous New Bag/Given 01/01/23 0624)    Mobility walks     Focused Assessments Cardiac Assessment Handoff:    Lab Results  Component Value Date   CKTOTAL 39 02/08/2009   CKMB 0.5 02/08/2009   TROPONINI <0.01        NO INDICATION OF MYOCARDIAL INJURY. 02/08/2009   Lab Results  Component Value Date   DDIMER 0.26 08/26/2009   Does the Patient currently have chest pain? No   , Neuro Assessment Handoff:  Swallow screen pass? Yes          Neuro Assessment:   Neuro Checks:      Has TPA been given? No If patient is a Neuro Trauma and patient is going to OR before floor call report to 4N Charge nurse: [phone] or [phone]   R Recommendations: See Admitting Provider Note  Report given to:   Additional Notes: Aox4, ambulatory, DVT to left leg, prn pain meds. Next heparin level due @1200 . Hep currently going at 9.60mL/hr

## 2023-01-01 NOTE — ED Notes (Signed)
BP is 79/48 (map: 59), HR 59. Heparin at 900 un/hr. Pt reports CP of 2/10 described as pressure, EKG obtained, refuses tylenol. Provider notified.

## 2023-01-01 NOTE — Progress Notes (Signed)
Brief same day note:  Patient is a 37 year old female with history of May Thurner syndrome, recurrent PE on Pradaxa ,left iliac stenting, who presented to the emergency departmeant with complaint of leg swelling.  She recently had dental procedure and held her Pradaxa/aspirin for 4 days.  She was seen in the emergency department at [facility] due to calf pain, swelling.  Case was discussed with vascular surgery at [facility] and she was advised to come here.    She was afebrile, hemodynamically stable.  Lab work showed leukocytosis of 11.  CT venogram showed complete occlusion of left iliac Venous Stent.  Vascular Surgery Consulted, Started on Heparin Drip.    Patient seen and examined the bedside this morning along with the husband.  She was hemodynamically stable.  Her blood pressure was soft but she chronically runs low.  She is getting some IV fluids.  Heparin drip running.  She complains of some pain on the left lower extremity.  Left leg mildly edematous but not significant.  Assessment and plan:  Recurrent VTE in the setting of May Thurner syndrome: Follows with hematology, vascular surgery.  History of recurrent PE on Pradaxa status post stenting.  Developed calf pain, swelling on the left.  Was not taking Prilosec for 4 days.CT venogram showed complete occlusion of left iliac Venous Stent.  Vascular Surgery Consulted, Started on Heparin Drip.  On aspirin  Leukocytosis: Mild.  Most likely reactive.  Continue to monitor

## 2023-01-01 NOTE — Progress Notes (Signed)
ANTICOAGULATION CONSULT NOTE - Follow-up Consult  Pharmacy Consult for heparin infusion Indication: DVT  Allergies  Allergen Reactions   Coconut (Cocos Nucifera) Anaphylaxis   Ivp Dye [Iodinated Contrast Media] Anaphylaxis and Hives   Latex Anaphylaxis   Tessalon [Benzonatate] Anaphylaxis   Yellow Dye Anaphylaxis    Other Reaction(s): Not available    Patient Measurements: Height: 5\' 3"  (160 cm) Weight: 52.2 kg (115 lb) IBW/kg (Calculated) : 52.4 Heparin Dosing Weight: 52.2 kg  Vital Signs: Temp: 97.7 F (36.5 C) (07/02 0904) Temp Source: Oral (07/02 0904) BP: 110/58 (07/02 0904) Pulse Rate: 62 (07/02 0904)  Labs: Recent Labs    12/31/22 1322 12/31/22 2225 01/01/23 0443 01/01/23 1335  HGB 12.0  --  10.9*  --   HCT 38.4  --  33.5*  --   PLT 197  --  180  --   APTT  --  26  --   --   HEPARINUNFRC  --  <0.10* 0.36 0.62  CREATININE 0.71  --   --   --     Estimated Creatinine Clearance: 80.1 mL/min (by C-G formula based on SCr of 0.71 mg/dL).   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   Past Month   ALPRAZolam (XANAX) 0.5 MG tablet Take 0.5 mg by mouth 2 (two) times daily as needed for anxiety or sleep.   Past Month   amoxicillin (AMOXIL) 500 MG capsule Take 500 mg by mouth 3 (three) times daily.   12/31/2022   aspirin EC 81 MG tablet Take 1 tablet (81 mg total) by mouth daily. Swallow whole. 30 tablet 12 12/30/2022   dabigatran (PRADAXA) 150 MG CAPS capsule Take 1 capsule (150 mg total) by mouth 2 (two) times daily. 60 capsule 6 12/31/2022   levocetirizine (XYZAL) 5 MG tablet Take 5 mg by mouth  daily.   12/31/2022   montelukast (SINGULAIR) 10 MG tablet Take 10 mg by mouth daily.   12/31/2022   acetaminophen (TYLENOL) 325 MG tablet Take 2 tablets (650 mg total) by mouth every 4 (four) hours as needed for headache or mild pain. (Patient not taking: Reported on 12/31/2022)   Not Taking   cetirizine (ZYRTEC ALLERGY) 10 MG tablet Take 1 tablet (10 mg total) by mouth daily. (Patient not taking: Reported on 12/31/2022) 30 tablet 1 Not Taking   ferrous sulfate 325 (65 FE) MG EC tablet Take 1 tablet (325 mg total) by mouth daily with breakfast. (Patient not taking: Reported on 12/31/2022) 30 tablet 3 Not Taking   Assessment: 37 yo F who presents with complete occlusion of L ileocaval venous stent extending from common femoral vein into the inferior IVC and occluded L iliac stent. 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   Pt reports being instructed to stop taking her dabigatran and aspirin on 6/13 for planned dental surgery. Dental surgery was delayed and patient was instructed to resume dabigatran and aspirin on 6/19 PM and took doses on 6/19 and 6/20, then was instructed to stop dabigatran and aspirin again on 6/21 in anticipation of dental surgery rescheduled for  6/26. Pt underwent tooth extraction on 6/26 AM and resumed dabigatran and aspirin on 6/26 PM. Patient has been taking dabigatran and aspirin as directed since 6/26 PM. Last dose of dabigatran was at 05:00 on 12/31/22. Patient reports left leg pain started on Friday, 12/28/22.  Hgb down slightly, Plts stable.  No s/sx of bleeding. Pt reports oral surgery is healing and no bleeding issues noted post-op.  Heparin level therapeutic at 0.62.   Goal of Therapy:  Heparin level 0.3-0.7 units/ml Monitor platelets by anticoagulation protocol: Yes   Plan:  Continue heparin infusion to 950u/hr.   Monitor daily CBC, heparin level, and for s/sx of bleeding F/u vascular surgery recommendations and plans for  anticoagulation  Link Snuffer, PharmD, BCPS, BCCCP Clinical Pharmacist 01/01/2023 2:39 PM   Please refer to [facility] for pharmacy phone number

## 2023-01-01 NOTE — ED Notes (Signed)
Pt BP 84/54, no signs of distress. Provider made aware.

## 2023-01-01 NOTE — Progress Notes (Signed)
Mobility Specialist Progress Note   01/01/23 1700  Therapy Vitals  BP (!) 82/52  Mobility  Activity Ambulated with assistance in hallway  Level of Assistance Contact guard assist, steadying assist  Assistive Device Other (Comment) (IVpole)  Distance Ambulated (ft) 234 ft  Activity Response Tolerated well  Mobility Referral Yes  $Mobility charge 1 Mobility  Mobility Specialist Start Time (ACUTE ONLY) 1730  Mobility Specialist Stop Time (ACUTE ONLY) 1800  Mobility Specialist Time Calculation (min) (ACUTE ONLY) 30 min   Pt received in bed having c/o swelling in LLE but no c/o pain, agreeable to mobility. Pt required no physical assistance throughout hallway ambulation but had an antalgic like gait d/t discomfort in LLE. Returned back to bed w/o fault and present discomfort in LLE but pain subsiding once pt supine and LLE propped in bed. Call bell placed in reach w/ needs met.  Frederico Hamman Mobility Specialist Please contact via SecureChat or  Rehab office at [phone]

## 2023-01-02 ENCOUNTER — Encounter (HOSPITAL_COMMUNITY)
Admission: EM | Disposition: A | Discharge status: Home / Self Care | Payer: Self-pay | Source: Home / Self Care | Attending: Internal Medicine

## 2023-01-02 DIAGNOSIS — Z9889 Other specified postprocedural states: Secondary | ICD-10-CM

## 2023-01-02 DIAGNOSIS — T82858A Stenosis of vascular prosthetic devices, implants and grafts, initial encounter: Secondary | ICD-10-CM

## 2023-01-02 DIAGNOSIS — T82868A Thrombosis of vascular prosthetic devices, implants and grafts, initial encounter: Principal | ICD-10-CM

## 2023-01-02 DIAGNOSIS — I8290 Acute embolism and thrombosis of unspecified vein: Secondary | ICD-10-CM | POA: Diagnosis not present

## 2023-01-02 HISTORY — PX: PERIPHERAL VASCULAR THROMBECTOMY: CATH118306

## 2023-01-02 LAB — CBC
HCT: 32.5 % — ABNORMAL LOW (ref 36.0–46.0)
HCT: 33.2 % — ABNORMAL LOW (ref 36.0–46.0)
Hemoglobin: 10.3 g/dL — ABNORMAL LOW (ref 12.0–15.0)
Hemoglobin: 10.9 g/dL — ABNORMAL LOW (ref 12.0–15.0)
MCH: 29.2 pg (ref 26.0–34.0)
MCH: 29.9 pg (ref 26.0–34.0)
MCHC: 31.7 g/dL (ref 30.0–36.0)
MCHC: 32.8 g/dL (ref 30.0–36.0)
MCV: 92.1 fL (ref 80.0–100.0)
MCV: 91 fL (ref 80.0–100.0)
Platelets: 176 10*3/uL (ref 150–400)
Platelets: 156 10*3/uL (ref 150–400)
RBC: 3.53 MIL/uL — ABNORMAL LOW (ref 3.87–5.11)
RBC: 3.65 MIL/uL — ABNORMAL LOW (ref 3.87–5.11)
RDW: 17 % — ABNORMAL HIGH (ref 11.5–15.5)
RDW: 16.5 % — ABNORMAL HIGH (ref 11.5–15.5)
WBC: 9.3 10*3/uL (ref 4.0–10.5)
WBC: 10.8 10*3/uL — ABNORMAL HIGH (ref 4.0–10.5)
nRBC: 0 % (ref 0.0–0.2)
nRBC: 0 % (ref 0.0–0.2)

## 2023-01-02 LAB — TYPE AND SCREEN

## 2023-01-02 LAB — BPAM RBC: Blood Product Expiration Date: 202407112359

## 2023-01-02 LAB — HEPARIN LEVEL (UNFRACTIONATED): Heparin Unfractionated: 0.52 IU/mL (ref 0.30–0.70)

## 2023-01-02 LAB — ABO/RH: ABO/RH(D): A POS

## 2023-01-02 LAB — PREGNANCY, URINE: Preg Test, Ur: NEGATIVE

## 2023-01-02 LAB — PREPARE RBC (CROSSMATCH)

## 2023-01-02 SURGERY — PERIPHERAL VASCULAR THROMBECTOMY
Anesthesia: LOCAL

## 2023-01-02 MED ORDER — HEPARIN SODIUM (PORCINE) 1000 UNIT/ML IJ SOLN
INTRAMUSCULAR | Status: DC | PRN
  Administered 2023-01-02 (×2): 5000 [IU] via INTRAVENOUS

## 2023-01-02 MED ORDER — HEPARIN (PORCINE) 25000 UT/250ML-% IV SOLN
950.0000 [IU]/h | INTRAVENOUS | Status: AC
  Administered 2023-01-03: 950 [IU]/h via INTRAVENOUS
  Filled 2023-01-02 (×2): qty 250

## 2023-01-02 MED ORDER — HEPARIN (PORCINE) IN NACL 1000-0.9 UT/500ML-% IV SOLN
INTRAVENOUS | Status: DC | PRN
  Administered 2023-01-02: 500 mL

## 2023-01-02 MED ORDER — SODIUM CHLORIDE 0.9% IV SOLUTION: Freq: Once | INTRAVENOUS | Status: DC

## 2023-01-02 MED ORDER — LIDOCAINE HCL (PF) 1 % IJ SOLN
INTRAMUSCULAR | Status: DC | PRN
  Administered 2023-01-02 (×2): 2 mL
  Administered 2023-01-02: 15 mL

## 2023-01-02 MED ORDER — SODIUM CHLORIDE 0.9 % IV SOLN: INTRAVENOUS | Status: DC

## 2023-01-02 MED ORDER — ACETAMINOPHEN 325 MG PO TABS
650.0000 mg | ORAL_TABLET | ORAL | Status: DC | PRN
  Administered 2023-01-05: 650 mg via ORAL
  Filled 2023-01-02: qty 2

## 2023-01-02 MED ORDER — LIDOCAINE HCL (PF) 1 % IJ SOLN
INTRAMUSCULAR | Status: AC
  Filled 2023-01-02: qty 30

## 2023-01-02 MED ORDER — LABETALOL HCL 5 MG/ML IV SOLN: 10.0000 mg | INTRAVENOUS | Status: DC | PRN

## 2023-01-02 MED ORDER — DIPHENHYDRAMINE HCL 50 MG/ML IJ SOLN
25.0000 mg | Freq: Once | INTRAMUSCULAR | Status: AC
  Administered 2023-01-02: 25 mg via INTRAVENOUS
  Filled 2023-01-02: qty 1

## 2023-01-02 MED ORDER — SODIUM CHLORIDE 0.9 % IV BOLUS
INTRAVENOUS | Status: AC | PRN
  Administered 2023-01-02: 500 mL via INTRAVENOUS

## 2023-01-02 MED ORDER — METHYLPREDNISOLONE SODIUM SUCC 125 MG IJ SOLR
125.0000 mg | Freq: Once | INTRAMUSCULAR | Status: AC
  Administered 2023-01-02: 125 mg via INTRAVENOUS
  Filled 2023-01-02: qty 2

## 2023-01-02 MED ORDER — HYDRALAZINE HCL 20 MG/ML IJ SOLN: 5.0000 mg | INTRAMUSCULAR | Status: DC | PRN

## 2023-01-02 MED ORDER — SODIUM CHLORIDE 0.9 % IV SOLN: 250.0000 mL | INTRAVENOUS | Status: DC | PRN

## 2023-01-02 MED ORDER — MIDAZOLAM HCL 2 MG/2ML IJ SOLN
INTRAMUSCULAR | Status: AC
  Filled 2023-01-02: qty 2

## 2023-01-02 MED ORDER — FENTANYL CITRATE (PF) 100 MCG/2ML IJ SOLN
INTRAMUSCULAR | Status: AC
  Filled 2023-01-02: qty 2

## 2023-01-02 MED ORDER — MIDAZOLAM HCL 2 MG/2ML IJ SOLN
INTRAMUSCULAR | Status: DC | PRN
  Administered 2023-01-02: 1 mg via INTRAVENOUS
  Administered 2023-01-02: 2 mg via INTRAVENOUS

## 2023-01-02 MED ORDER — FENTANYL CITRATE (PF) 100 MCG/2ML IJ SOLN
INTRAMUSCULAR | Status: DC | PRN
  Administered 2023-01-02 (×4): 50 ug via INTRAVENOUS

## 2023-01-02 MED ORDER — ONDANSETRON HCL 4 MG/2ML IJ SOLN: 4.0000 mg | Freq: Four times a day (QID) | INTRAMUSCULAR | Status: DC | PRN

## 2023-01-02 MED ORDER — SODIUM CHLORIDE 0.9% FLUSH: 3.0000 mL | INTRAVENOUS | Status: DC | PRN

## 2023-01-02 MED ORDER — SODIUM CHLORIDE 0.9% FLUSH
3.0000 mL | Freq: Two times a day (BID) | INTRAVENOUS | Status: DC
  Administered 2023-01-04 – 2023-01-05 (×3): 3 mL via INTRAVENOUS

## 2023-01-02 MED ORDER — SODIUM CHLORIDE 0.9 % WEIGHT BASED INFUSION
1.0000 mL/kg/h | INTRAVENOUS | Status: AC
  Administered 2023-01-02: 1 mL/kg/h via INTRAVENOUS

## 2023-01-02 SURGICAL SUPPLY — 21 items
BALLN ATLAS 14X40X75 (BALLOONS) ×1
BALLOON ATLAS 14X40X75 (BALLOONS) IMPLANT
CATH INFUS 135CMX30CM (CATHETERS) IMPLANT
CATH INFUS 135CMX50CM (CATHETERS) IMPLANT
CATH LIGHTNI FLASH 16XTORQ 100 (CATHETERS) IMPLANT
CATH LIGHTNING FLASH XTORQ 100 (CATHETERS) ×1
CATH QUICKCROSS SUPP .035X90CM (MICROCATHETER) IMPLANT
CATH VISIONS PV .035 IVUS (CATHETERS) IMPLANT
DEVICE INFLATION ATRION QL38 (MISCELLANEOUS) IMPLANT
GLIDEWIRE ADV .035X260CM (WIRE) IMPLANT
GUIDEWIRE SAFE TJ AMPLATZ EXST (WIRE) IMPLANT
KIT MICROPUNCTURE NIT STIFF (SHEATH) IMPLANT
PROTECTION STATION PRESSURIZED (MISCELLANEOUS) ×1
SHEATH DRYSEAL FLEX 12FR 33CM (SHEATH) IMPLANT
SHEATH DRYSEAL FLEX 16FR 33CM (SHEATH) IMPLANT
SHEATH PINNACLE 5F 10CM (SHEATH) IMPLANT
SHEATH PINNACLE 8F 10CM (SHEATH) IMPLANT
SHEATH PROBE COVER 6X72 (BAG) IMPLANT
STATION PROTECTION PRESSURIZED (MISCELLANEOUS) IMPLANT
TRAY PV CATH (CUSTOM PROCEDURE TRAY) IMPLANT
WIRE TORQFLEX AUST .018X40CM (WIRE) IMPLANT

## 2023-01-02 NOTE — Op Note (Signed)
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  Ultrasound-guided micropuncture access of the left popliteal vein 2.  Left lower extremity venogram 3.  Ultrasound-guided micropuncture access of the left greater saphenous vein 4.  Left lower extremity venogram 5.  Ultrasound-guided micropuncture access of the left femoral vein 6.  Ilio cavogram Intravascular ultrasound of the femoral vein, common femoral vein, external iliac vein, common iliac vein, inferior vena cava 7.  Percutaneous mechanical thrombectomy using 16 French penumbra to the previously placed iliofemoral stents 8.  Balloon venoplasty 4 x 40 Atlas to iliac system and common femoral vein   Indications:   Patient is a 37 year old female with history of outside stenting in [place] were May Thurner.  Stents were extended to the common femoral artery.  She initially presented to our emergency department with the stents thrombosed.  She has undergone multiple percutaneous mechanical thrombectomy procedures, lysis, restenting the proximal aspect.  She presents after being off her anticoagulation for a number of days awaiting tooth extraction.  She has had left lower extremity swelling for nearly 5 days.  After discussing the risk and benefits of left lower extremity venogram, ilio cavogram, percutaneous mechanical thrombectomy of occluded iliac stents, Amber Carroll elected to proceed.  Findings:  Patent popliteal vein, occluded distal femoral vein which reconstitutes more central.  Numerous collaterals from the popliteal vein to the deep femoral vein, femoral vein.  Common femoral vein with stent, extending into the inferior vena cava.  All of this is thrombosed.  Patent inferior vena cava.   Procedure:   The patient is  brought to the OR laid in the prone position.  She was prepped and draped in standard fashion and a timeout was performed.  The case began with moderate sedation using fentanyl and Versed.  Following this, an ultrasound-guided micropuncture needle was used to access the popliteal vein.  A wire was run, however stopped abruptly venogram demonstrated occlusion of the femoral vein with reconstitution closer to the common femoral vein.  I was able to use a wire to select a large branch that connected to the deep femoral system.  The wire was advanced through the stent and into the inferior vena cava.  Unfortunately, this branch was very small.  I do not think I would be able to pass a 16 [place] sheath nor a 12 French sheath as a wire and catheter had difficulty.  At this point, I considered lysis.  Prior to lysis, I looked at the greater saphenous vein with an ultrasound and elected to access it percutaneously using a micropuncture needle.  Venogram from the greater saphenous vein demonstrated that it entered the common femoral vein had an area that had been stented, therefore it could not be used as a conduit.  I elected to place a lytic catheter through the popliteal vein as a placeholder in case I could not come from the femoral vein.  I used a 30 cm lytic catheter, which was held in place with standard Tegaderms and 4 x 4 dressing.  Next, the patient was rolled supine, and reprepped.  In the supine position, the left femoral vein was located and percutaneously accessed using a micropuncture needle under ultrasound guidance.  A wire was run into the common femoral artery and through the entirety of the iliac system, which was stented.  The wire  had a nice J, therefore I was not worried that it had crossed into any stent intereses.  I then used IVUS to assess the clot burden within the stents.  There did not appear to be mechanical reason for the stent failure.  Therefore I am moved to percutaneous mechanical  thrombectomy.  A 16 French Gore dry seal sheath was placed in the femoral vein, followed by the 16 French penumbra device.  Several passes were made with the 16 French penumbra device and a large load of thrombus was removed.  EBL during this portion of the procedure was roughly 500 mL to 600 mL.  Follow-up venogram demonstrated an excellent flow lumen, however there was residual clot.  I elected to use balloon venoplasty, rather than further suction, as the patient had a significant pressure drop, requiring fluid bolus.  She recovered nicely from this.  A 14 x 40 mm Atlas balloon was used to balloon venoplasty of the previously placed stents.  Follow-up IVUS demonstrated a widely patent flow lumen.  This was confirmed with venography.  At this point, I was happy with the result.  The femoral vein access was closed using Monocryl suture, the patient was then rolled in the lytic catheter removed-which was not used-and the 8 French sheath was removed and skin closed using a Monocryl suture.   Impression: Successful recanalization of previously thrombosed iliac stents using 16 French penumbra from femoral vein access.     Amber Olden, MD [facility] [place] Office: [phone]

## 2023-01-02 NOTE — Progress Notes (Signed)
ANTICOAGULATION CONSULT NOTE - Follow-up Consult  Pharmacy Consult for heparin infusion Indication: DVT  Allergies  Allergen Reactions   Coconut (Cocos Nucifera) Anaphylaxis   Ivp Dye [Iodinated Contrast Media] Anaphylaxis and Hives   Latex Anaphylaxis   Tessalon [Benzonatate] Anaphylaxis   Yellow Dye Anaphylaxis    Other Reaction(s): Not available    Patient Measurements: Height: 5\' 3"  (160 cm) Weight: 52.2 kg (115 lb) IBW/kg (Calculated) : 52.4 Heparin Dosing Weight: 52.2 kg  Vital Signs: Temp: 98.3 F (36.8 C) (07/03 0726) Temp Source: Oral (07/03 0254) BP: 106/63 (07/03 0726) Pulse Rate: 55 (07/03 0726)  Labs: Recent Labs    12/31/22 1322 12/31/22 2225 12/31/22 2225 01/01/23 0443 01/01/23 1335 01/02/23 0449  HGB 12.0  --   --  10.9*  --  10.3*  HCT 38.4  --   --  33.5*  --  32.5*  PLT 197  --   --  180  --  176  APTT  --  26  --   --   --   --   HEPARINUNFRC  --  <0.10*   < > 0.36 0.62 0.52  CREATININE 0.71  --   --   --   --   --    < > = values in this interval not displayed.     Estimated Creatinine Clearance: 80.1 mL/min (by C-G formula based on SCr of 0.71 mg/dL).   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   Past Month   ALPRAZolam (XANAX) 0.5 MG tablet Take 0.5 mg by mouth 2 (two) times daily as needed for anxiety or sleep.   Past Month   amoxicillin (AMOXIL) 500 MG capsule Take 500 mg by mouth 3 (three) times daily.   12/31/2022   aspirin EC 81 MG tablet Take 1 tablet (81 mg total) by mouth daily. Swallow whole. 30 tablet 12 12/30/2022   dabigatran (PRADAXA) 150 MG CAPS capsule  Take 1 capsule (150 mg total) by mouth 2 (two) times daily. 60 capsule 6 12/31/2022   levocetirizine (XYZAL) 5 MG tablet Take 5 mg by mouth daily.   12/31/2022   montelukast (SINGULAIR) 10 MG tablet Take 10 mg by mouth daily.   12/31/2022   acetaminophen (TYLENOL) 325 MG tablet Take 2 tablets (650 mg total) by mouth every 4 (four) hours as needed for headache or mild pain. (Patient not taking: Reported on 12/31/2022)   Not Taking   cetirizine (ZYRTEC ALLERGY) 10 MG tablet Take 1 tablet (10 mg total) by mouth daily. (Patient not taking: Reported on 12/31/2022) 30 tablet 1 Not Taking   ferrous sulfate 325 (65 FE) MG EC tablet Take 1 tablet (325 mg total) by mouth daily with breakfast. (Patient not taking: Reported on 12/31/2022) 30 tablet 3 Not Taking   Assessment: 37 yo F who presents with complete occlusion of L ileocaval venous stent extending from common femoral vein into the inferior IVC and occluded L iliac stent. 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   Pt reports being instructed to stop taking her dabigatran and aspirin on 6/13  for planned dental surgery. Dental surgery was delayed and patient was instructed to resume dabigatran and aspirin on 6/19 PM and took doses on 6/19 and 6/20, then was instructed to stop dabigatran and aspirin again on 6/21 in anticipation of dental surgery rescheduled for 6/26. Pt underwent tooth extraction on 6/26 AM and resumed dabigatran and aspirin on 6/26 PM. Patient has been taking dabigatran and aspirin as directed since 6/26 PM. Last dose of dabigatran was at 05:00 on 12/31/22. Patient reports left leg pain started on Friday, 12/28/22.  Hgb down slightly, Plts stable.  No s/sx of bleeding. Pt reports oral surgery is healing and no bleeding issues noted post-op.  Heparin level this AM is therapeutic at 0.52 (decreased from yesterday's level). Percutaneous mechanical thrombectomy scheduled for today.  Goal of Therapy:  Heparin level  0.3-0.7 units/ml Monitor platelets by anticoagulation protocol: Yes   Plan:  Continue heparin infusion to 950u/hr.   Monitor daily CBC, heparin level, and for s/sx of bleeding F/u vascular surgery for further recommendations and plans for anticoagulation  Nicole Kindred, PharmD 01/02/23 0800  Please refer to [facility] for pharmacy phone number

## 2023-01-02 NOTE — Progress Notes (Addendum)
  Progress Note    01/02/2023 7:55 AM * No surgery date entered *  Subjective:  no complaints   Vitals:   01/02/23 0254 01/02/23 0726  BP: (!) 97/59 106/63  Pulse: 61 (!) 55  Resp: 18 18  Temp: 98.3 F (36.8 C) 98.3 F (36.8 C)  SpO2: 100% 99%   Physical Exam: Lungs:  non labored Extremities:  L leg edema compared to R Neurologic: A&O  CBC    Component Value Date/Time   WBC 9.3 01/02/2023 0449   RBC 3.53 (L) 01/02/2023 0449   HGB 10.3 (L) 01/02/2023 0449   HGB 10.2 (L) 10/16/2022 0925   HGB 12.5 08/26/2009 1213   HCT 32.5 (L) 01/02/2023 0449   HCT 38.7 08/26/2009 1213   PLT 176 01/02/2023 0449   PLT 233 10/16/2022 0925   PLT 184 08/26/2009 1213   MCV 92.1 01/02/2023 0449   MCV 88.8 08/26/2009 1213   MCH 29.2 01/02/2023 0449   MCHC 31.7 01/02/2023 0449   RDW 17.0 (H) 01/02/2023 0449   RDW 14.5 08/26/2009 1213   LYMPHSABS 1.6 10/16/2022 0925   LYMPHSABS 1.5 08/26/2009 1213   MONOABS 0.4 10/16/2022 0925   MONOABS 0.3 08/26/2009 1213   EOSABS 0.2 10/16/2022 0925   EOSABS 0.2 08/26/2009 1213   BASOSABS 0.0 10/16/2022 0925   BASOSABS 0.0 08/26/2009 1213    BMET    Component Value Date/Time   NA 137 12/31/2022 1322   K 4.3 12/31/2022 1322   CL 104 12/31/2022 1322   CO2 22 12/31/2022 1322   GLUCOSE 86 12/31/2022 1322   BUN 13 12/31/2022 1322   CREATININE 0.71 12/31/2022 1322   CREATININE 0.79 10/16/2022 0925   CALCIUM 8.8 (L) 12/31/2022 1322   GFRNONAA >60 12/31/2022 1322   GFRNONAA >60 10/16/2022 0925   GFRAA  02/07/2009 2314    >60        The eGFR has been calculated using the MDRD equation. This calculation has not been validated in all clinical situations. eGFR's persistently <60 mL/min signify possible Chronic Kidney Disease.    INR    Component Value Date/Time   INR 1.6 (H) 10/16/2022 0925   INR 1.00 (L) 08/26/2009 1213   INR 1.6 05/17/2009 1452     Intake/Output Summary (Last 24 hours) at 01/02/2023 0755 Last data filed at 01/02/2023  0402 Gross per 24 hour  Intake 270.44 ml  Output --  Net 270.44 ml     Assessment/Plan:  37 y.o. female with thrombosed L iliac vein stent  Plan is for percutaneous mechanical thrombectomy of the left common iliac vein stent today in the cath lab with Dr. Karin Lieu.  All questions were answered.  Consent ordered.  Continue npo. Continue heparin    Emilie Rutter, PA-C [facility] [phone] 01/02/2023 7:55 AM  VASCULAR STAFF ADDENDUM: I have independently interviewed and examined the patient. I agree with the above.  Plan for left leg venography with percutaneous mechanical thrombectomy.   Fara Olden, MD [facility] Phone Number: [phone] 01/02/2023 2:04 PM

## 2023-01-02 NOTE — Progress Notes (Signed)
ANTICOAGULATION CONSULT NOTE - Follow-up Consult  Pharmacy Consult for heparin infusion Indication: DVT  Allergies  Allergen Reactions   Coconut (Cocos Nucifera) Anaphylaxis   Ivp Dye [Iodinated Contrast Media] Anaphylaxis and Hives   Latex Anaphylaxis   Tessalon [Benzonatate] Anaphylaxis   Yellow Dye Anaphylaxis    Other Reaction(s): Not available    Patient Measurements: Height: 5\' 3"  (160 cm) Weight: 52.2 kg (115 lb) IBW/kg (Calculated) : 52.4 Heparin Dosing Weight: 52.2 kg  Vital Signs: Temp: 98.3 F (36.8 C) (07/03 0726) BP: 97/59 (07/03 1745) Pulse Rate: 64 (07/03 1745)  Labs: Recent Labs    12/31/22 1322 12/31/22 2225 12/31/22 2225 01/01/23 0443 01/01/23 1335 01/02/23 0449  HGB 12.0  --   --  10.9*  --  10.3*  HCT 38.4  --   --  33.5*  --  32.5*  PLT 197  --   --  180  --  176  APTT  --  26  --   --   --   --   HEPARINUNFRC  --  <0.10*   < > 0.36 0.62 0.52  CREATININE 0.71  --   --   --   --   --    < > = values in this interval not displayed.     Estimated Creatinine Clearance: 80.1 mL/min (by C-G formula based on SCr of 0.71 mg/dL).   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   Past Month   ALPRAZolam (XANAX) 0.5 MG tablet Take 0.5 mg by mouth 2 (two) times daily as needed for anxiety or sleep.   Past Month   amoxicillin (AMOXIL) 500 MG capsule Take 500 mg by mouth 3 (three) times daily.   12/31/2022   aspirin EC 81 MG tablet Take 1 tablet (81 mg total) by mouth daily. Swallow whole. 30 tablet 12 12/30/2022   dabigatran (PRADAXA) 150 MG CAPS capsule Take 1 capsule (150 mg total) by  mouth 2 (two) times daily. 60 capsule 6 12/31/2022   levocetirizine (XYZAL) 5 MG tablet Take 5 mg by mouth daily.   12/31/2022   montelukast (SINGULAIR) 10 MG tablet Take 10 mg by mouth daily.   12/31/2022   acetaminophen (TYLENOL) 325 MG tablet Take 2 tablets (650 mg total) by mouth every 4 (four) hours as needed for headache or mild pain. (Patient not taking: Reported on 12/31/2022)   Not Taking   cetirizine (ZYRTEC ALLERGY) 10 MG tablet Take 1 tablet (10 mg total) by mouth daily. (Patient not taking: Reported on 12/31/2022) 30 tablet 1 Not Taking   ferrous sulfate 325 (65 FE) MG EC tablet Take 1 tablet (325 mg total) by mouth daily with breakfast. (Patient not taking: Reported on 12/31/2022) 30 tablet 3 Not Taking   Assessment: 26 yoF presents with complete occlusion of L ileocaval venous stent extending from common femoral vein into inferior IVC and occluded L iliac stent. 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 Last dose of dabigatran 7/1 AM PTA. Pharmacy consulted to  transition to heparin.  PM update - S/p percutaneous mechanical thrombectomy of the left common iliac vein stent 7/3 with Vascular Surgery. Pharmacy consulted to resume heparin 8 hrs post-sheath removal (removed at 1639 per cath procedure log). Heparin previously therapeutic on 950 units/hr. CBC stable. No bleed issues reported.  Goal of Therapy:  Heparin level 0.3-0.7 units/ml Monitor platelets by anticoagulation protocol: Yes   Plan:  Resume heparin infusion at previous therapeutic rate 950 units/hr on 7/4 at 0045 (8hrs post-sheath removal) Check heparin level 6hr from resumption   Monitor daily CBC, s/sx of bleeding F/u Vascular Surgery plans   Leia Alf, PharmD, BCPS Please check AMION for all [facility] contact numbers Clinical Pharmacist 01/02/2023 5:50 PM

## 2023-01-03 DIAGNOSIS — I8290 Acute embolism and thrombosis of unspecified vein: Secondary | ICD-10-CM | POA: Diagnosis not present

## 2023-01-03 LAB — BPAM RBC: Unit Type and Rh: 9500

## 2023-01-03 LAB — HEPARIN LEVEL (UNFRACTIONATED)
Heparin Unfractionated: 0.46 IU/mL (ref 0.30–0.70)
Heparin Unfractionated: 0.52 IU/mL (ref 0.30–0.70)

## 2023-01-03 LAB — CBC
HCT: 33.7 % — ABNORMAL LOW (ref 36.0–46.0)
Hemoglobin: 11 g/dL — ABNORMAL LOW (ref 12.0–15.0)
MCH: 29.5 pg (ref 26.0–34.0)
MCHC: 32.6 g/dL (ref 30.0–36.0)
MCV: 90.3 fL (ref 80.0–100.0)
Platelets: 139 10*3/uL — ABNORMAL LOW (ref 150–400)
RBC: 3.73 MIL/uL — ABNORMAL LOW (ref 3.87–5.11)
RDW: 16.9 % — ABNORMAL HIGH (ref 11.5–15.5)
WBC: 10.3 10*3/uL (ref 4.0–10.5)
nRBC: 0 % (ref 0.0–0.2)

## 2023-01-03 LAB — TYPE AND SCREEN

## 2023-01-03 MED ORDER — DABIGATRAN ETEXILATE MESYLATE 150 MG PO CAPS
150.0000 mg | ORAL_CAPSULE | Freq: Two times a day (BID) | ORAL | Status: DC
  Administered 2023-01-04 – 2023-01-05 (×3): 150 mg via ORAL
  Filled 2023-01-03 (×4): qty 1

## 2023-01-03 MED ORDER — DIPHENHYDRAMINE HCL 25 MG PO CAPS
25.0000 mg | ORAL_CAPSULE | Freq: Once | ORAL | Status: AC
  Administered 2023-01-03: 25 mg via ORAL
  Filled 2023-01-03: qty 1

## 2023-01-03 NOTE — Progress Notes (Addendum)
Progress Note    01/03/2023 10:00 AM 1 Day Post-Op  Subjective:  sitting up in chair, just finished breakfast. Left leg sore and swollen   Vitals:   01/03/23 0709 01/03/23 0900  BP: 99/64 (!) 94/56  Pulse: (!) 56 81  Resp: 15 17  Temp: 98.2 F (36.8 C)   SpO2: 99% 99%   Physical Exam: Cardiac:  regular Lungs:  non labored Incisions: left leg access sites c/d/I, mild ecchymosis present Extremities:  well perfused and warm with palpable DP Neurologic: alert and oriented  CBC    Component Value Date/Time   WBC 10.3 01/03/2023 0040   RBC 3.73 (L) 01/03/2023 0040   HGB 11.0 (L) 01/03/2023 0040   HGB 10.2 (L) 10/16/2022 0925   HGB 12.5 08/26/2009 1213   HCT 33.7 (L) 01/03/2023 0040   HCT 38.7 08/26/2009 1213   PLT 139 (L) 01/03/2023 0040   PLT 233 10/16/2022 0925   PLT 184 08/26/2009 1213   MCV 90.3 01/03/2023 0040   MCV 88.8 08/26/2009 1213   MCH 29.5 01/03/2023 0040   MCHC 32.6 01/03/2023 0040   RDW 16.9 (H) 01/03/2023 0040   RDW 14.5 08/26/2009 1213   LYMPHSABS 1.6 10/16/2022 0925   LYMPHSABS 1.5 08/26/2009 1213   MONOABS 0.4 10/16/2022 0925   MONOABS 0.3 08/26/2009 1213   EOSABS 0.2 10/16/2022 0925   EOSABS 0.2 08/26/2009 1213   BASOSABS 0.0 10/16/2022 0925   BASOSABS 0.0 08/26/2009 1213    BMET    Component Value Date/Time   NA 137 12/31/2022 1322   K 4.3 12/31/2022 1322   CL 104 12/31/2022 1322   CO2 22 12/31/2022 1322   GLUCOSE 86 12/31/2022 1322   BUN 13 12/31/2022 1322   CREATININE 0.71 12/31/2022 1322   CREATININE 0.79 10/16/2022 0925   CALCIUM 8.8 (L) 12/31/2022 1322   GFRNONAA >60 12/31/2022 1322   GFRNONAA >60 10/16/2022 0925   GFRAA  02/07/2009 2314    >60        The eGFR has been calculated using the MDRD equation. This calculation has not been validated in all clinical situations. eGFR's persistently <60 mL/min signify possible Chronic Kidney Disease.    INR    Component Value Date/Time   INR 1.6 (H) 10/16/2022 0925   INR  1.00 (L) 08/26/2009 1213   INR 1.6 05/17/2009 1452     Intake/Output Summary (Last 24 hours) at 01/03/2023 1000 Last data filed at 01/03/2023 0710 Gross per 24 hour  Intake 1176.43 ml  Output 900 ml  Net 276.43 ml     Assessment/Plan:  37 y.o. female is s/p  Ilio cavogram Intravascular ultrasound of the femoral vein, common femoral vein, external iliac vein, common iliac vein, inferior vena cava with Percutaneous mechanical thrombectomy using 16 French penumbra to the previously placed iliofemoral stents 1 Day Post-Op   Good result from mechanical thrombectomy of thrombosed left iliac stents Dressings can be removed later today Order placed for LLE thigh high compression stocking Resume Pradaxa and Aspirin. Do not think this was related to failure of medication but from her stopping her anticoagulation for dental procedure Discussed importance of smoking cessation with her Okay for discharge from vascular standpoint She will follow up in our office in 4-6 weeks with LLE aorto/iliac/ ivc and LLE venous duplex   Graceann Congress, PA-C [facility] [phone] 01/03/2023 10:00 AM   I have interviewed and examined patient with PA and agree with assessment and plan above.  Okay for  Pradaxa and aspirin and compression stockings will be fitted and can discharge home today.  She will follow-up with [place] in 4 to 6 weeks ultimately may need to transition her care closer to the [facility].  I discussed the importance of smoking cessation.  Dellar Traber C. Randie Heinz, MD [facility] [place] Office: [phone] Pager: [phone]

## 2023-01-03 NOTE — Progress Notes (Signed)
  PROGRESS NOTE  Amber Carroll ZOX:[id number] DOB: 1985-09-03 DOA: 12/31/2022 PCP: Audery Amel, MD  Brief History   Patient is a 37 year old female with history of May Thurner syndrome, recurrent PE on Pradaxa ,left iliac stenting, who presented to the emergency departmeant with complaint of leg swelling.  She recently had dental procedure and held her Pradaxa/aspirin for 4 days.  She was seen in the emergency department at [facility] due to calf pain, swelling.  Case was discussed with vascular surgery at [facility] and she was advised to come here.    She was afebrile, hemodynamically stable.  Lab work showed leukocytosis of 11.  CT venogram showed complete occlusion of left iliac Venous Stent.  Vascular Surgery Consulted, Started on Heparin Drip.   Plan is for the patient to go for thrombectomy today with vascular surgery.  01/03/2023: Patient has undergone thrombectomy by the vascular surgery team.  Continue heparin drip.  Transition to Pradaxa and aspirin.  Likely discharge tomorrow.  Subjective  Patient seen alongside patient's father and stepmom.  No new complaints.  Objective   Vitals:  Vitals:   01/03/23 0900 01/03/23 1103  BP: (!) 94/56 (!) 91/52  Pulse: 81 63  Resp: 17 14  Temp:  97.9 F (36.6 C)  SpO2: 99% 99%    Exam:  Constitutional:  The patient is awake, alert, and oriented x 3. Mild distress from leg pain. Respiratory:  Clear to auscultation. Cardiovascular:  S1-S2. Abdomen:  Abdomen is soft, non-tender.   Neurologic:  Awake and alert.  Patient moves all extremities.    Scheduled Meds:  sodium chloride   Intravenous Once   aspirin EC  81 mg Oral Daily   midodrine  5 mg Oral TID WC   sodium chloride flush  3 mL Intravenous Q12H   Continuous Infusions:  sodium chloride     heparin 950 Units/hr (01/03/23 0120)    Principal Problem:   VTE (venous thromboembolism)   A & P  Complete Occlusion of left iliac Venous Stent in the setting of May Thurner  Syndrome: The patient is on a heparin drip. Vascular surgery has been consulted and the plan is for the patient to go for thrombectomy later today. The patient underwent a dental procedure that required her to be off of her pradaxa for 4 days recently. She is also on dual anti-platelet therapy due to previous stenting.  Left leg pain: The patient is receivign oxycodone for pain.  I have seen and examined this patient myself. I have spent 34 minutes in er evaluation and care.  DVT prophylaxis: Heparin gtt Code Status: Full Code Family Communication: Mother at bedside Disposition Plan: Anticipate discharge to home    Berton Mount MD [facility] Direct contact: see [email or web address]  7PM-7AM contact night coverage as above 01/02/2023, 7:15 PM  LOS: 3 days   Consultants  Vascular Surgery  Procedures  None  Antibiotics  None   LOS: 3 days

## 2023-01-03 NOTE — Progress Notes (Signed)
LLL measurements, L ankle 8", L calf 13", L thigh 18".

## 2023-01-03 NOTE — Progress Notes (Signed)
ANTICOAGULATION CONSULT NOTE - Follow-up Consult  Pharmacy Consult for heparin infusion Indication: DVT  Allergies  Allergen Reactions   Coconut (Cocos Nucifera) Anaphylaxis   Ivp Dye [Iodinated Contrast Media] Anaphylaxis and Hives   Latex Anaphylaxis   Tessalon [Benzonatate] Anaphylaxis   Yellow Dye Anaphylaxis    Other Reaction(s): Not available    Patient Measurements: Height: 5\' 3"  (160 cm) Weight: 52.2 kg (115 lb) IBW/kg (Calculated) : 52.4 Heparin Dosing Weight: 52.2 kg  Vital Signs: Temp: 97.9 F (36.6 C) (07/04 1103) Temp Source: Oral (07/04 1103) BP: 91/52 (07/04 1103) Pulse Rate: 63 (07/04 1103)  Labs: Recent Labs    12/31/22 1322 12/31/22 2225 01/01/23 0443 01/02/23 0449 01/02/23 2139 01/03/23 0040 01/03/23 0638 01/03/23 1127  HGB 12.0  --    < > 10.3* 10.9* 11.0*  --   --   HCT 38.4  --    < > 32.5* 33.2* 33.7*  --   --   PLT 197  --    < > 176 156 139*  --   --   APTT  --  26  --   --   --   --   --   --   HEPARINUNFRC  --  <0.10*   < > 0.52  --   --  0.46 0.52  CREATININE 0.71  --   --   --   --   --   --   --    < > = values in this interval not displayed.     Estimated Creatinine Clearance: 80.1 mL/min (by C-G formula based on SCr of 0.71 mg/dL).   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   Past Month   ALPRAZolam (XANAX) 0.5 MG tablet Take 0.5 mg by mouth 2 (two) times daily as needed for anxiety or sleep.   Past Month   amoxicillin (AMOXIL) 500 MG capsule Take 500 mg by mouth 3 (three) times daily.   12/31/2022   aspirin EC 81 MG tablet Take 1 tablet (81 mg total) by  mouth daily. Swallow whole. 30 tablet 12 12/30/2022   dabigatran (PRADAXA) 150 MG CAPS capsule Take 1 capsule (150 mg total) by mouth 2 (two) times daily. 60 capsule 6 12/31/2022   levocetirizine (XYZAL) 5 MG tablet Take 5 mg by mouth daily.   12/31/2022   montelukast (SINGULAIR) 10 MG tablet Take 10 mg by mouth daily.   12/31/2022   acetaminophen (TYLENOL) 325 MG tablet Take 2 tablets (650 mg total) by mouth every 4 (four) hours as needed for headache or mild pain. (Patient not taking: Reported on 12/31/2022)   Not Taking   cetirizine (ZYRTEC ALLERGY) 10 MG tablet Take 1 tablet (10 mg total) by mouth daily. (Patient not taking: Reported on 12/31/2022) 30 tablet 1 Not Taking   ferrous sulfate 325 (65 FE) MG EC tablet Take 1 tablet (325 mg total) by mouth daily with breakfast. (Patient not taking: Reported on 12/31/2022) 30 tablet 3 Not Taking   Assessment: 21 yoF presents with complete occlusion of L ileocaval venous stent extending from common femoral vein into inferior IVC  and occluded L iliac stent. 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 Last dose of dabigatran 7/1 AM PTA. Pharmacy consulted to transition to heparin.  PM update - S/p percutaneous mechanical thrombectomy of the left common iliac vein stent 7/3 with Vascular Surgery. Pharmacy consulted to resume heparin 8 hrs post-sheath removal (removed at 1639 per cath procedure log). Heparin previously therapeutic on 950 units/hr. CBC stable. No bleed issues reported.  7/4 AM update: HL 0.46 Hgb 11 PLT 139K No signs of bleeding or issues with hep gtt  HL at 11:50 (confirmatory level) 0.52- therapeutic  Goal of Therapy:  Heparin level 0.3-0.7 units/ml Monitor platelets by anticoagulation protocol: Yes   Plan:  Continue heparin infusion at 950 units/hr Check HL daily thereafter Monitor daily CBC, s/sx of bleeding F/u  Vascular Surgery plans   Greta Doom BS, PharmD, BCPS Clinical Pharmacist 01/03/2023 12:22 PM  Contact: [phone] after 3 PM  "Be curious, not judgmental..." -Debbora Dus

## 2023-01-03 NOTE — Progress Notes (Signed)
ANTICOAGULATION CONSULT NOTE - Follow-up Consult  Pharmacy Consult for heparin infusion ->Pradaxa Indication: DVT  Allergies  Allergen Reactions   Coconut (Cocos Nucifera) Anaphylaxis   Ivp Dye [Iodinated Contrast Media] Anaphylaxis and Hives   Latex Anaphylaxis   Tessalon [Benzonatate] Anaphylaxis   Yellow Dye Anaphylaxis    Not a food allergy    Patient Measurements: Height: 5\' 3"  (160 cm) Weight: 52.2 kg (115 lb) IBW/kg (Calculated) : 52.4 Heparin Dosing Weight: 52.2 kg  Vital Signs: Temp: 97.9 F (36.6 C) (07/04 1103) Temp Source: Oral (07/04 1103) BP: 91/52 (07/04 1103) Pulse Rate: 63 (07/04 1103)  Labs: Recent Labs    12/31/22 2225 01/01/23 0443 01/02/23 0449 01/02/23 2139 01/03/23 0040 01/03/23 0638 01/03/23 1127  HGB  --    < > 10.3* 10.9* 11.0*  --   --   HCT  --    < > 32.5* 33.2* 33.7*  --   --   PLT  --    < > 176 156 139*  --   --   APTT 26  --   --   --   --   --   --   HEPARINUNFRC <0.10*   < > 0.52  --   --  0.46 0.52   < > = values in this interval not displayed.     Estimated Creatinine Clearance: 80.1 mL/min (by C-G formula based on SCr of 0.71 mg/dL).   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   Past Month   ALPRAZolam (XANAX) 0.5 MG tablet Take 0.5 mg by mouth 2 (two) times daily as needed for anxiety or sleep.   Past Month   amoxicillin (AMOXIL) 500 MG capsule Take 500 mg by mouth 3 (three) times daily.   12/31/2022   aspirin EC 81 MG tablet Take 1 tablet (81 mg total) by mouth daily. Swallow whole. 30 tablet 12 12/30/2022   dabigatran (PRADAXA) 150 MG CAPS capsule  Take 1 capsule (150 mg total) by mouth 2 (two) times daily. 60 capsule 6 12/31/2022   levocetirizine (XYZAL) 5 MG tablet Take 5 mg by mouth daily.   12/31/2022   montelukast (SINGULAIR) 10 MG tablet Take 10 mg by mouth daily.   12/31/2022   acetaminophen (TYLENOL) 325 MG tablet Take 2 tablets (650 mg total) by mouth every 4 (four) hours as needed for headache or mild pain. (Patient not taking: Reported on 12/31/2022)   Not Taking   cetirizine (ZYRTEC ALLERGY) 10 MG tablet Take 1 tablet (10 mg total) by mouth daily. (Patient not taking: Reported on 12/31/2022) 30 tablet 1 Not Taking   ferrous sulfate 325 (65 FE) MG EC tablet Take 1 tablet (325 mg total) by mouth daily with breakfast. (Patient not taking: Reported on 12/31/2022) 30 tablet 3 Not Taking   Assessment: 13 yoF presents with complete occlusion of L ileocaval venous stent extending from common femoral vein into inferior IVC and occluded L iliac stent. 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 Last dose  of dabigatran 7/1 AM PTA. Pharmacy consulted to transition to heparin. S/p percutaneous mechanical thrombectomy of the left common iliac vein stent 7/3 with Vascular Surgery.   Pharmacy consulted 7/4 pm to restart Pradaxa tomorrow morning.  Goal of Therapy:  Heparin level 0.3-0.7 units/ml Monitor platelets by anticoagulation protocol: Yes   Plan:  Pradaxa 150mg  po BID starting 7/5 1000 Will d/c heparin infusion when first dose of pradaxa given.  Christoper Fabian, PharmD, BCPS Please see amion for complete clinical pharmacist phone list 01/03/2023 5:18 PM

## 2023-01-03 NOTE — Progress Notes (Addendum)
  PROGRESS NOTE  Aleen Sousa ZOX:[id number] DOB: [birth year] DOA: 12/31/2022 PCP: Audery Amel, MD  Brief History   Patient is a 37 year old female with history of May Thurner syndrome, recurrent PE on Pradaxa ,left iliac stenting, who presented to the emergency departmeant with complaint of leg swelling.  She recently had dental procedure and held her Pradaxa/aspirin for 4 days.  She was seen in the emergency department at [facility] due to calf pain, swelling.  Case was discussed with vascular surgery at [facility] and she was advised to come here.    She was afebrile, hemodynamically stable.  Lab work showed leukocytosis of 11.  CT venogram showed complete occlusion of left iliac Venous Stent.  Vascular Surgery Consulted, Started on Heparin Drip.   Plan is for the patient to go for thrombectomy today with vascular surgery.   Subjective  The patient is resting in bed. She is continuing to have pain in thehleft leg.  Objective   Vitals:  Vitals:   01/03/23 0500 01/03/23 0630  BP: (!) 91/50 (!) 92/54  Pulse: (!) 53 62  Resp: 14 15  Temp:  98 F (36.7 C)  SpO2: 97% 98%    Exam:  Constitutional:  The patient is awake, alert, and oriented x 3. Mild distress from leg pain. Respiratory:  No increased work of breathing. No wheezes, rales, or rhonchi No tactile fremitus Cardiovascular:  Regular rate and rhythm No murmurs, ectopy, or gallups. No lateral PMI. No thrills. Abdomen:  Abdomen is soft, non-tender, non-distended No hernias, masses, or organomegaly Normoactive bowel sounds.  Musculoskeletal:  No cyanosis, clubbing, or edema Skin:  No rashes, lesions, ulcers palpation of skin: no induration or nodules Neurologic:  CN 2-12 intact Sensation all 4 extremities intact Psychiatric:  Mental status Mood, affect appropriate Orientation to person, place, time  judgment and insight appear intact  Scheduled Meds:  sodium chloride   Intravenous Once   aspirin EC  81 mg Oral  Daily   midodrine  5 mg Oral TID WC   sodium chloride flush  3 mL Intravenous Q12H   Continuous Infusions:  sodium chloride     heparin 950 Units/hr (01/03/23 0120)    Principal Problem:   VTE (venous thromboembolism)   A & P  Complete Occlusion of left iliac Venous Stent in the setting of May Thurner Syndrome: The patient is on a heparin drip. Vascular surgery has been consulted and the plan is for the patient to go for thrombectomy later today. The patient underwent a dental procedure that required her to be off of her pradaxa for 4 days recently. She is also on dual anti-platelet therapy due to previous stenting.  Left leg pain: The patient is receivign oxycodone for pain.  I have seen and examined this patient myself. I have spent 34 minutes in er evaluation and care.  DVT prophylaxis: Heparin gtt Code Status: Full Code Family Communication: Mother at bedside Disposition Plan: Anticipate discharge to home    Monifah Freehling, DO [facility] Direct contact: see [email or web address]  7PM-7AM contact night coverage as above 01/02/2023, 7:15 PM  LOS: 3 days   Consultants  Vascular Surgery  Procedures  None  Antibiotics  None   LOS: 3 days

## 2023-01-03 NOTE — Progress Notes (Signed)
ANTICOAGULATION CONSULT NOTE - Follow-up Consult  Pharmacy Consult for heparin infusion Indication: DVT  Allergies  Allergen Reactions   Coconut (Cocos Nucifera) Anaphylaxis   Ivp Dye [Iodinated Contrast Media] Anaphylaxis and Hives   Latex Anaphylaxis   Tessalon [Benzonatate] Anaphylaxis   Yellow Dye Anaphylaxis    Other Reaction(s): Not available    Patient Measurements: Height: 5\' 3"  (160 cm) Weight: 52.2 kg (115 lb) IBW/kg (Calculated) : 52.4 Heparin Dosing Weight: 52.2 kg  Vital Signs: Temp: 98.2 F (36.8 C) (07/04 0709) Temp Source: Oral (07/04 0709) BP: 99/64 (07/04 0709) Pulse Rate: 56 (07/04 0709)  Labs: Recent Labs    12/31/22 1322 12/31/22 2225 01/01/23 0443 01/01/23 1335 01/02/23 0449 01/02/23 2139 01/03/23 0040 01/03/23 0638  HGB 12.0  --    < >  --  10.3* 10.9* 11.0*  --   HCT 38.4  --    < >  --  32.5* 33.2* 33.7*  --   PLT 197  --    < >  --  176 156 139*  --   APTT  --  26  --   --   --   --   --   --   HEPARINUNFRC  --  <0.10*   < > 0.62 0.52  --   --  0.46  CREATININE 0.71  --   --   --   --   --   --   --    < > = values in this interval not displayed.     Estimated Creatinine Clearance: 80.1 mL/min (by C-G formula based on SCr of 0.71 mg/dL).   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   Past Month   ALPRAZolam (XANAX) 0.5 MG tablet Take 0.5 mg by mouth 2 (two) times daily as needed for anxiety or sleep.   Past Month   amoxicillin (AMOXIL) 500 MG capsule Take 500 mg by mouth 3 (three) times daily.   12/31/2022   aspirin EC 81 MG tablet Take 1 tablet (81 mg total) by  mouth daily. Swallow whole. 30 tablet 12 12/30/2022   dabigatran (PRADAXA) 150 MG CAPS capsule Take 1 capsule (150 mg total) by mouth 2 (two) times daily. 60 capsule 6 12/31/2022   levocetirizine (XYZAL) 5 MG tablet Take 5 mg by mouth daily.   12/31/2022   montelukast (SINGULAIR) 10 MG tablet Take 10 mg by mouth daily.   12/31/2022   acetaminophen (TYLENOL) 325 MG tablet Take 2 tablets (650 mg total) by mouth every 4 (four) hours as needed for headache or mild pain. (Patient not taking: Reported on 12/31/2022)   Not Taking   cetirizine (ZYRTEC ALLERGY) 10 MG tablet Take 1 tablet (10 mg total) by mouth daily. (Patient not taking: Reported on 12/31/2022) 30 tablet 1 Not Taking   ferrous sulfate 325 (65 FE) MG EC tablet Take 1 tablet (325 mg total) by mouth daily with breakfast. (Patient not taking: Reported on 12/31/2022) 30 tablet 3 Not Taking   Assessment: 69 yoF presents with complete occlusion of L ileocaval venous stent extending from common femoral vein into inferior IVC  and occluded L iliac stent. 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 Last dose of dabigatran 7/1 AM PTA. Pharmacy consulted to transition to heparin.  PM update - S/p percutaneous mechanical thrombectomy of the left common iliac vein stent 7/3 with Vascular Surgery. Pharmacy consulted to resume heparin 8 hrs post-sheath removal (removed at 1639 per cath procedure log). Heparin previously therapeutic on 950 units/hr. CBC stable. No bleed issues reported.  7/4 AM update: HL 0.46 Hgb 11 PLT 139K No signs of bleeding or issues with hep gtt  Goal of Therapy:  Heparin level 0.3-0.7 units/ml Monitor platelets by anticoagulation protocol: Yes   Plan:  Continue heparin infusion at 950 units/hr Check HL (confirmatory level) @1200  and daily thereafter Monitor daily CBC, s/sx of bleeding F/u Vascular Surgery  plans   Greta Doom BS, PharmD, BCPS Clinical Pharmacist 01/03/2023 7:56 AM  Contact: [phone] after 3 PM  "Be curious, not judgmental..." -Debbora Dus

## 2023-01-03 NOTE — Progress Notes (Signed)
Mobility Specialist Progress Note    01/03/23 1234  Mobility  Activity Ambulated with assistance in hallway  Level of Assistance Contact guard assist, steadying assist  Assistive Device Front wheel walker  Distance Ambulated (ft) 330 ft  Activity Response Tolerated well  Mobility Referral Yes  $Mobility charge 1 Mobility  Mobility Specialist Start Time (ACUTE ONLY) 1216  Mobility Specialist Stop Time (ACUTE ONLY) 1233  Mobility Specialist Time Calculation (min) (ACUTE ONLY) 17 min   Pre-Mobility: 73 HR, 95% SpO2  Pt received in bed and agreeable. C/o some L groin and incision site pain that felt like "pressure". Returned to bathroom with mother present.    [place] Mobility Specialist  Please Neurosurgeon or Rehab Office at [phone]

## 2023-01-03 NOTE — Discharge Instructions (Addendum)
    [facility]E CV [facility] [address] [place], [place]  [address] Phone:  [phone]   Patient: Amber Carroll  Date of Birth: 1985/10/18  Date of Visit: 12/31/2022   To Whom It May Concern:  Amber Carroll was seen and treated on 12/31/2022 through 01/05/23   .           Sincerely,     Treatment Team:  Attending Provider: Lonia Blood, MD

## 2023-01-04 ENCOUNTER — Encounter (HOSPITAL_COMMUNITY): Payer: Self-pay | Admitting: Vascular Surgery

## 2023-01-04 DIAGNOSIS — I8290 Acute embolism and thrombosis of unspecified vein: Secondary | ICD-10-CM | POA: Diagnosis not present

## 2023-01-04 LAB — TYPE AND SCREEN
ABO/RH(D): A POS
Antibody Screen: NEGATIVE
Unit division: 0
Unit division: 0

## 2023-01-04 LAB — POCT I-STAT, CHEM 8
BUN: 9 mg/dL (ref 6–20)
Calcium, Ion: 1.15 mmol/L (ref 1.15–1.40)
Chloride: 105 mmol/L (ref 98–111)
Creatinine, Ser: 0.7 mg/dL (ref 0.44–1.00)
Glucose, Bld: 108 mg/dL — ABNORMAL HIGH (ref 70–99)
HCT: 33 % — ABNORMAL LOW (ref 36.0–46.0)
Hemoglobin: 11.2 g/dL — ABNORMAL LOW (ref 12.0–15.0)
Potassium: 3.9 mmol/L (ref 3.5–5.1)
Sodium: 140 mmol/L (ref 135–145)
TCO2: 25 mmol/L (ref 22–32)

## 2023-01-04 LAB — CBC
HCT: 29.8 % — ABNORMAL LOW (ref 36.0–46.0)
Hemoglobin: 9.7 g/dL — ABNORMAL LOW (ref 12.0–15.0)
MCH: 29 pg (ref 26.0–34.0)
MCHC: 32.6 g/dL (ref 30.0–36.0)
MCV: 89 fL (ref 80.0–100.0)
Platelets: 128 10*3/uL — ABNORMAL LOW (ref 150–400)
RBC: 3.35 MIL/uL — ABNORMAL LOW (ref 3.87–5.11)
RDW: 16.9 % — ABNORMAL HIGH (ref 11.5–15.5)
WBC: 11.2 10*3/uL — ABNORMAL HIGH (ref 4.0–10.5)
nRBC: 0 % (ref 0.0–0.2)

## 2023-01-04 LAB — BPAM RBC
Blood Product Expiration Date: 202407112359
Blood Product Expiration Date: 202407112359
ISSUE DATE / TIME: 202407031653
Unit Type and Rh: 9500

## 2023-01-04 LAB — HEPARIN LEVEL (UNFRACTIONATED): Heparin Unfractionated: 0.47 IU/mL (ref 0.30–0.70)

## 2023-01-04 MED ORDER — METHOCARBAMOL 500 MG PO TABS
500.0000 mg | ORAL_TABLET | Freq: Three times a day (TID) | ORAL | Status: DC | PRN
  Administered 2023-01-04: 500 mg via ORAL
  Filled 2023-01-04: qty 1

## 2023-01-04 MED ORDER — SODIUM CHLORIDE 0.9% IV SOLUTION: Freq: Once | INTRAVENOUS | Status: DC

## 2023-01-04 NOTE — Progress Notes (Signed)
Pt ambulated in hallway at least three times with family and rolling walker. Pt states leg is feeling better after each walk.  Pt had bowel movement today and feels she will be well enough to go home tomorrow. MD is aware. Dressings removed from left thigh and posterior popliteal. Sites dry with one suture for each site. Pt resting with call bell within reach.  Will continue to monitor.

## 2023-01-04 NOTE — Progress Notes (Signed)
[facility]                                                                               Amber Carroll, is a 37 y.o. female, DOB - 07/14/1985, NWG:[id number] Admit date - 12/31/2022    Outpatient Primary MD for the patient is Toppin, [name], MD  LOS - 4  days    Brief summary   69-year-old female with history of May Thurner syndrome, recurrent PE on Pradaxa ,left iliac stenting, who presented to the emergency departmeant with complaint of leg swelling. She recently had dental procedure and held her Pradaxa/aspirin for 4 days. She was seen in the emergency department at [facility] due to calf pain, swelling. Case was discussed with vascular surgery at [facility] and she was advised to come here. CT venogram showed complete occlusion of left iliac Venous Stent. Vascular Surgery Consulted, Started on Heparin Drip.   She underwent,  Ilio cavogram, Intravascular ultrasound of the femoral vein, common femoral vein, external iliac vein, common iliac vein, inferior vena cava with Percutaneous mechanical thrombectomy. She was cleared for discharge home on pradaxa and aspirin.  But this morning she reports feeling dizzy and muscle cramps and did not want to be discharged yet.   Assessment & Plan    Assessment and Plan:  Complete occlusion of the left Iliac venous stent in the setting of May Thurner Syndrome:  Vascular surgery consulted and she underwent percutaneous mechanical thrombectomy.  She was restarted on pradaxa and aspirin.  Pain control.    Dizziness with borderline BP parameters.  Orthostatic vital signs are negative as per RN.  Currently on low dose midodrine and BP parameters have improved.   Muscle spasms; On robaxin which appears to help.     Normocytic anemia and thrombocytopenia Continue to monitor.   Estimated body mass index is 20.37 kg/m as calculated from the following:   Height as of this encounter: 5\' 3"  (1.6 m).   Weight as of this encounter: 52.2  kg.  Code Status: full code.  DVT Prophylaxis:  SCDs Start: 12/31/22 2201 dabigatran (PRADAXA) capsule 150 mg   Level of Care: Level of care: Progressive Cardiac Family Communication: family at bedside.   Disposition Plan:     Remains inpatient appropriate:  wants to go home in the morning.   Procedures:  s/p  Ilio cavogram Intravascular ultrasound of the femoral vein, common femoral vein, external iliac vein, common iliac vein, inferior vena cava with Percutaneous mechanical thrombectomy  Consultants:   Vascular surgery.   Antimicrobials:   Anti-infectives (From admission, onward)    None        Medications  Scheduled Meds:  sodium chloride   Intravenous Once   aspirin EC  81 mg Oral Daily   dabigatran  150 mg Oral Q12H   midodrine  5 mg Oral TID WC   sodium chloride flush  3 mL Intravenous Q12H   Continuous Infusions:  sodium chloride     PRN Meds:.sodium chloride, acetaminophen, hydrALAZINE, labetalol, methocarbamol, ondansetron (ZOFRAN) IV, ondansetron **OR** [DISCONTINUED] ondansetron (ZOFRAN) IV, oxyCODONE, sodium chloride flush    Subjective:   Amber Carroll was seen and examined today.  Patient reports feeling dizzy and with muscle cramps, and does not want to be discharged yet.   Objective:   Vitals:   01/04/23 0010 01/04/23 0409 01/04/23 0855 01/04/23 1209  BP: (!) 93/52 (!) 102/58 104/61 107/61  Pulse: 65 72 66 68  Resp: 17 20 18 16   Temp: 98.4 F (36.9 C) 98.1 F (36.7 C) 98.3 F (36.8 C) 98.4 F (36.9 C)  TempSrc: Oral Oral Oral Oral  SpO2: 100% 100% 97% 99%  Weight:      Height:        Intake/Output Summary (Last 24 hours) at 01/04/2023 1703 Last data filed at 01/04/2023 1250 Gross per 24 hour  Intake 237.97 ml  Output 1 ml  Net 236.97 ml   Filed Weights   12/31/22 1356  Weight: 52.2 kg     Exam General exam: Appears calm and comfortable  Respiratory system: Clear to auscultation. Respiratory effort normal. Cardiovascular  system: S1 & S2 heard, RRR. No JVD,  Gastrointestinal system: Abdomen is nondistended, soft and nontender.  Central nervous system: Alert and oriented.  Extremities: left lower extremity slightly swollen and tender.  Skin: No rashes,     Data Reviewed:  I have personally reviewed following labs and imaging studies   CBC Lab Results  Component Value Date   WBC 11.2 (H) 01/04/2023   RBC 3.35 (L) 01/04/2023   HGB 9.7 (L) 01/04/2023   HCT 29.8 (L) 01/04/2023   MCV 89.0 01/04/2023   MCH 29.0 01/04/2023   PLT 128 (L) 01/04/2023   MCHC 32.6 01/04/2023   RDW 16.9 (H) 01/04/2023   LYMPHSABS 1.6 10/16/2022   MONOABS 0.4 10/16/2022   EOSABS 0.2 10/16/2022   BASOSABS 0.0 10/16/2022     Last metabolic panel Lab Results  Component Value Date   NA 137 12/31/2022   K 4.3 12/31/2022   CL 104 12/31/2022   CO2 22 12/31/2022   BUN 13 12/31/2022   CREATININE 0.71 12/31/2022   GLUCOSE 86 12/31/2022   GFRNONAA >60 12/31/2022   GFRAA  02/07/2009    >60        The eGFR has been calculated using the MDRD equation. This calculation has not been validated in all clinical situations. eGFR's persistently <60 mL/min signify possible Chronic Kidney Disease.   CALCIUM 8.8 (L) 12/31/2022   PHOS 3.2 02/08/2009   PROT 7.5 10/16/2022   ALBUMIN 4.1 10/16/2022   BILITOT 0.2 (L) 10/16/2022   ALKPHOS 59 10/16/2022   AST 11 (L) 10/16/2022   ALT 7 10/16/2022   ANIONGAP 11 12/31/2022    CBG (last 3)  No results for input(s): "GLUCAP" in the last 72 hours.    Coagulation Profile: No results for input(s): "INR", "PROTIME" in the last 168 hours.   Radiology Studies: No results found.     Kathlen Mody M.D. [facility] 01/04/2023, 5:03 PM  Available via Epic secure chat 7am-7pm After 7 pm, please refer to night coverage provider listed on amion.

## 2023-01-05 DIAGNOSIS — I8290 Acute embolism and thrombosis of unspecified vein: Secondary | ICD-10-CM | POA: Diagnosis not present

## 2023-01-05 LAB — CBC WITH DIFFERENTIAL/PLATELET
Abs Immature Granulocytes: 0.05 10*3/uL (ref 0.00–0.07)
Basophils Absolute: 0 10*3/uL (ref 0.0–0.1)
Basophils Relative: 0 %
Eosinophils Absolute: 0.3 10*3/uL (ref 0.0–0.5)
Eosinophils Relative: 2 %
HCT: 33.9 % — ABNORMAL LOW (ref 36.0–46.0)
Hemoglobin: 10.8 g/dL — ABNORMAL LOW (ref 12.0–15.0)
Immature Granulocytes: 1 %
Lymphocytes Relative: 34 %
Lymphs Abs: 3.6 10*3/uL (ref 0.7–4.0)
MCH: 28.8 pg (ref 26.0–34.0)
MCHC: 31.9 g/dL (ref 30.0–36.0)
MCV: 90.4 fL (ref 80.0–100.0)
Monocytes Absolute: 0.8 10*3/uL (ref 0.1–1.0)
Monocytes Relative: 7 %
Neutro Abs: 5.9 10*3/uL (ref 1.7–7.7)
Neutrophils Relative %: 56 %
Platelets: 132 10*3/uL — ABNORMAL LOW (ref 150–400)
RBC: 3.75 MIL/uL — ABNORMAL LOW (ref 3.87–5.11)
RDW: 16.9 % — ABNORMAL HIGH (ref 11.5–15.5)
WBC: 10.5 10*3/uL (ref 4.0–10.5)
nRBC: 0 % (ref 0.0–0.2)

## 2023-01-05 LAB — BASIC METABOLIC PANEL
Anion gap: 6 (ref 5–15)
BUN: 8 mg/dL (ref 6–20)
CO2: 27 mmol/L (ref 22–32)
Calcium: 8.6 mg/dL — ABNORMAL LOW (ref 8.9–10.3)
Chloride: 101 mmol/L (ref 98–111)
Creatinine, Ser: 0.72 mg/dL (ref 0.44–1.00)
GFR, Estimated: >60 mL/min (ref >60)
Glucose, Bld: 95 mg/dL (ref 70–99)
Potassium: 3.5 mmol/L (ref 3.5–5.1)
Sodium: 134 mmol/L — ABNORMAL LOW (ref 135–145)

## 2023-01-05 MED ORDER — MIDODRINE HCL 5 MG PO TABS: 5.0000 mg | ORAL_TABLET | Freq: Three times a day (TID) | ORAL | Status: DC

## 2023-01-05 MED ORDER — METHOCARBAMOL 500 MG PO TABS: 500.0000 mg | ORAL_TABLET | Freq: Three times a day (TID) | ORAL | 0 refills | Status: DC | PRN

## 2023-01-05 MED ORDER — MIDODRINE HCL 5 MG PO TABS: 5.0000 mg | ORAL_TABLET | Freq: Three times a day (TID) | ORAL | 0 refills | Status: DC

## 2023-01-05 MED ORDER — OXYCODONE HCL 5 MG PO TABS: 5.0000 mg | ORAL_TABLET | ORAL | 0 refills | Status: DC | PRN

## 2023-01-05 MED ORDER — ACETAMINOPHEN 325 MG PO TABS: 650.0000 mg | ORAL_TABLET | ORAL | Status: AC | PRN

## 2023-01-05 NOTE — Discharge Summary (Addendum)
DISCHARGE SUMMARY  Amber Carroll  MR#: [id number]  DOB:12/14/1985  Date of Admission: 12/31/2022 Date of Discharge: 01/05/2023  Attending Physician:Keyonna Comunale Silvestre Gunner, MD  Patient's WUJ:[id number], Amber Dallas, MD  Consults: Vascular Surgery  Disposition: Discharge home  Follow-up Appts:  Follow-up Information     [facility] Follow up.   Why: 4-6 weeks. 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  CT venogram in the ER revealed complete occlusion of her left iliac caval venous stent and vascular surgery was consulted.  Hospital Course: The patient was admitted to the acute units and placed on a heparin drip.  Vascular surgery attended to the patient's acute issue.  She was taken to the operating room 01/02/23 for treatment of her left iliofemoral DVT involving previous stents.  She underwent percutaneous mechanical thrombectomy and balloon venoplasty to the iliac system and common femoral vein.  Postoperatively the patient was cleared for discharge by the vascular surgery service.  She is to wear a left lower extremity thigh-high compression stocking and continue her Pradaxa and aspirin.  She was counseled that she absolutely must quit smoking.  She is to follow-up in the vascular surgery office in 4 to 6 weeks with left lower extremity aortoiliac IVC and left lower extremity venous  duplex.  Allergies as of 01/05/2023       Reactions   Coconut (cocos Nucifera) Anaphylaxis   Ivp Dye [iodinated Contrast Media] Anaphylaxis, Hives   Latex Anaphylaxis   Tessalon [benzonatate] Anaphylaxis   Yellow Dye Anaphylaxis   ONLY allergic to yellow dye in Tessalon pearls per patient Not a food allergy        Medication List     STOP taking these medications    amoxicillin 500 MG capsule Commonly known as: AMOXIL       TAKE these medications    acetaminophen 325 MG tablet Commonly known as: TYLENOL Take 2 tablets (650 mg total) by mouth every 4 (four) hours as needed for headache or mild pain.   albuterol 108 (90 Base) MCG/ACT inhaler Commonly known as: VENTOLIN HFA Inhale 2 puffs into the lungs every 6 (six) hours as needed for wheezing or shortness of breath.   ALPRAZolam 0.5 MG tablet Commonly known as: XANAX Take 0.5 mg by mouth 2 (two) times daily as needed for anxiety or sleep.   aspirin EC 81 MG tablet Take 1 tablet (81 mg total) by mouth daily. Swallow whole.   dabigatran 150 MG Caps capsule Commonly known as: Pradaxa Take 1 capsule (150 mg total) by mouth 2 (two) times daily.   levocetirizine 5 MG tablet Commonly known as: XYZAL Take 5 mg by mouth daily.   methocarbamol 500 MG tablet Commonly known as: ROBAXIN Take 1 tablet (500 mg total) by mouth every 8 (eight) hours as needed for muscle spasms.   midodrine 5 MG tablet Commonly known as: PROAMATINE Take 1 tablet (5  mg total) by mouth 3 (three) times daily with meals.   montelukast 10 MG tablet Commonly known as: SINGULAIR Take 10 mg by mouth daily.   oxyCODONE 5 MG immediate release tablet Commonly known as: Oxy IR/ROXICODONE Take 1 tablet (5 mg total) by mouth every 4 (four) hours as needed for moderate pain.        Day of Discharge BP (!) 91/50 (BP Location: Left Arm)   Pulse 76   Temp 98 F (36.7 C) (Oral)   Resp 18   Ht 5\' 3"  (1.6 m)   Wt 52.2 kg   SpO2 100%   BMI  20.37 kg/m   Physical Exam: General: No acute respiratory distress Lungs: Clear to auscultation bilaterally without wheezes or crackles Cardiovascular: Regular rate and rhythm without murmur  Abdomen: Nontender, nondistended, soft, bowel sounds positive Extremities: L LE edema at 2+ - no edema R LE   Basic Metabolic Panel: Recent Labs  Lab 12/31/22 1322 01/02/23 1659 01/05/23 0120  NA 137 140 134*  K 4.3 3.9 3.5  CL 104 105 101  CO2 22  --  27  GLUCOSE 86 108* 95  BUN 13 9 8   CREATININE 0.71 0.70 0.72  CALCIUM 8.8*  --  8.6*   CBC: Recent Labs  Lab 01/02/23 0449 01/02/23 1659 01/02/23 2139 01/03/23 0040 01/04/23 0116 01/05/23 0120  WBC 9.3  --  10.8* 10.3 11.2* 10.5  NEUTROABS  --   --   --   --   --  5.9  HGB 10.3* 11.2* 10.9* 11.0* 9.7* 10.8*  HCT 32.5* 33.0* 33.2* 33.7* 29.8* 33.9*  MCV 92.1  --  91.0 90.3 89.0 90.4  PLT 176  --  156 139* 128* 132*     Time spent in discharge (includes decision making & examination of pt): 35 minutes  01/05/2023, 11:58 AM   Lonia Blood, MD [facility] Office  [phone]

## 2023-01-07 NOTE — Progress Notes (Signed)
Fax received from [facility] on 12/19/22 for medical clearance/medication hold for surgical extraction to be signed by B. Randie Heinz, MD.  Provider signed on 01/02/23, form faxed back to sender on 01/02/23, verified successful, sent to scan center.

## 2023-01-09 ENCOUNTER — Ambulatory Visit: Payer: 59

## 2023-01-09 ENCOUNTER — Encounter (HOSPITAL_COMMUNITY): Payer: 59

## 2023-01-11 ENCOUNTER — Telehealth: Payer: Self-pay

## 2023-01-11 NOTE — Telephone Encounter (Signed)
Pt called with c/o LLE pain from groin to knee area s/p thrombectomy. She states that it is tolerable when she is laying but moving/walking is very painful and has been since discharge. She states she has been wearing compression and elevating her leg. She became tearful when advised that she should consider going to ED if pain is that severe. She is currently back at the [facility]. MD on call has been made aware.

## 2023-01-24 ENCOUNTER — Telehealth: Payer: Self-pay

## 2023-01-24 NOTE — Telephone Encounter (Signed)
Pt called to request advice on any OTC pain meds she could take since she is still having "excruciating pain" with ambulation and standing.  Reviewed pt's chart, returned call for clarification, two identifiers used. Pt states that she is only taking the Oxy at night now. Reviewed different ways she could supplement with Tylenol without going over 4000 mg daily. Encouraged her to continue elevating and wearing compressions. Confirmed understanding.

## 2023-01-28 ENCOUNTER — Encounter: Payer: Self-pay | Admitting: Physician Assistant

## 2023-01-28 ENCOUNTER — Other Ambulatory Visit: Payer: Self-pay | Admitting: *Deleted

## 2023-01-28 ENCOUNTER — Ambulatory Visit (INDEPENDENT_AMBULATORY_CARE_PROVIDER_SITE_OTHER)
Admission: RE | Admit: 2023-01-28 | Discharge: 2023-01-28 | Disposition: A | Discharge status: Home / Self Care | Payer: 59 | Source: Ambulatory Visit | Attending: Vascular Surgery | Admitting: Vascular Surgery

## 2023-01-28 ENCOUNTER — Inpatient Hospital Stay (HOSPITAL_COMMUNITY): Admission: RE | Admit: 2023-01-28 | Payer: 59 | Source: Ambulatory Visit

## 2023-01-28 ENCOUNTER — Encounter: Payer: Self-pay | Admitting: *Deleted

## 2023-01-28 ENCOUNTER — Ambulatory Visit (INDEPENDENT_AMBULATORY_CARE_PROVIDER_SITE_OTHER): Payer: 59 | Admitting: Physician Assistant

## 2023-01-28 DIAGNOSIS — I82422 Acute embolism and thrombosis of left iliac vein: Secondary | ICD-10-CM | POA: Insufficient documentation

## 2023-01-28 DIAGNOSIS — M7989 Other specified soft tissue disorders: Secondary | ICD-10-CM | POA: Diagnosis not present

## 2023-01-28 DIAGNOSIS — I871 Compression of vein: Secondary | ICD-10-CM | POA: Insufficient documentation

## 2023-01-28 DIAGNOSIS — I82402 Acute embolism and thrombosis of unspecified deep veins of left lower extremity: Secondary | ICD-10-CM | POA: Diagnosis present

## 2023-01-28 DIAGNOSIS — M79661 Pain in right lower leg: Secondary | ICD-10-CM | POA: Diagnosis not present

## 2023-01-28 DIAGNOSIS — J45909 Unspecified asthma, uncomplicated: Secondary | ICD-10-CM | POA: Diagnosis not present

## 2023-01-28 DIAGNOSIS — F17210 Nicotine dependence, cigarettes, uncomplicated: Secondary | ICD-10-CM | POA: Diagnosis not present

## 2023-01-28 DIAGNOSIS — Z86711 Personal history of pulmonary embolism: Secondary | ICD-10-CM | POA: Diagnosis not present

## 2023-01-28 DIAGNOSIS — Z91040 Latex allergy status: Secondary | ICD-10-CM | POA: Diagnosis not present

## 2023-01-28 NOTE — Progress Notes (Signed)
Office Note     CC:  follow up Requesting Provider:  Audery Amel, MD  HPI: Amber Carroll is a 37 y.o. (1985-08-31) female who presents with ongoing pain and worsening edema of the left lower extremity status post left lower extremity venous thrombectomy with penumbra and balloon angioplasty of iliac system and common femoral vein due to thrombosed iliac and femoral venous stents.  This was performed by Dr. Karin Lieu on 01/02/2023.  She noticed a sudden increase in her edema and pain in the left leg on Friday, 01/25/2023.  Since then she has felt tightness extending up to her proximal calf.  She has had numerous interventions with the first being performed in [place] several years ago.  She has been taking her Pradaxa and aspirin on daily basis.  She also wears her thigh-high compression on a daily basis.  She has known coagulopathy.  She continues to smoke on a daily basis.   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 [facility])     Past Surgical History:  Procedure Laterality Date   CORONARY ULTRASOUND/IVUS Left 08/17/2021   Procedure: Intravascular Ultrasound/IVUS;  Surgeon: Cephus Shelling, MD;  Location: [facility] INVASIVE CV LAB;  Service: Cardiovascular;  Laterality: Left;  Venous   PATENT FORAMEN OVALE(PFO) CLOSURE     PERIPHERAL VASCULAR THROMBECTOMY N/A 08/17/2021   Procedure: PERIPHERAL VASCULAR THROMBECTOMY;  Surgeon: Cephus Shelling, MD;  Location: [facility] INVASIVE CV LAB;  Service: Cardiovascular;  Laterality: N/A;  placement of catheter   PERIPHERAL VASCULAR THROMBECTOMY N/A 08/18/2021   Procedure: LYSIS RECHECK;  Surgeon: Leonie Douglas, MD;  Location: [facility] INVASIVE CV LAB;  Service: Cardiovascular;  Laterality: N/A;   PERIPHERAL VASCULAR THROMBECTOMY N/A 05/14/2022   Procedure: PERIPHERAL VASCULAR THROMBECTOMY;  Surgeon: Maeola Harman, MD;  Location: [facility] INVASIVE CV LAB;   Service: Cardiovascular;  Laterality: N/A;   PERIPHERAL VASCULAR THROMBECTOMY N/A 01/02/2023   Procedure: PERIPHERAL VASCULAR THROMBECTOMY;  Surgeon: Victorino Sparrow, MD;  Location: [facility] INVASIVE CV LAB;  Service: Cardiovascular;  Laterality: N/A;    Social History   Socioeconomic History   Marital status: Legally Separated    Spouse name: Not on file   Number of children: Not on file   Years of education: Not on file   Highest education level: Not on file  Occupational History   Not on file  Tobacco Use   Smoking status: Every Day    Current packs/day: 0.50    Average packs/day: 0.5 packs/day for 14.0 years (7.0 ttl pk-yrs)    Types: Cigarettes   Smokeless tobacco: Never  Substance and Sexual Activity   Alcohol use: No   Drug use: No   Sexual activity: Not Currently  Other Topics Concern   Not on file  Social History Narrative   Not on file   Social Determinants of Health   Financial Resource Strain: Low Risk  (06/19/2021)   Overall Financial Resource Strain (CARDIA)    Difficulty of Paying Living Expenses: Not hard at all  Food Insecurity: No Food Insecurity (01/01/2023)   Hunger Vital Sign    Worried About Running Out of Food in the Last Year: Never true    Ran Out of Food in the Last Year: Never true  Transportation Needs: No Transportation Needs (01/01/2023)   PRAPARE - Administrator, Civil Service (Medical): No    Lack of Transportation (Non-Medical): No  Physical Activity: Insufficiently Active (06/19/2021)   Exercise Vital Sign    Days of Exercise per Week: 1 day    Minutes of Exercise per Session: 60 min  Stress: Stress Concern Present (06/19/2021)   [employer] of Occupational Health - Occupational Stress Questionnaire    Feeling of Stress : Rather much  Social Connections: Unknown (11/04/2021)   Received from [facility]   Social Network    Social Network: Not on file  Intimate Partner Violence: Not At Risk (01/01/2023)   Humiliation, Afraid,  Rape, and Kick questionnaire    Fear of Current or Ex-Partner: No    Emotionally Abused: No    Physically Abused: No    Sexually Abused: No    Family History  Problem Relation Age of Onset   Factor V Leiden deficiency Father     Current Outpatient Medications  Medication Sig Dispense Refill   acetaminophen (TYLENOL) 325 MG tablet Take 2 tablets (650 mg total) by mouth every 4 (four) hours as needed for headache or mild pain.     albuterol (VENTOLIN HFA) 108 (90 Base) MCG/ACT inhaler Inhale 2 puffs into the lungs every 6 (six) hours as needed for wheezing or shortness of breath.     ALPRAZolam (XANAX) 0.5 MG tablet Take 0.5 mg by mouth 2 (two) times daily as needed for anxiety or sleep.     aspirin EC 81 MG tablet Take 1 tablet (81 mg total) by mouth daily. Swallow whole. 30 tablet 12   dabigatran (PRADAXA) 150 MG CAPS capsule Take 1 capsule (150 mg total) by mouth 2 (two) times daily. 60 capsule 6   levocetirizine (XYZAL) 5 MG tablet Take 5 mg by mouth daily.     methocarbamol (ROBAXIN) 500 MG tablet Take 1 tablet (500 mg total) by mouth every 8 (eight) hours as needed for muscle spasms. 20 tablet 0   midodrine (PROAMATINE) 5 MG tablet Take 1 tablet (5 mg total) by mouth 3 (three) times daily with meals. 15 tablet 0   montelukast (SINGULAIR) 10 MG tablet Take 10 mg by mouth daily.     oxyCODONE (OXY IR/ROXICODONE) 5 MG immediate release tablet Take 1 tablet (5 mg total) by mouth every 4 (four) hours as needed for moderate pain. 30 tablet 0   No current facility-administered medications for this visit.    Allergies  Allergen Reactions   Coconut (Cocos Nucifera) Anaphylaxis   Ivp Dye [Iodinated Contrast Media] Anaphylaxis and Hives   Latex Anaphylaxis   Tessalon [Benzonatate] Anaphylaxis   Yellow Dye Anaphylaxis    ONLY allergic to yellow dye in Tessalon pearls per patient Not a food allergy      REVIEW OF SYSTEMS:   [X]  denotes positive finding, [ ]  denotes negative  finding Cardiac  Comments:  Chest pain or chest pressure:    Shortness of breath upon exertion:    Short of breath when lying flat:    Irregular heart rhythm:        Vascular    Pain in calf, thigh, or hip brought on by ambulation:    Pain in feet at night that wakes you up from your sleep:     Blood clot in your veins:    Leg swelling:         Pulmonary    Oxygen at home:    Productive cough:     Wheezing:         Neurologic    Sudden weakness in arms or legs:  Sudden numbness in arms or legs:     Sudden onset of difficulty speaking or slurred speech:    Temporary loss of vision in one eye:     Problems with dizziness:         Gastrointestinal    Blood in stool:     Vomited blood:         Genitourinary    Burning when urinating:     Blood in urine:        Psychiatric    Major depression:         Hematologic    Bleeding problems:    Problems with blood clotting too easily:        Skin    Rashes or ulcers:        Constitutional    Fever or chills:      PHYSICAL EXAMINATION:  There were no vitals filed for this visit.  General:  WDWN in NAD; vital signs documented above Gait: Not observed HENT: WNL, normocephalic Pulmonary: normal non-labored breathing , without Rales, rhonchi,  wheezing Cardiac: regular HR Abdomen: soft, NT, no masses Skin: without rashes Vascular Exam/Pulses: palpable L DP; L calf soft; edema LLE compared to R Extremities: without ischemic changes, without Gangrene , without cellulitis; without open wounds;  Musculoskeletal: no muscle wasting or atrophy  Neurologic: A&O X 3 Psychiatric:  The pt has Normal affect.   Non-Invasive Vascular Imaging:    Patent IVC Acute appearing thrombus in the left common iliac vein and external iliac vein   ASSESSMENT/PLAN:: 37 y.o. female with history of numerous left iliofemoral venous interventions due to May Thurner syndrome  -Ms. Amber Carroll is a 36 year old female with known clotting  disorder.  She has had numerous interventions starting at an outside facility in [place] several years ago due to left lower extremity DVT involving the iliac venous system.  Most recent intervention came on 01/02/2023.  This included mechanical thrombectomy with penumbra and balloon angioplasty due to thrombosed iliofemoral venous stents of the left leg.  Since that time she has been taking her Pradaxa and aspirin on a daily basis as well as wearing thigh-high compression regularly.  She first noticed an increase in edema symptoms of her left leg on Friday, 3 days ago.  Symptoms have progressed to her proximal calf.  Duplex today demonstrates thrombosed left common and external iliac venous stents.  Case was discussed with Dr. Lenell Antu who suggested mechanical thrombectomy versus lysis.  Patient is agreeable to proceed on Wednesday in the Cath Lab with Dr. Karin Lieu.   Emilie Rutter, PA-C [facility] [phone]  Clinic MD:   Lenell Antu

## 2023-01-29 ENCOUNTER — Encounter (HOSPITAL_COMMUNITY): Payer: Self-pay | Admitting: Vascular Surgery

## 2023-01-29 ENCOUNTER — Other Ambulatory Visit: Payer: Self-pay

## 2023-01-29 NOTE — Progress Notes (Signed)
.PCP -  Cardiologist -   PPM/ICD - Denies Device Orders - n/a Rep Notified - n/a  Chest x-ray - Denies EKG - 01-01-23 Stress Test - Denies ECHO - 09-02-2020 Cardiac Cath - Denies  CPAP - Denies  DM- Denies  Blood Thinner Instructions: Hold Praxada per Surgeon Aspirin Instructions: Continue with Jonne Ply per Surgeon  ERAS Protcol - No NPO  COVID TEST- N/a Denies any symptoms  Anesthesia review: No  Patient verbally denies any shortness of breath, fever, cough and chest pain during phone call   -------------  SDW INSTRUCTIONS given:  Your procedure is scheduled on January 30, 2023.  Report to [facility] Main Entrance "A" at 11:00 A.M., and check in at the Admitting office.  Call this number if you have problems the morning of surgery:  [phone]   Remember:  Do not eat after midnight the night before your surgery    Take these medicines the morning of surgery with A SIP OF WATER   aspirin EC     IF NEEDED:  acetaminophen (TYLENOL)   albuterol (VENTOLIN HFA)  MCG/ACT inhaler  May bring with her to facility  ALPRAZolam Prudy Feeler)   levocetirizine (XYZAL)   montelukast (SINGULAIR)   methocarbamol (ROBAXIN)   oxyCODONE (OXY IR/ROXICODONE) 5 MG    As of today, STOP taking any  Aleve, Naproxen, Ibuprofen, Motrin, Advil, Goody's, BC's, all herbal medications, fish oil, and all vitamins. Please Hold Pradaxa per surgeon.                      Do not wear jewelry, make up, or nail polish            Do not wear lotions, powders, perfumes/colognes, or deodorant.            Do not shave 48 hours prior to surgery.  Men may shave face and neck.            Do not bring valuables to the hospital.            [facility] is not responsible for any belongings or valuables.  Do NOT Smoke (Tobacco/Vaping) 24 hours prior to your procedure If you use a CPAP at night, you may bring all equipment for your overnight stay.   Contacts, glasses, dentures or bridgework may not be worn into  surgery.      For patients admitted to the hospital, discharge time will be determined by your treatment team.   Patients discharged the day of surgery will not be allowed to drive home, and someone needs to stay with them for 24 hours.    Special instructions:   [place]- Preparing For Surgery  Before surgery, you can play an important role. Because skin is not sterile, your skin needs to be as free of germs as possible. You can reduce the number of germs on your skin by washing with CHG (chlorahexidine gluconate) Soap before surgery.  CHG is an antiseptic cleaner which kills germs and bonds with the skin to continue killing germs even after washing.    Oral Hygiene is also important to reduce your risk of infection.  Remember - BRUSH YOUR TEETH THE MORNING OF SURGERY WITH YOUR REGULAR TOOTHPASTE  Please do not use if you have an allergy to CHG or antibacterial soaps. If your skin becomes reddened/irritated stop using the CHG.  Do not shave (including legs and underarms) for at least 48 hours prior to first CHG shower. It is OK to  shave your face.  Please follow these instructions carefully.   Shower the NIGHT BEFORE SURGERY and the MORNING OF SURGERY with DIAL Soap.   Pat yourself dry with a CLEAN TOWEL.  Wear CLEAN PAJAMAS to bed the night before surgery  Place CLEAN SHEETS on your bed the night of your first shower and DO NOT SLEEP WITH PETS.   Day of Surgery: Please shower morning of surgery  Wear Clean/Comfortable clothing the morning of surgery Do not apply any deodorants/lotions.   Remember to brush your teeth WITH YOUR REGULAR TOOTHPASTE.   Questions were answered. Patient verbalized understanding of instructions.

## 2023-01-30 ENCOUNTER — Observation Stay (HOSPITAL_COMMUNITY)
Admission: RE | Admit: 2023-01-30 | Discharge: 2023-01-31 | Disposition: A | Discharge status: Home / Self Care | Payer: 59 | Attending: Vascular Surgery | Admitting: Vascular Surgery

## 2023-01-30 ENCOUNTER — Inpatient Hospital Stay (HOSPITAL_COMMUNITY): Payer: 59 | Admitting: Certified Registered"

## 2023-01-30 ENCOUNTER — Encounter (HOSPITAL_COMMUNITY)
Admission: RE | Disposition: A | Discharge status: Home / Self Care | Payer: Self-pay | Source: Home / Self Care | Attending: Vascular Surgery

## 2023-01-30 ENCOUNTER — Other Ambulatory Visit: Payer: Self-pay

## 2023-01-30 ENCOUNTER — Encounter (HOSPITAL_COMMUNITY): Payer: Self-pay | Admitting: Vascular Surgery

## 2023-01-30 ENCOUNTER — Inpatient Hospital Stay (HOSPITAL_COMMUNITY): Payer: 59

## 2023-01-30 DIAGNOSIS — Z86711 Personal history of pulmonary embolism: Secondary | ICD-10-CM | POA: Diagnosis not present

## 2023-01-30 DIAGNOSIS — I82422 Acute embolism and thrombosis of left iliac vein: Secondary | ICD-10-CM

## 2023-01-30 DIAGNOSIS — D6859 Other primary thrombophilia: Secondary | ICD-10-CM | POA: Diagnosis present

## 2023-01-30 DIAGNOSIS — T82868A Thrombosis of vascular prosthetic devices, implants and grafts, initial encounter: Secondary | ICD-10-CM | POA: Diagnosis not present

## 2023-01-30 DIAGNOSIS — Z91018 Allergy to other foods: Secondary | ICD-10-CM | POA: Diagnosis not present

## 2023-01-30 DIAGNOSIS — I82409 Acute embolism and thrombosis of unspecified deep veins of unspecified lower extremity: Secondary | ICD-10-CM

## 2023-01-30 DIAGNOSIS — I70202 Unspecified atherosclerosis of native arteries of extremities, left leg: Secondary | ICD-10-CM | POA: Diagnosis present

## 2023-01-30 DIAGNOSIS — F419 Anxiety disorder, unspecified: Secondary | ICD-10-CM | POA: Diagnosis present

## 2023-01-30 DIAGNOSIS — Z888 Allergy status to other drugs, medicaments and biological substances status: Secondary | ICD-10-CM

## 2023-01-30 DIAGNOSIS — I745 Embolism and thrombosis of iliac artery: Secondary | ICD-10-CM | POA: Diagnosis not present

## 2023-01-30 DIAGNOSIS — Y838 Other surgical procedures as the cause of abnormal reaction of the patient, or of later complication, without mention of misadventure at the time of the procedure: Secondary | ICD-10-CM | POA: Diagnosis present

## 2023-01-30 DIAGNOSIS — J45909 Unspecified asthma, uncomplicated: Secondary | ICD-10-CM | POA: Insufficient documentation

## 2023-01-30 DIAGNOSIS — Z9102 Food additives allergy status: Secondary | ICD-10-CM

## 2023-01-30 DIAGNOSIS — F418 Other specified anxiety disorders: Secondary | ICD-10-CM | POA: Diagnosis not present

## 2023-01-30 DIAGNOSIS — T82856A Stenosis of peripheral vascular stent, initial encounter: Principal | ICD-10-CM | POA: Diagnosis present

## 2023-01-30 DIAGNOSIS — F17210 Nicotine dependence, cigarettes, uncomplicated: Secondary | ICD-10-CM | POA: Diagnosis present

## 2023-01-30 DIAGNOSIS — Z91041 Radiographic dye allergy status: Secondary | ICD-10-CM | POA: Diagnosis not present

## 2023-01-30 DIAGNOSIS — J449 Chronic obstructive pulmonary disease, unspecified: Secondary | ICD-10-CM | POA: Diagnosis not present

## 2023-01-30 DIAGNOSIS — I8290 Acute embolism and thrombosis of unspecified vein: Principal | ICD-10-CM

## 2023-01-30 DIAGNOSIS — Z86718 Personal history of other venous thrombosis and embolism: Secondary | ICD-10-CM | POA: Diagnosis not present

## 2023-01-30 DIAGNOSIS — Z832 Family history of diseases of the blood and blood-forming organs and certain disorders involving the immune mechanism: Secondary | ICD-10-CM | POA: Diagnosis not present

## 2023-01-30 DIAGNOSIS — Z91040 Latex allergy status: Secondary | ICD-10-CM | POA: Insufficient documentation

## 2023-01-30 DIAGNOSIS — I82402 Acute embolism and thrombosis of unspecified deep veins of left lower extremity: Secondary | ICD-10-CM | POA: Diagnosis not present

## 2023-01-30 HISTORY — DX: Patent foramen ovale: Q21.12

## 2023-01-30 HISTORY — DX: Other primary thrombophilia: D68.59

## 2023-01-30 HISTORY — DX: Unspecified asthma, uncomplicated: J45.909

## 2023-01-30 HISTORY — PX: THROMBECTOMY ILIAC ARTERY: SHX6405

## 2023-01-30 HISTORY — DX: Anemia, unspecified: D64.9

## 2023-01-30 HISTORY — DX: Anxiety disorder, unspecified: F41.9

## 2023-01-30 LAB — TYPE AND SCREEN
ABO/RH(D): A POS
Antibody Screen: NEGATIVE
Unit division: 0
Unit division: 0
Unit division: 0
Unit division: 0

## 2023-01-30 LAB — BASIC METABOLIC PANEL
Anion gap: 7 (ref 5–15)
BUN: 5 mg/dL — ABNORMAL LOW (ref 6–20)
CO2: 21 mmol/L — ABNORMAL LOW (ref 22–32)
Calcium: 8.6 mg/dL — ABNORMAL LOW (ref 8.9–10.3)
Chloride: 109 mmol/L (ref 98–111)
Creatinine, Ser: 0.72 mg/dL (ref 0.44–1.00)
GFR, Estimated: >60 mL/min (ref >60)
Glucose, Bld: 84 mg/dL (ref 70–99)
Potassium: 3.9 mmol/L (ref 3.5–5.1)
Sodium: 137 mmol/L (ref 135–145)

## 2023-01-30 LAB — CBC
HCT: 38 % (ref 36.0–46.0)
HCT: 30.9 % — ABNORMAL LOW (ref 36.0–46.0)
Hemoglobin: 11.6 g/dL — ABNORMAL LOW (ref 12.0–15.0)
Hemoglobin: 9.9 g/dL — ABNORMAL LOW (ref 12.0–15.0)
MCH: 29.1 pg (ref 26.0–34.0)
MCH: 30 pg (ref 26.0–34.0)
MCHC: 30.5 g/dL (ref 30.0–36.0)
MCHC: 32 g/dL (ref 30.0–36.0)
MCV: 95.2 fL (ref 80.0–100.0)
MCV: 93.6 fL (ref 80.0–100.0)
Platelets: 159 10*3/uL (ref 150–400)
Platelets: 128 10*3/uL — ABNORMAL LOW (ref 150–400)
RBC: 3.99 MIL/uL (ref 3.87–5.11)
RBC: 3.3 MIL/uL — ABNORMAL LOW (ref 3.87–5.11)
RDW: 15.5 % (ref 11.5–15.5)
RDW: 15.1 % (ref 11.5–15.5)
WBC: 5.9 10*3/uL (ref 4.0–10.5)
WBC: 9 10*3/uL (ref 4.0–10.5)
nRBC: 0 % (ref 0.0–0.2)
nRBC: 0 % (ref 0.0–0.2)

## 2023-01-30 LAB — BPAM RBC
Blood Product Expiration Date: 202408242359
Blood Product Expiration Date: 202408242359
Blood Product Expiration Date: 202408242359
Blood Product Expiration Date: 202408242359
ISSUE DATE / TIME: 202407311610
ISSUE DATE / TIME: 202407311610
ISSUE DATE / TIME: 202407311610
ISSUE DATE / TIME: 202407311610
Unit Type and Rh: 6200
Unit Type and Rh: 6200
Unit Type and Rh: 6200
Unit Type and Rh: 6200

## 2023-01-30 LAB — POCT I-STAT 7, (LYTES, BLD GAS, ICA,H+H)
Acid-base deficit: 3 mmol/L — ABNORMAL HIGH (ref 0.0–2.0)
Acid-base deficit: 3 mmol/L — ABNORMAL HIGH (ref 0.0–2.0)
Bicarbonate: 21.6 mmol/L (ref 20.0–28.0)
Bicarbonate: 22.2 mmol/L (ref 20.0–28.0)
Calcium, Ion: 1.15 mmol/L (ref 1.15–1.40)
Calcium, Ion: 1.19 mmol/L (ref 1.15–1.40)
HCT: 24 % — ABNORMAL LOW (ref 36.0–46.0)
HCT: 28 % — ABNORMAL LOW (ref 36.0–46.0)
Hemoglobin: 8.2 g/dL — ABNORMAL LOW (ref 12.0–15.0)
Hemoglobin: 9.5 g/dL — ABNORMAL LOW (ref 12.0–15.0)
O2 Saturation: 100 %
O2 Saturation: 100 %
Patient temperature: 35.8
Potassium: 4 mmol/L (ref 3.5–5.1)
Potassium: 4 mmol/L (ref 3.5–5.1)
Sodium: 140 mmol/L (ref 135–145)
Sodium: 140 mmol/L (ref 135–145)
TCO2: 23 mmol/L (ref 22–32)
TCO2: 23 mmol/L (ref 22–32)
pCO2 arterial: 33.2 mmHg (ref 32–48)
pCO2 arterial: 42 mmHg (ref 32–48)
pH, Arterial: 7.332 — ABNORMAL LOW (ref 7.35–7.45)
pH, Arterial: 7.416 (ref 7.35–7.45)
pO2, Arterial: 309 mmHg — ABNORMAL HIGH (ref 83–108)
pO2, Arterial: 347 mmHg — ABNORMAL HIGH (ref 83–108)

## 2023-01-30 LAB — PREPARE RBC (CROSSMATCH)

## 2023-01-30 LAB — POCT PREGNANCY, URINE: Preg Test, Ur: NEGATIVE

## 2023-01-30 SURGERY — THROMBECTOMY, ARTERY, ILIAC
Anesthesia: General | Site: Leg Upper | Laterality: Left

## 2023-01-30 SURGERY — PERIPHERAL VASCULAR THROMBECTOMY
Anesthesia: LOCAL

## 2023-01-30 MED ORDER — PHENOL 1.4 % MT LIQD: 1.0000 | OROMUCOSAL | Status: DC | PRN

## 2023-01-30 MED ORDER — ALBUMIN HUMAN 5 % IV SOLN: INTRAVENOUS | Status: DC | PRN

## 2023-01-30 MED ORDER — LIDOCAINE 2% (20 MG/ML) 5 ML SYRINGE
INTRAMUSCULAR | Status: AC
  Filled 2023-01-30: qty 5

## 2023-01-30 MED ORDER — FENTANYL CITRATE (PF) 250 MCG/5ML IJ SOLN
INTRAMUSCULAR | Status: AC
  Filled 2023-01-30: qty 5

## 2023-01-30 MED ORDER — CHLORHEXIDINE GLUCONATE 0.12 % MT SOLN: 15.0000 mL | Freq: Once | OROMUCOSAL | Status: AC

## 2023-01-30 MED ORDER — METHYLPREDNISOLONE SODIUM SUCC 125 MG IJ SOLR
INTRAMUSCULAR | Status: AC
  Filled 2023-01-30: qty 2

## 2023-01-30 MED ORDER — LACTATED RINGERS IV SOLN: INTRAVENOUS | Status: DC

## 2023-01-30 MED ORDER — ACETAMINOPHEN 650 MG RE SUPP: 325.0000 mg | RECTAL | Status: DC | PRN

## 2023-01-30 MED ORDER — PROPOFOL 10 MG/ML IV BOLUS
INTRAVENOUS | Status: DC | PRN
  Administered 2023-01-30: 200 mg via INTRAVENOUS

## 2023-01-30 MED ORDER — HEPARIN 6000 UNIT IRRIGATION SOLUTION
Status: AC
  Filled 2023-01-30: qty 500

## 2023-01-30 MED ORDER — 0.9 % SODIUM CHLORIDE (POUR BTL) OPTIME
TOPICAL | Status: DC | PRN
  Administered 2023-01-30: 1000 mL

## 2023-01-30 MED ORDER — SODIUM CHLORIDE 0.9 % IV SOLN: INTRAVENOUS | Status: DC

## 2023-01-30 MED ORDER — SODIUM CHLORIDE 0.9% IV SOLUTION: Freq: Once | INTRAVENOUS | Status: DC

## 2023-01-30 MED ORDER — ONDANSETRON HCL 4 MG/2ML IJ SOLN: 4.0000 mg | Freq: Four times a day (QID) | INTRAMUSCULAR | Status: DC | PRN

## 2023-01-30 MED ORDER — ALUM & MAG HYDROXIDE-SIMETH 200-200-20 MG/5ML PO SUSP: 15.0000 mL | ORAL | Status: DC | PRN

## 2023-01-30 MED ORDER — DIPHENHYDRAMINE HCL 50 MG/ML IJ SOLN
INTRAMUSCULAR | Status: AC
  Administered 2023-01-30: 25 mg via INTRAVENOUS
  Filled 2023-01-30: qty 1

## 2023-01-30 MED ORDER — HYDRALAZINE HCL 20 MG/ML IJ SOLN: 5.0000 mg | INTRAMUSCULAR | Status: DC | PRN

## 2023-01-30 MED ORDER — MIDAZOLAM HCL 2 MG/2ML IJ SOLN
INTRAMUSCULAR | Status: AC
  Filled 2023-01-30: qty 2

## 2023-01-30 MED ORDER — ONDANSETRON HCL 4 MG/2ML IJ SOLN
INTRAMUSCULAR | Status: DC | PRN
  Administered 2023-01-30: 4 mg via INTRAVENOUS

## 2023-01-30 MED ORDER — METOPROLOL TARTRATE 5 MG/5ML IV SOLN: 2.0000 mg | INTRAVENOUS | Status: DC | PRN

## 2023-01-30 MED ORDER — GLYCOPYRROLATE 0.2 MG/ML IJ SOLN
INTRAMUSCULAR | Status: DC | PRN
Start: 2023-01-30 — End: 2023-01-30
  Administered 2023-01-30: .2 mg via INTRAVENOUS

## 2023-01-30 MED ORDER — LACTATED RINGERS IV SOLN: INTRAVENOUS | Status: DC | PRN

## 2023-01-30 MED ORDER — CEFAZOLIN SODIUM-DEXTROSE 2-3 GM-%(50ML) IV SOLR
INTRAVENOUS | Status: DC | PRN
  Administered 2023-01-30: 2 g via INTRAVENOUS

## 2023-01-30 MED ORDER — POTASSIUM CHLORIDE CRYS ER 20 MEQ PO TBCR: 20.0000 meq | EXTENDED_RELEASE_TABLET | Freq: Once | ORAL | Status: DC

## 2023-01-30 MED ORDER — VASOPRESSIN 20 UNIT/ML IV SOLN
INTRAVENOUS | Status: AC
  Filled 2023-01-30: qty 1

## 2023-01-30 MED ORDER — MIDAZOLAM HCL 2 MG/2ML IJ SOLN
INTRAMUSCULAR | Status: DC | PRN
  Administered 2023-01-30: 2 mg via INTRAVENOUS

## 2023-01-30 MED ORDER — ACETAMINOPHEN 325 MG PO TABS
325.0000 mg | ORAL_TABLET | ORAL | Status: DC | PRN
  Administered 2023-01-31: 650 mg via ORAL
  Filled 2023-01-30 (×2): qty 2

## 2023-01-30 MED ORDER — SUGAMMADEX SODIUM 200 MG/2ML IV SOLN
INTRAVENOUS | Status: DC | PRN
  Administered 2023-01-30: 200 mg via INTRAVENOUS

## 2023-01-30 MED ORDER — MORPHINE SULFATE (PF) 2 MG/ML IV SOLN
2.0000 mg | INTRAVENOUS | Status: DC | PRN
  Administered 2023-01-30 – 2023-01-31 (×5): 2 mg via INTRAVENOUS
  Filled 2023-01-30 (×5): qty 1

## 2023-01-30 MED ORDER — PROPOFOL 10 MG/ML IV BOLUS
INTRAVENOUS | Status: AC
  Filled 2023-01-30: qty 20

## 2023-01-30 MED ORDER — DIPHENHYDRAMINE HCL 50 MG/ML IJ SOLN: 25.0000 mg | INTRAMUSCULAR | Status: AC

## 2023-01-30 MED ORDER — SODIUM CHLORIDE (PF) 0.9 % IJ SOLN
INTRAVENOUS | Status: DC | PRN
  Administered 2023-01-30: 100 mL via INTRAMUSCULAR

## 2023-01-30 MED ORDER — LABETALOL HCL 5 MG/ML IV SOLN: 10.0000 mg | INTRAVENOUS | Status: DC | PRN

## 2023-01-30 MED ORDER — ASPIRIN 81 MG PO TBEC
81.0000 mg | DELAYED_RELEASE_TABLET | Freq: Two times a day (BID) | ORAL | Status: DC
  Administered 2023-01-30 – 2023-01-31 (×2): 81 mg via ORAL
  Filled 2023-01-30 (×2): qty 1

## 2023-01-30 MED ORDER — METHOCARBAMOL 500 MG PO TABS
500.0000 mg | ORAL_TABLET | Freq: Three times a day (TID) | ORAL | Status: DC | PRN
  Administered 2023-01-31: 500 mg via ORAL
  Filled 2023-01-30: qty 1

## 2023-01-30 MED ORDER — METHYLPREDNISOLONE SODIUM SUCC 125 MG IJ SOLR
125.0000 mg | INTRAMUSCULAR | Status: AC
  Administered 2023-01-30: 125 mg via INTRAVENOUS

## 2023-01-30 MED ORDER — PHENYLEPHRINE 80 MCG/ML (10ML) SYRINGE FOR IV PUSH (FOR BLOOD PRESSURE SUPPORT)
PREFILLED_SYRINGE | INTRAVENOUS | Status: DC | PRN
  Administered 2023-01-30 (×7): 80 ug via INTRAVENOUS

## 2023-01-30 MED ORDER — ACETAMINOPHEN 500 MG PO TABS
1000.0000 mg | ORAL_TABLET | Freq: Once | ORAL | Status: AC
  Administered 2023-01-30: 1000 mg via ORAL
  Filled 2023-01-30: qty 2

## 2023-01-30 MED ORDER — DABIGATRAN ETEXILATE MESYLATE 150 MG PO CAPS
150.0000 mg | ORAL_CAPSULE | Freq: Two times a day (BID) | ORAL | Status: DC
  Administered 2023-01-30 – 2023-01-31 (×2): 150 mg via ORAL
  Filled 2023-01-30 (×4): qty 1

## 2023-01-30 MED ORDER — HEPARIN 6000 UNIT IRRIGATION SOLUTION
Status: DC | PRN
  Administered 2023-01-30: 1

## 2023-01-30 MED ORDER — PHENYLEPHRINE HCL-NACL 20-0.9 MG/250ML-% IV SOLN
INTRAVENOUS | Status: DC | PRN
  Administered 2023-01-30: 50 ug/min via INTRAVENOUS

## 2023-01-30 MED ORDER — ALPRAZOLAM 0.5 MG PO TABS
0.5000 mg | ORAL_TABLET | Freq: Two times a day (BID) | ORAL | Status: DC | PRN
  Filled 2023-01-30: qty 1

## 2023-01-30 MED ORDER — HEPARIN SODIUM (PORCINE) 1000 UNIT/ML IJ SOLN
INTRAMUSCULAR | Status: DC | PRN
  Administered 2023-01-30: 5000 [IU] via INTRAVENOUS

## 2023-01-30 MED ORDER — LIDOCAINE 2% (20 MG/ML) 5 ML SYRINGE
INTRAMUSCULAR | Status: DC | PRN
  Administered 2023-01-30: 20 mg via INTRAVENOUS

## 2023-01-30 MED ORDER — GUAIFENESIN-DM 100-10 MG/5ML PO SYRP: 15.0000 mL | ORAL_SOLUTION | ORAL | Status: DC | PRN

## 2023-01-30 MED ORDER — OXYCODONE HCL 5 MG PO TABS
5.0000 mg | ORAL_TABLET | ORAL | Status: DC | PRN
  Administered 2023-01-30 – 2023-01-31 (×4): 5 mg via ORAL
  Filled 2023-01-30 (×5): qty 1

## 2023-01-30 MED ORDER — FENTANYL CITRATE (PF) 250 MCG/5ML IJ SOLN
INTRAMUSCULAR | Status: DC | PRN
  Administered 2023-01-30 (×3): 50 ug via INTRAVENOUS

## 2023-01-30 MED ORDER — ORAL CARE MOUTH RINSE
15.0000 mL | Freq: Once | OROMUCOSAL | Status: AC
  Administered 2023-01-30: 15 mL via OROMUCOSAL

## 2023-01-30 SURGICAL SUPPLY — 46 items
ADH SKN CLS APL DERMABOND .7 (GAUZE/BANDAGES/DRESSINGS) ×1
APL PRP STRL LF DISP 70% ISPRP (MISCELLANEOUS)
BAG COUNTER SPONGE SURGICOUNT (BAG) IMPLANT
BAG SPNG CNTER NS LX DISP (BAG)
BALLN ATLAS 14X40X75 (BALLOONS) ×1
BALLN MUSTANG 10X80X75 (BALLOONS) ×1
BALLN MUSTANG 6X100X135 (BALLOONS) ×1
BALLOON ATLAS 14X40X75 (BALLOONS) IMPLANT
BALLOON MUSTANG 10X80X75 (BALLOONS) IMPLANT
BALLOON MUSTANG 6X100X135 (BALLOONS) IMPLANT
BNDG CMPR MED 15X6 ELC VLCR LF (GAUZE/BANDAGES/DRESSINGS) ×1
BNDG ELASTIC 6X15 VLCR STRL LF (GAUZE/BANDAGES/DRESSINGS) IMPLANT
CANISTER PENUMBRA ENGINE (MISCELLANEOUS) IMPLANT
CANISTER SUCT 3000ML PPV (MISCELLANEOUS) IMPLANT
CATH BEACON 5 .035 65 KMP TIP (CATHETERS) IMPLANT
CATH LIGHTNI FLASH 16XTORQ 100 (CATHETERS) IMPLANT
CATH LIGHTNING FLASH XTORQ 100 (CATHETERS) ×1
CATH VISIONS PV .035 IVUS (CATHETERS) IMPLANT
CHLORAPREP W/TINT 26 (MISCELLANEOUS) ×1 IMPLANT
COUNTER NDL 20CT MAGNET RED (NEEDLE) IMPLANT
COVER MAYO STAND STRL (DRAPES) ×1 IMPLANT
DERMABOND ADVANCED .7 DNX12 (GAUZE/BANDAGES/DRESSINGS) IMPLANT
GAUZE 4X4 16PLY ~~LOC~~+RFID DBL (SPONGE) IMPLANT
GLIDEWIRE ADV .035X260CM (WIRE) IMPLANT
GLOVE INDICATOR 8.0 STRL GRN (GLOVE) ×1 IMPLANT
GOWN STRL REUS W/ TWL LRG LVL3 (GOWN DISPOSABLE) ×2 IMPLANT
GOWN STRL REUS W/TWL 2XL LVL3 (GOWN DISPOSABLE) ×2 IMPLANT
GOWN STRL REUS W/TWL LRG LVL3 (GOWN DISPOSABLE) ×1
KIT BASIN OR (CUSTOM PROCEDURE TRAY) ×1 IMPLANT
KIT ENCORE 26 ADVANTAGE (KITS) IMPLANT
NDL HYPO 25GX1X1/2 BEV (NEEDLE) IMPLANT
NEEDLE HYPO 25GX1X1/2 BEV (NEEDLE) IMPLANT
NS IRRIG 1000ML POUR BTL (IV SOLUTION) ×1 IMPLANT
PACK ENDO MINOR (CUSTOM PROCEDURE TRAY) ×1 IMPLANT
PAD ARMBOARD 7.5X6 YLW CONV (MISCELLANEOUS) ×2 IMPLANT
SET MICROPUNCTURE 5F STIFF (MISCELLANEOUS) ×1 IMPLANT
SHEATH DRYSEAL FLEX 16FR 33CM (SHEATH) IMPLANT
SHEATH PINNACLE 8F 10CM (SHEATH) IMPLANT
SLEEVE ISOL F/PACE RF HD COVER (MISCELLANEOUS) IMPLANT
SPONGE T-LAP 18X18 ~~LOC~~+RFID (SPONGE) IMPLANT
SUT MNCRL AB 4-0 PS2 18 (SUTURE) IMPLANT
TAPE STRIPS DRAPE STRL (GAUZE/BANDAGES/DRESSINGS) IMPLANT
TOWEL GREEN STERILE (TOWEL DISPOSABLE) ×1 IMPLANT
TOWEL GREEN STERILE FF (TOWEL DISPOSABLE) IMPLANT
WATER STERILE IRR 1000ML POUR (IV SOLUTION) IMPLANT
WIRE TORQFLEX AUST .018X40CM (WIRE) IMPLANT

## 2023-01-30 NOTE — Anesthesia Postprocedure Evaluation (Signed)
Anesthesia Post Note  Patient: Amber Carroll  Procedure(s) Performed: PECUTANEOUS ACCESS LEFT FEMORAL VEIN, PERCUTANEOUS SUCTION  PENUMBRA THROMBECTOMY ILIAC AND COMMON FEMORAL VEIN, BALLOON VENOPLASTY COMMON ILIAC, EXTERNAL ILIAC , COMMONEXTERNAL ILIAC AND FEMORAL VEIN (Left: Leg Upper)     Patient location during evaluation: PACU Anesthesia Type: General Level of consciousness: awake and alert, patient cooperative and oriented Pain management: pain level controlled Vital Signs Assessment: post-procedure vital signs reviewed and stable Respiratory status: spontaneous breathing, nonlabored ventilation and respiratory function stable Cardiovascular status: blood pressure returned to baseline and stable Postop Assessment: no apparent nausea or vomiting Anesthetic complications: no Comments: Receiving 1u PRBCs   No notable events documented.  Last Vitals:  Vitals:   01/30/23 1645 01/30/23 1708  BP: (!) 88/55 (!) 92/52  Pulse: 71 66  Resp: 14 15  Temp:  36.8 C  SpO2: 97% 97%    Last Pain:  Vitals:   01/30/23 1708  TempSrc: Oral  PainSc:                  Yobany Vroom,E. Atif Chapple

## 2023-01-30 NOTE — Anesthesia Procedure Notes (Signed)
Arterial Line Insertion Start/End7/31/2024 3:29 PM, 01/30/2023 3:30 PM Performed by: Jairo Ben, MD, anesthesiologist  Patient location: OR. Preanesthetic checklist: patient identified, IV checked, site marked, risks and benefits discussed, surgical consent, monitors and equipment checked, pre-op evaluation, timeout performed and anesthesia consent Patient sedated radial was placed Catheter size: 22 G Hand hygiene performed   Attempts: 1 Procedure performed without using ultrasound guided technique. Following insertion, dressing applied and Biopatch. Post procedure assessment: normal  Patient tolerated the procedure well with no immediate complications.

## 2023-01-30 NOTE — Op Note (Signed)
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  Ultrasound-guided micropuncture access of the left femoral vein 2.  Ilio cavogram 3.  Left lower extremity venogram 4.  Percutaneous mechanical thrombectomy 16 French penumbra left common iliac vein, left external iliac vein, common femoral vein 5.  Venoplasty 14 x 40 mm Atlas left common iliac vein, external iliac vein, common femoral vein 6.  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  She has had multiple reintervention's for lysis status post occlusion.  She continues to smoke.  She presented to our office with a 5-day history of left lower extremity pain.  Stents were found to be occluded after recent intervention earlier this month.  After discussing the risk and benefits of percutaneous mechanical thrombectomy, venoplasty in an effort to reestablish flow, Amber Carroll elected to proceed.  I had an honest conversation with Amber Carroll regarding the above, namely continued interventions.  She has had several over the last 18 months, and non appear to provide significant durability.  I am concerned there is significant mismatch in flow, namely inflow into the stents is limited, therefore leading to thrombosis.  While she does have pain, on physical exam, she has no significant swelling.  I think she has made numerous collaterals, which will likely be what she will need to rely on moving forward should reintervention occlude again.  Findings:  Widely patent inferior vena cava, right common iliac vein. Stent  from the inferior vena cava through the left common femoral vein.  All occluded.  Significant collaterals appreciated.  Large greater saphenous vein.  Diminutive femoral vein.  Patent deep femoral vein appreciated at its confluence in the common femoral vein.   Procedure: Patient was brought to the OR laid in supine position.  General anesthesia was used and patient was prepped and draped in standard fashion.  The case began with ultrasound insonation of the left femoral vein.  As noted previously, the popliteal vein had no inline flow to the femoral vein, and reached via collaterals.    The femoral vein was accessed in the mid thigh.  A wire was run into the left common femoral vein and a micropuncture followed.  Left lower extremity venogram followed through the micropuncture demonstrating a small, diminutive femoral vein, which led to the common femoral vein.  The common femoral vein was opened at its distal point with flow appreciated from the profunda, as well as flow entering from the greater saphenous vein.  This filled collaterals into the pelvis reaching the IVC and azygous veins.  Previous left iliac venous stenting was all occluded.  I elected to attempt percutaneous thrombectomy, therefore a wire was run into the right subclavian vein.  Patient was heparinized.  Over this, a 55 French sheath was placed.  In an effort to get the sheath to the common femoral vein, a 6 mm balloon was used to venoplasty the femoral vein.  There was an area of significant stenosis which improved with venoplasty, and the sheath was delivered next, the 16 French penumbra device was brought onto the field.  Several passes were made with removal of both acute  and chronic thrombus within the previously placed stents.  In all, there was roughly 800 mL of blood loss.  Follow-up angiography demonstrated significant improvement with the flow lumen.  I then used a 10 mm balloon to venoplasty the entirety of the stents followed by a  14 mm x 40 mm Atlas balloon.  On repeat imaging, the left-sided iliac stents were widely patent, however inflow into the stents was sluggish as the femoral vein was small.  There appeared to be a small perforation in the femoral vein.  The stents were provided blood flow mainly through the greater saphenous vein and profunda vein.   At this point, I elected to terminate the case.  A Monocryl suture was placed to manual pressure held.  The leg was then wrapped with an Ace.  Impression: Successful recanalization of previously placed left iliac venous stents.  I think the stents occluded due to poor inflow, which cannot be improved without fistulization or other open surgery.  I had a long conversation with her parents, and plan to have a long conversation with her regarding my findings.  Being that she is relatively asymptomatic, with no asymmetric swelling, should the stents occlude again, I do not plan on offering any further intervention as collateral vessels were large and extensive.  Clot burden was chronic, and likely had been present for a number of weeks.   Amber Olden, MD [facility] [place] Office: [phone]

## 2023-01-30 NOTE — Anesthesia Procedure Notes (Signed)
Procedure Name: Intubation Date/Time: 01/30/2023 3:24 PM  Performed by: Jairo Ben, MDPre-anesthesia Checklist: Patient identified, Emergency Drugs available, Suction available and Patient being monitored Patient Re-evaluated:Patient Re-evaluated prior to induction Oxygen Delivery Method: Circle system utilized Preoxygenation: Pre-oxygenation with 100% oxygen Induction Type: IV induction and Cricoid Pressure applied Laryngoscope Size: Mac and 3 Grade View: Grade I Tube type: Oral Tube size: 7.0 mm Number of attempts: 1 Airway Equipment and Method: Stylet Placement Confirmation: ETT inserted through vocal cords under direct vision, positive ETCO2 and breath sounds checked- equal and bilateral Secured at: 22 cm Tube secured with: Tape Dental Injury: Teeth and Oropharynx as per pre-operative assessment  Comments: LMA removed and pt subsequently intubated

## 2023-01-30 NOTE — Plan of Care (Signed)
  Problem: Education: Goal: Understanding of CV disease, CV risk reduction, and recovery process will improve Outcome: Progressing   Problem: Cardiovascular: Goal: Ability to achieve and maintain adequate cardiovascular perfusion will improve Outcome: Progressing   Problem: Health Behavior/Discharge Planning: Goal: Ability to safely manage health-related needs after discharge will improve Outcome: Progressing   

## 2023-01-30 NOTE — Progress Notes (Signed)
Pt arrived from ..pacu..., A/ox .4..pt denies any pain, MD aware,CCMD called. CHG bath given,no further needs at this time   

## 2023-01-30 NOTE — H&P (Signed)
Office Note   Amber Carroll seen and examined in preop holding.  No complaints. No changes to medication history or physical exam since last seen in clinic. After discussing Amber risks and benefits of left iliac vein recanalization, percutaneous mechanical thrombectomy, Amber Carroll elected to proceed.   Victorino Sparrow MD   CC:  follow up Requesting Provider:  No ref. provider found  HPI: Amber Carroll is a 37 y.o. (15-Apr-1986) female who presents with ongoing pain and worsening edema of Amber left lower extremity status post left lower extremity venous thrombectomy with penumbra and balloon angioplasty of iliac system and common femoral vein due to thrombosed iliac and femoral venous stents.  This was performed by Dr. Karin Lieu on 01/02/2023.  She noticed a sudden increase in her edema and pain in Amber left leg on Friday, 01/25/2023.  Since then she has felt tightness extending up to her proximal calf.  She has had numerous interventions with Amber first being performed in [place] several years ago.  She has been taking her Pradaxa and aspirin on daily basis.  She also wears her thigh-high compression on a daily basis.  She has known coagulopathy.  She continues to smoke on a daily basis.   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 [facility])     Past Surgical History:  Procedure Laterality Date   CORONARY ULTRASOUND/IVUS Left 08/17/2021   Procedure: Intravascular Ultrasound/IVUS;  Surgeon: Cephus Shelling, MD;  Location: [facility] INVASIVE CV LAB;  Service: Cardiovascular;  Laterality: Left;  Venous   PATENT FORAMEN OVALE(PFO) CLOSURE     PERIPHERAL VASCULAR THROMBECTOMY N/A 08/17/2021   Procedure: PERIPHERAL VASCULAR THROMBECTOMY;  Surgeon: Cephus Shelling, MD;  Location:  [facility] INVASIVE CV LAB;  Service: Cardiovascular;  Laterality: N/A;  placement of catheter   PERIPHERAL VASCULAR THROMBECTOMY N/A 08/18/2021   Procedure: LYSIS RECHECK;  Surgeon: Leonie Douglas, MD;  Location: [facility] INVASIVE CV LAB;  Service: Cardiovascular;  Laterality: N/A;   PERIPHERAL VASCULAR THROMBECTOMY N/A 05/14/2022   Procedure: PERIPHERAL VASCULAR THROMBECTOMY;  Surgeon: Maeola Harman, MD;  Location: [facility] INVASIVE CV LAB;  Service: Cardiovascular;  Laterality: N/A;   PERIPHERAL VASCULAR THROMBECTOMY N/A 01/02/2023   Procedure: PERIPHERAL VASCULAR THROMBECTOMY;  Surgeon: Victorino Sparrow, MD;  [facility] INVAS[facility] CV LAB;  Service: Cardiovascular;  Laterality: N/A;    Social History   Socioeconomic History   Marital status: Legally Separated    Spouse name: Not on file   Number of children: Not on file   Years of education: Not on file   Highest education level: Not on file  Occupational History   Not on file  Tobacco Use   Smoking status: Every Day    Current packs/day: 0.50    Average packs/day: 0.5 packs/day for 14.0 years (7.0 ttl pk-yrs)    Types: Cigarettes   Smokeless tobacco: Never  Substance and Sexual Activity   Alcohol use: No   Drug use: No   Sexual activity: Not Currently  Other Topics Concern   Not on file  Social History Narrative   Not on file   Social Determinants of Health   Financial Resource Strain: Low Risk  (06/19/2021)   Overall Financial Resource Strain (CARDIA)  Difficulty of Paying Living Expenses: Not hard at all  Food Insecurity: No Food Insecurity (01/01/2023)   Hunger Vital Sign    Worried About Running Out of Food in Amber Last Year: Never true    Ran Out of Food in Amber Last Year: Never true  Transportation Needs: No Transportation Needs (01/01/2023)   PRAPARE - Administrator, Civil Service (Medical): No    Lack of Transportation (Non-Medical): No  Physical Activity: Insufficiently Active (06/19/2021)   Exercise Vital  Sign    Days of Exercise per Week: 1 day    Minutes of Exercise per Session: 60 min  Stress: Stress Concern Present (06/19/2021)   Amber Carroll    Feeling of Stress : Rather much  Social Connections: Unknown (11/04/2021)   [facility]   Social Network    Social Network: Not on file  Intimate Partner Violence: Not At Risk (01/01/2023)   Humiliation, Afraid, Rape, and Kick Carroll    Fear of Current or Ex-Partner: No    Emotionally Abused: No    Physically Abused: No    Sexually Abused: No    Family History  Problem Relation Age of Onset   Factor V Leiden deficiency Father     Current Facility-Administered Medications  Medication Dose Route Frequency Provider Last Rate Last Admin   0.9 %  sodium chloride infusion   Intravenous Continuous Victorino Sparrow, MD       lactated ringers infusion   Intravenous Continuous Jairo Ben, MD 10 mL/hr at 01/30/23 1222 New Bag at 01/30/23 1222   methylPREDNISolone sodium succinate (SOLU-MEDROL) 125 mg/2 mL injection 125 mg  125 mg Intravenous On Call Victorino Sparrow, MD       methylPREDNISolone sodium succinate (SOLU-MEDROL) 125 mg/2 mL injection             Allergies  Allergen Reactions   Coconut (Cocos Nucifera) Anaphylaxis   Ivp Dye [Iodinated Contrast Media] Anaphylaxis and Hives   Latex Anaphylaxis   Tessalon [Benzonatate] Anaphylaxis   Yellow Dye Anaphylaxis    ONLY allergic to yellow dye in Tessalon pearls per Amber Carroll Not a food allergy    Estrogens     Developed blood clot      REVIEW OF SYSTEMS:   [X]  denotes positive finding, [ ]  denotes negative finding Cardiac  Comments:  Chest pain or chest pressure:    Shortness of breath upon exertion:    Short of breath when lying flat:    Irregular heart rhythm:        Vascular    Pain in calf, thigh, or hip brought on by ambulation:    Pain in feet at night that wakes you up from your  sleep:     Blood clot in your veins:    Leg swelling:         Pulmonary    Oxygen at home:    Productive cough:     Wheezing:         Neurologic    Sudden weakness in arms or legs:     Sudden numbness in arms or legs:     Sudden onset of difficulty speaking or slurred speech:    Temporary loss of vision in one eye:     Problems with dizziness:         Gastrointestinal    Blood in stool:     Vomited blood:  Genitourinary    Burning when urinating:     Blood in urine:        Psychiatric    Major depression:         Hematologic    Bleeding problems:    Problems with blood clotting too easily:        Skin    Rashes or ulcers:        Constitutional    Fever or chills:      PHYSICAL EXAMINATION:  Vitals:   01/30/23 1104  BP: (!) 98/57  Pulse: 75  Resp: 20  Temp: 98.6 F (37 C)  TempSrc: Oral  SpO2: 99%  Weight: 49.9 kg  Height: 5\' 3"  (1.6 m)    General:  WDWN in NAD; vital signs documented above Gait: Not observed HENT: WNL, normocephalic Pulmonary: normal non-labored breathing , without Rales, rhonchi,  wheezing Cardiac: regular HR Abdomen: soft, NT, no masses Skin: without rashes Vascular Exam/Pulses: palpable L DP; L calf soft; edema LLE compared to R Extremities: without ischemic changes, without Gangrene , without cellulitis; without open wounds;  Musculoskeletal: no muscle wasting or atrophy  Neurologic: A&O X 3 Psychiatric:  Amber Carroll has Normal affect.   Non-Invasive Vascular Imaging:    Patent IVC Acute appearing thrombus in Amber left common iliac vein and external iliac vein   ASSESSMENT/PLAN:: 37 y.o. female with history of numerous left iliofemoral venous interventions due to May Thurner syndrome  -Amber Carroll is a 37 year old female with known clotting disorder.  She has had numerous interventions starting at an outside facility in [place] several years ago due to left lower extremity DVT involving Amber iliac venous system.   Most recent intervention came on 01/02/2023.  This included mechanical thrombectomy with penumbra and balloon angioplasty due to thrombosed iliofemoral venous stents of Amber left leg.  Since that time she has been taking her Pradaxa and aspirin on a daily basis as well as wearing thigh-high compression regularly.  She first noticed an increase in edema symptoms of her left leg on Friday, 3 days ago.  Symptoms have progressed to her proximal calf.  Duplex today demonstrates thrombosed left common and external iliac venous stents.  Case was discussed with Dr. Lenell Antu who suggested mechanical thrombectomy versus lysis.  Amber Carroll is agreeable to proceed on Wednesday in [facility] with Dr. Karin Lieu.   Victorino Sparrow, PA-C [facility] [phone]  Clinic MD:   Lenell Antu

## 2023-01-30 NOTE — Anesthesia Procedure Notes (Signed)
Procedure Name: LMA Insertion Date/Time: 01/30/2023 2:41 PM  Performed by: Jairo Ben, MDPre-anesthesia Checklist: Patient identified, Emergency Drugs available, Suction available, Patient being monitored and Timeout performed Patient Re-evaluated:Patient Re-evaluated prior to induction Oxygen Delivery Method: Circle system utilized Preoxygenation: Pre-oxygenation with 100% oxygen Induction Type: IV induction Ventilation: Mask ventilation without difficulty LMA: LMA inserted LMA Size: 4.0

## 2023-01-30 NOTE — Anesthesia Preprocedure Evaluation (Addendum)
Anesthesia Evaluation  Patient identified by MRN, date of birth, ID band Patient awake    Reviewed: Allergy & Precautions, NPO status , Patient's Chart, lab work & pertinent test results  History of Anesthesia Complications Negative for: history of anesthetic complications  Airway Mallampati: II  TM Distance: >3 FB Neck ROM: Full    Dental  (+) Dental Advisory Given   Pulmonary asthma , COPD,  COPD inhaler, Current SmokerPatient did not abstain from smoking., PE   breath sounds clear to auscultation       Cardiovascular (-) angina + Peripheral Vascular Disease and + DVT  + Valvular Problems/Murmurs (h/o PFO closure)  Rhythm:Regular Rate:Normal     Neuro/Psych  Headaches  Anxiety Depression       GI/Hepatic Neg liver ROS,GERD  Controlled,,  Endo/Other  negative endocrine ROS    Renal/GU negative Renal ROS     Musculoskeletal   Abdominal   Peds  Hematology Pradaxa Protein S deficiency   Anesthesia Other Findings   Reproductive/Obstetrics                              Anesthesia Physical Anesthesia Plan  ASA: 3  Anesthesia Plan: General   Post-op Pain Management: Tylenol PO (pre-op)*   Induction: Intravenous  PONV Risk Score and Plan: 2 and Ondansetron and Dexamethasone  Airway Management Planned: LMA  Additional Equipment: None  Intra-op Plan:   Post-operative Plan:   Informed Consent: I have reviewed the patients History and Physical, chart, labs and discussed the procedure including the risks, benefits and alternatives for the proposed anesthesia with the patient or authorized representative who has indicated his/her understanding and acceptance.     Dental advisory given  Plan Discussed with: CRNA and Surgeon  Anesthesia Plan Comments:          Anesthesia Quick Evaluation

## 2023-01-30 NOTE — Transfer of Care (Signed)
Immediate Anesthesia Transfer of Care Note  Patient: Amber Carroll  Procedure(s) Performed: PECUTANEOUS ACCESS LEFT FEMORAL VEIN, PERCUTANEOUS SUCTION  PENUMBRA THROMBECTOMY ILIAC AND COMMON FEMORAL VEIN, BALLOON VENOPLASTY COMMON ILIAC, EXTERNAL ILIAC , COMMONEXTERNAL ILIAC AND FEMORAL VEIN (Left: Leg Upper)  Patient Location: PACU  Anesthesia Type:General  Level of Consciousness: awake and alert   Airway & Oxygen Therapy: Patient Spontanous Breathing  Post-op Assessment: Report given to RN and Post -op Vital signs reviewed and stable  Post vital signs: Reviewed and stable  Last Vitals:  Vitals Value Taken Time  BP 92/58 01/30/23 1621  Temp    Pulse 103 01/30/23 1624  Resp 19 01/30/23 1624  SpO2 95 % 01/30/23 1624  Vitals shown include unfiled device data.  Last Pain:  Vitals:   01/30/23 1218  TempSrc:   PainSc: 5          Complications: No notable events documented.

## 2023-01-31 ENCOUNTER — Encounter (HOSPITAL_COMMUNITY): Payer: Self-pay | Admitting: Vascular Surgery

## 2023-01-31 DIAGNOSIS — I82402 Acute embolism and thrombosis of unspecified deep veins of left lower extremity: Secondary | ICD-10-CM | POA: Diagnosis not present

## 2023-01-31 LAB — CBC
HCT: 26.4 % — ABNORMAL LOW (ref 36.0–46.0)
Hemoglobin: 8.7 g/dL — ABNORMAL LOW (ref 12.0–15.0)
MCH: 30.7 pg (ref 26.0–34.0)
MCHC: 33 g/dL (ref 30.0–36.0)
MCV: 93.3 fL (ref 80.0–100.0)
Platelets: 123 10*3/uL — ABNORMAL LOW (ref 150–400)
RBC: 2.83 MIL/uL — ABNORMAL LOW (ref 3.87–5.11)
RDW: 15.6 % — ABNORMAL HIGH (ref 11.5–15.5)
WBC: 13.1 10*3/uL — ABNORMAL HIGH (ref 4.0–10.5)
nRBC: 0.2 % (ref 0.0–0.2)

## 2023-01-31 MED ORDER — OXYCODONE HCL 5 MG PO TABS: 5.0000 mg | ORAL_TABLET | Freq: Four times a day (QID) | ORAL | 0 refills | Status: DC | PRN

## 2023-01-31 NOTE — Discharge Instructions (Signed)
  [facility] [place]  Discharge Instructions  Lower Extremity Angiogram; Angioplasty/Stenting  Please refer to the following instructions for your post-procedure care. Your surgeon or physician assistant will discuss any changes with you.  Activity  Avoid lifting more than 8 pounds (1 gallons of milk) for 5 days after your procedure. You may walk as much as you can tolerate. It's OK to drive after 72 hours.  Bathing/Showering  You may shower the day after your procedure. If you have a bandage, you may remove it at 24- 48 hours. Clean your incision site with mild soap and water. Pat the area dry with a clean towel.  Diet  Resume your pre-procedure diet. There are no special food restrictions following this procedure. All patients with peripheral vascular disease should follow a low fat/low cholesterol diet. In order to heal from your surgery, it is CRITICAL to get adequate nutrition. Your body requires vitamins, minerals, and protein. Vegetables are the best source of vitamins and minerals. Vegetables also provide the perfect balance of protein. Processed food has little nutritional value, so try to avoid this.  Medications  Resume taking all of your medications unless your doctor tells you not to. If your incision is causing pain, you may take over-the-counter pain relievers such as acetaminophen (Tylenol)  Follow Up  Follow up will be arranged at the time of your procedure. You may have an office visit scheduled or may be scheduled for surgery. Ask your surgeon if you have any questions.  Please call us immediately for any of the following conditions: .Severe or worsening pain your legs or feet at rest or with walking. .Increased pain, redness, drainage at your groin puncture site. .Fever of 101 degrees or higher. .If you have any mild or slow bleeding from your puncture site: lie down, apply firm constant pressure over the area with a piece of gauze or a  clean wash cloth for 30 minutes- no peeking!, call 911 right away if you are still bleeding after 30 minutes, or if the bleeding is heavy and unmanageable.  Reduce your risk factors of vascular disease:  . Stop smoking. If you would like help call [facility] at 1-800-QUIT-NOW (1-800-784-8669) or [place] at 336-586-4000. . Manage your cholesterol . Maintain a desired weight . Control your diabetes . Keep your blood pressure down .  If you have any questions, please call the office at [phone] 

## 2023-01-31 NOTE — Progress Notes (Signed)
  Daily Progress Note  S/p:  1.  Ultrasound-guided micropuncture access of the left femoral vein 2.  Ilio cavogram 3.  Left lower extremity venogram 4.  Percutaneous mechanical thrombectomy 16 French penumbra left common iliac vein, left external iliac vein, common femoral vein 5.  Venoplasty 14 x 40 mm Atlas left common iliac vein, external iliac vein, common femoral vein 6.  Venoplasty 6 x 100 femoral vein  Subjective: Pain at the left knee this morning  Objective: Vitals:   01/31/23 0319 01/31/23 0812  BP: 93/61 91/67  Pulse: 67 65  Resp: 18 15  Temp: 98.4 F (36.9 C) 98.4 F (36.9 C)  SpO2: 100% 99%    Physical Examination Leg wrapped, excellent pulse No hematoma No asymmetric swelling   ASSESSMENT/PLAN:  Patient status post percutaneous thrombectomy of left-sided iliac venous stents for reocclusion.  The stents have occluded several times.  I had an honest conversation with Amber Carroll regarding her left lower extremity, right-sided iliac vein stents, and overall prognosis.  Amber Carroll asked why this continues to happen, and we spent a significant amount of time discussing the importance of inflow into her stents.  The stents were placed in [place] at [facility].  At that time, she was told the stents would occlude if she continues to smoke which inevitably occurred.  At the time of initial venogram yesterday, she had significant collateralization into the pelvis from the left lower extremity.  She had poor inflow into the stents as the femoral vein was occluded.  The majority of the leg was draining through the greater saphenous vein -the confluence of which was covered by the stent, and to the deep femoral vein.  I think the reason the stent continues to fail is due to poor inflow.  While open procedures can be done such as fistula creation, Amber Carroll's left lower extremity demonstrates no significant edema.  It is soft.  I opened the stents once again, but told  Amber Carroll that should it re-occlude, there would be no further intervention as she has built the necessary collaterals to live with left-sided common iliac vein, external iliac vein occlusion.  Amber Carroll was tearful during our discussion.  I think that regardless of stent patency, she should be able to live a full, pain-free life.  I asked that she continue her current anticoagulation regimen lifelong.  She asked if the new collaterals will thrombosis, to which I stated if she stopped smoking, and continued anticoagulation the odds of this occurring would be exceedingly low. At 37 years old, we discussed the importance of being active, and smoking cessation.     Plan for discharge later today v tomorrow.  Repeat CBC lunch     Fara Olden MD MS [facility] [phone] 01/31/2023  10:43 AM

## 2023-01-31 NOTE — Progress Notes (Signed)
Pt/family given discharge instructions, medication lists, follow up appointments, and when to call the doctor.  Pt/family verbalizes understanding.  Telemetry removed, CCMD called, IVs removed and pt transported to main entrance via wheelchair. Thomas Hoff, RN

## 2023-02-05 NOTE — Discharge Summary (Signed)
Discharge Summary  Patient ID: Amber Carroll [id number] 37 y.o. 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  She has had multiple reintervention's for lysis status post occlusion.  She continues to smoke.  She presented to our office with a 5-day history of left lower extremity pain.  Stents were found to be occluded after recent intervention earlier this month.  After discussing the risk and benefits of percutaneous mechanical thrombectomy, venoplasty in an effort to reestablish flow, Parris elected to proceed.   I had an honest conversation with Reuel Boom regarding the above, namely continued interventions.  She has had several over the last 18 months, and non appear to provide significant durability.  I am concerned there is significant mismatch in flow, namely inflow into the stents is limited, therefore leading to thrombosis.  While she does have pain, on physical exam, she has no significant swelling.  I think she has made numerous collaterals, which will likely be what she will need to rely on moving forward should reintervention occlude again.  Hospital Course: Ms. Legleiter was admitted on 01/30/23 and underwent 1.  Ultrasound-guided micropuncture access of the left femoral vein 2.  Ilio cavogram 3.   Left lower extremity venogram 4.  Percutaneous mechanical thrombectomy 16 French penumbra left common iliac vein, left external iliac vein, common femoral vein 5.  Venoplasty 14 x 40 mm Atlas left common iliac vein, external iliac vein, common femoral vein 6.  Venoplasty 6 x 100 femoral vein by Dr. Karin Lieu  She tolerated the procedure well and was taken to the recovery in stable condition.  POD#1, continued pain in left lower extremity but left leg remained well perfused. Femoral vein access site without any swelling or hematoma. Dr. Karin Lieu had a long discussion with her regarding nature of her venous disease. At the time of initial venogram, she had significant collateralization into the pelvis from the left lower extremity.  She had poor inflow into the stents as the femoral vein was occluded.  The majority of the leg was draining through the greater saphenous vein -the confluence of which was covered by the stent, and to the deep femoral vein.  I think the reason the stent continues to fail is due to poor inflow.  While open procedures can be done such as fistula creation, Celisa's left lower extremity demonstrates no significant edema.  It is soft.  Dr. Karin Lieu told Ernesto that should it re-occlude, there would be no further intervention as she has built the necessary collaterals to live with left-sided common iliac vein, external iliac vein occlusion. Had a long discussion with her about the importance of smoking cessation.She tolerated ambulating and encouraged her to continue to remain active. She did require transfusion of 2 units and her post procedural hemoglobin remained stable for discharge. She will discharge on her Pradaxa which she will remain on lifelong. Percocet #30 no refills was sent to  her requested pharmacy. She is aware that after this prescription we will not be able to provide her with any further pain medication. She will discharge home with follow up in our office in 1 month.  At  that time we will likely arrange follow up with provider in the [facility] as that is where she is currently living.  Consults: None  Treatments: analgesia: acetaminophen, Morphine, and Oxycodone, anticoagulation: LMW heparin and Dabigatran, therapies: RN, and surgery:  1.  Ultrasound-guided micropuncture access of the left femoral vein 2.  Ilio cavogram 3.  Left lower extremity venogram 4.  Percutaneous mechanical thrombectomy 16 French penumbra left common iliac vein, left external iliac vein, common femoral vein 5.  Venoplasty 14 x 40 mm Atlas left common iliac vein, external iliac vein, common femoral vein 6.  Venoplasty 6 x 100 femoral vein  Disposition: Discharge disposition: 01-Home or Self Care       Patient Instructions:  Allergies as of 01/31/2023       Reactions   Coconut (cocos Nucifera) Anaphylaxis   Ivp Dye [iodinated Contrast Media] Anaphylaxis, Hives   Latex Anaphylaxis   Tessalon [benzonatate] Anaphylaxis   Yellow Dye Anaphylaxis   ONLY allergic to yellow dye in Tessalon pearls per patient Not a food allergy   Estrogens    Developed blood clot         Medication List     TAKE these medications    acetaminophen 325 MG tablet Commonly known as: TYLENOL Take 2 tablets (650 mg total) by mouth every 4 (four) hours as needed for headache or mild pain.   albuterol 108 (90 Base) MCG/ACT inhaler Commonly known as: VENTOLIN HFA Inhale 2 puffs into the lungs every 6 (six) hours as needed for wheezing or shortness of breath.   ALPRAZolam 0.5 MG tablet Commonly known as: XANAX Take 0.5 mg by mouth 2 (two) times daily as needed for anxiety or sleep.   aspirin EC 81 MG tablet Take 1 tablet (81 mg total) by mouth daily. Swallow whole. What changed: when to take this   dabigatran 150 MG Caps capsule Commonly known as: Pradaxa Take 1 capsule (150 mg total) by mouth 2 (two) times daily.   levocetirizine 5 MG tablet Commonly known as: XYZAL Take 5 mg by  mouth at bedtime as needed for allergies.   methocarbamol 500 MG tablet Commonly known as: ROBAXIN Take 1 tablet (500 mg total) by mouth every 8 (eight) hours as needed for muscle spasms.   midodrine 5 MG tablet Commonly known as: PROAMATINE Take 1 tablet (5 mg total) by mouth 3 (three) times daily with meals.   montelukast 10 MG tablet Commonly known as: SINGULAIR Take 10 mg by mouth daily as needed (allergies).   oxyCODONE 5 MG immediate release tablet Commonly known as: Oxy IR/ROXICODONE Take 1 tablet (5 mg total) by mouth every 6 (six) hours as needed for severe pain. What changed:  when to take this reasons to take this       Activity: activity as tolerated, no driving while on analgesics, and no heavy lifting for 2 weeks Diet: regular diet Wound Care: keep wound clean and dry  Follow-up with [facility] in 1 month.  SignedGraceann Congress, PA-C 02/05/2023 8:46 AM [facility] Office: [phone]

## 2023-02-15 ENCOUNTER — Encounter (HOSPITAL_COMMUNITY): Payer: 59

## 2023-03-08 ENCOUNTER — Ambulatory Visit (INDEPENDENT_AMBULATORY_CARE_PROVIDER_SITE_OTHER): Payer: 59 | Admitting: Physician Assistant

## 2023-03-08 ENCOUNTER — Encounter: Payer: Self-pay | Admitting: Physician Assistant

## 2023-03-08 VITALS — BP 116/77 | HR 88 | Temp 98.3°F | Wt 106.0 lb

## 2023-03-08 DIAGNOSIS — I871 Compression of vein: Secondary | ICD-10-CM

## 2023-03-08 NOTE — Progress Notes (Signed)
Office Note     CC:  follow up Requesting Provider:  Audery Amel, MD  HPI: Amber Carroll is a 37 y.o. (September 15, 1985) female who presents status post venogram with percutaneous mechanical thrombectomy and venoplasty of the left common iliac vein, external iliac vein, common femoral vein by Dr. Sherral Hammers on 01/30/2023 due to recurrent thrombosis of iliac stents.  Today she is without swelling in her left leg and also without pain.  She has since moved to [place] where she has established with a new vascular surgeon and hematologist.  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  She is also considering switching from Pradaxa to lifelong Lovenox injections based on recommendations from her new hematologist.   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 [facility])     Past Surgical History:  Procedure Laterality Date   CORONARY ULTRASOUND/IVUS Left 08/17/2021   Procedure: Intravascular Ultrasound/IVUS;  Surgeon: Cephus Shelling, MD;  Location: [facility] INVASIVE CV LAB;  Service: Cardiovascular;  Laterality: Left;  Venous   PATENT FORAMEN OVALE(PFO) CLOSURE     PERIPHERAL VASCULAR THROMBECTOMY N/A 08/17/2021   Procedure: PERIPHERAL VASCULAR THROMBECTOMY;  Surgeon: Cephus Shelling, MD;  Location: [facility] INVASIVE CV LAB;  Service: Cardiovascular;  Laterality: N/A;  placement of catheter   PERIPHERAL VASCULAR THROMBECTOMY N/A 08/18/2021   Procedure: LYSIS RECHECK;  Surgeon: Leonie Douglas, MD;  Location: [facility] INVASIVE CV LAB;  Service: Cardiovascular;  Laterality: N/A;   PERIPHERAL VASCULAR THROMBECTOMY N/A 05/14/2022   Procedure: PERIPHERAL VASCULAR  THROMBECTOMY;  Surgeon: Maeola Harman, MD;  Location: [facility] INVASIVE CV LAB;  Service: Cardiovascular;  Laterality: N/A;   PERIPHERAL VASCULAR THROMBECTOMY N/A 01/02/2023   Procedure: PERIPHERAL VASCULAR THROMBECTOMY;  Surgeon: Victorino Sparrow, MD;  Location: [facility] INVASIVE CV LAB;  Service: Cardiovascular;  Laterality: N/A;   THROMBECTOMY ILIAC ARTERY Left 01/30/2023   Procedure: PECUTANEOUS ACCESS LEFT FEMORAL VEIN, PERCUTANEOUS SUCTION  PENUMBRA THROMBECTOMY ILIAC AND COMMON FEMORAL VEIN, BALLOON VENOPLASTY COMMON ILIAC, EXTERNAL ILIAC , COMMONEXTERNAL ILIAC AND FEMORAL VEIN;  Surgeon: Victorino Sparrow, MD;  Location: [facility] OR;  Service: Vascular;  Laterality: Left;    Social History   Socioeconomic History   Marital status: Legally Separated    Spouse name: Not on file   Number of children: Not on file   Years of education: Not on file   Highest education level: Not on file  Occupational History   Not on file  Tobacco Use   Smoking status: Every Day    Current packs/day: 0.50    Average packs/day: 0.5 packs/day for 14.0 years (7.0 ttl pk-yrs)    Types: Cigarettes   Smokeless tobacco: Never  Substance and Sexual Activity   Alcohol use: No   Drug use: No   Sexual activity: Not Currently  Other Topics Concern   Not on file  Social History Narrative   Not on file   Social Determinants of Health   Financial Resource Strain: Low Risk  (06/19/2021)   Overall Financial Resource Strain (CARDIA)    Difficulty of Paying Living Expenses: Not hard at all  Food  Insecurity: No Food Insecurity (01/30/2023)   Hunger Vital Sign    Worried About Running Out of Food in the Last Year: Never true    Ran Out of Food in the Last Year: Never true  Transportation Needs: No Transportation Needs (01/30/2023)   PRAPARE - Administrator, Civil Service (Medical): No    Lack of Transportation (Non-Medical): No  Physical Activity: Insufficiently Active (06/19/2021)   Exercise Vital Sign     Days of Exercise per Week: 1 day    Minutes of Exercise per Session: 60 min  Stress: Stress Concern Present (06/19/2021)   [employer] of Occupational Health - Occupational Stress Questionnaire    Feeling of Stress : Rather much  Social Connections: Unknown (11/04/2021)   [facility]   Social Network    Social Network: Not on file  Intimate Partner Violence: Not At Risk (01/30/2023)   Humiliation, Afraid, Rape, and Kick questionnaire    Fear of Current or Ex-Partner: No    Emotionally Abused: No    Physically Abused: No    Sexually Abused: No    Family History  Problem Relation Age of Onset   Factor V Leiden deficiency Father     Current Outpatient Medications  Medication Sig Dispense Refill   acetaminophen (TYLENOL) 325 MG tablet Take 2 tablets (650 mg total) by mouth every 4 (four) hours as needed for headache or mild pain.     albuterol (VENTOLIN HFA) 108 (90 Base) MCG/ACT inhaler Inhale 2 puffs into the lungs every 6 (six) hours as needed for wheezing or shortness of breath.     ALPRAZolam (XANAX) 0.5 MG tablet Take 0.5 mg by mouth 2 (two) times daily as needed for anxiety or sleep.     dabigatran (PRADAXA) 150 MG CAPS capsule Take 1 capsule (150 mg total) by mouth 2 (two) times daily. 60 capsule 6   levocetirizine (XYZAL) 5 MG tablet Take 5 mg by mouth at bedtime as needed for allergies.     montelukast (SINGULAIR) 10 MG tablet Take 10 mg by mouth daily as needed (allergies).     No current facility-administered medications for this visit.    Allergies  Allergen Reactions   Coconut (Cocos Nucifera) Anaphylaxis   Ivp Dye [Iodinated Contrast Media] Anaphylaxis and Hives   Latex Anaphylaxis   Tessalon [Benzonatate] Anaphylaxis   Yellow Dye Anaphylaxis    ONLY allergic to yellow dye in Tessalon pearls per patient Not a food allergy    Estrogens     Developed blood clot      REVIEW OF SYSTEMS:   [X]  denotes positive finding, [ ]   denotes negative finding Cardiac  Comments:  Chest pain or chest pressure:    Shortness of breath upon exertion:    Short of breath when lying flat:    Irregular heart rhythm:        Vascular    Pain in calf, thigh, or hip brought on by ambulation:    Pain in feet at night that wakes you up from your sleep:     Blood clot in your veins:    Leg swelling:         Pulmonary    Oxygen at home:    Productive cough:     Wheezing:         Neurologic    Sudden weakness in arms or legs:     Sudden numbness in arms or legs:     Sudden onset  of difficulty speaking or slurred speech:    Temporary loss of vision in one eye:     Problems with dizziness:         Gastrointestinal    Blood in stool:     Vomited blood:         Genitourinary    Burning when urinating:     Blood in urine:        Psychiatric    Major depression:         Hematologic    Bleeding problems:    Problems with blood clotting too easily:        Skin    Rashes or ulcers:        Constitutional    Fever or chills:      PHYSICAL EXAMINATION:  Vitals:   03/08/23 1049  BP: 116/77  Pulse: 88  Temp: 98.3 F (36.8 C)  TempSrc: Temporal  SpO2: 98%  Weight: 106 lb (48.1 kg)    General:  WDWN in NAD; vital signs documented above Gait: Not observed HENT: WNL, normocephalic Pulmonary: normal non-labored breathing , without Rales, rhonchi,  wheezing Cardiac: regular HR Abdomen: soft, NT, no masses Skin: without rashes Vascular Exam/Pulses: palpable DP pulses Extremities: without ischemic changes, without Gangrene , without cellulitis; without open wounds;  Musculoskeletal: no muscle wasting or atrophy  Neurologic: A&O X 3 Psychiatric:  The pt has Normal affect.   Non-Invasive Vascular Imaging:   None    ASSESSMENT/PLAN:: 37 y.o. female status post percutaneous mechanical thrombectomy and venoplasty of left common iliac vein, external iliac vein, and common femoral vein due to recurrent thrombosis  of iliac stents.   Ms. Dusty Lee is a 37 year old female who has had numerous percutaneous interventions related to May Thurner syndrome.  Since her last procedure at the end of July she is asymptomatic without any significant edema of the left leg or pain.  She is aware her options are limited should her iliac venous system rethrombosis.  We would not offer any further interventions especially if she is asymptomatic.  We also discussed the importance of living an active lifestyle to promote collateral venous return.  She can wear compression if she is bothered by edema in the future.  She should do her best to avoid prolonged sitting and standing and should also elevate her legs periodically throughout the day when possible.  She has establish care in [place] with a new vascular surgeon and hematologist.  She will let us know if she needs help transferring her records.  For now she will follow-up on an as-needed basis.   Emilie Rutter, PA-C [facility] [phone]  Clinic MD:   Karin Lieu

## 2023-04-16 ENCOUNTER — Other Ambulatory Visit: Payer: Self-pay | Admitting: Physician Assistant

## 2023-04-16 DIAGNOSIS — D500 Iron deficiency anemia secondary to blood loss (chronic): Secondary | ICD-10-CM

## 2023-04-16 DIAGNOSIS — I8290 Acute embolism and thrombosis of unspecified vein: Secondary | ICD-10-CM

## 2023-04-17 ENCOUNTER — Other Ambulatory Visit: Payer: Self-pay

## 2023-04-17 ENCOUNTER — Inpatient Hospital Stay (HOSPITAL_BASED_OUTPATIENT_CLINIC_OR_DEPARTMENT_OTHER): Payer: 59 | Admitting: Physician Assistant

## 2023-04-17 ENCOUNTER — Telehealth: Payer: Self-pay

## 2023-04-17 ENCOUNTER — Inpatient Hospital Stay: Payer: 59 | Attending: Physician Assistant

## 2023-04-17 VITALS — BP 101/75 | HR 87 | Temp 98.1°F | Resp 18 | Ht 63.0 in | Wt 110.4 lb

## 2023-04-17 DIAGNOSIS — D500 Iron deficiency anemia secondary to blood loss (chronic): Secondary | ICD-10-CM

## 2023-04-17 DIAGNOSIS — I8290 Acute embolism and thrombosis of unspecified vein: Secondary | ICD-10-CM

## 2023-04-17 DIAGNOSIS — I82412 Acute embolism and thrombosis of left femoral vein: Secondary | ICD-10-CM

## 2023-04-17 DIAGNOSIS — F17210 Nicotine dependence, cigarettes, uncomplicated: Secondary | ICD-10-CM | POA: Insufficient documentation

## 2023-04-17 DIAGNOSIS — Z86718 Personal history of other venous thrombosis and embolism: Secondary | ICD-10-CM | POA: Insufficient documentation

## 2023-04-17 DIAGNOSIS — I82409 Acute embolism and thrombosis of unspecified deep veins of unspecified lower extremity: Secondary | ICD-10-CM

## 2023-04-17 DIAGNOSIS — Z7901 Long term (current) use of anticoagulants: Secondary | ICD-10-CM | POA: Diagnosis not present

## 2023-04-17 LAB — CBC WITH DIFFERENTIAL (CANCER CENTER ONLY)
Abs Immature Granulocytes: 0.02 10*3/uL (ref 0.00–0.07)
Basophils Absolute: 0 10*3/uL (ref 0.0–0.1)
Basophils Relative: 1 %
Eosinophils Absolute: 0.1 10*3/uL (ref 0.0–0.5)
Eosinophils Relative: 2 %
HCT: 41.5 % (ref 36.0–46.0)
Hemoglobin: 13.2 g/dL (ref 12.0–15.0)
Immature Granulocytes: 0 %
Lymphocytes Relative: 28 %
Lymphs Abs: 2 10*3/uL (ref 0.7–4.0)
MCH: 30.3 pg (ref 26.0–34.0)
MCHC: 31.8 g/dL (ref 30.0–36.0)
MCV: 95.4 fL (ref 80.0–100.0)
Monocytes Absolute: 0.4 10*3/uL (ref 0.1–1.0)
Monocytes Relative: 6 %
Neutro Abs: 4.4 10*3/uL (ref 1.7–7.7)
Neutrophils Relative %: 63 %
Platelet Count: 186 10*3/uL (ref 150–400)
RBC: 4.35 MIL/uL (ref 3.87–5.11)
RDW: 13.2 % (ref 11.5–15.5)
WBC Count: 6.9 10*3/uL (ref 4.0–10.5)
nRBC: 0 % (ref 0.0–0.2)

## 2023-04-17 LAB — CMP (CANCER CENTER ONLY)
ALT: 14 U/L (ref 0–44)
AST: 17 U/L (ref 15–41)
Albumin: 4.2 g/dL (ref 3.5–5.0)
Alkaline Phosphatase: 61 U/L (ref 38–126)
Anion gap: 10 (ref 5–15)
BUN: 10 mg/dL (ref 6–20)
CO2: 26 mmol/L (ref 22–32)
Calcium: 9.3 mg/dL (ref 8.9–10.3)
Chloride: 104 mmol/L (ref 98–111)
Creatinine: 0.79 mg/dL (ref 0.44–1.00)
GFR, Estimated: >60 mL/min (ref >60)
Glucose, Bld: 76 mg/dL (ref 70–99)
Potassium: 3.7 mmol/L (ref 3.5–5.1)
Sodium: 140 mmol/L (ref 135–145)
Total Bilirubin: 0.4 mg/dL (ref 0.3–1.2)
Total Protein: 8.2 g/dL — ABNORMAL HIGH (ref 6.5–8.1)

## 2023-04-17 LAB — IRON AND IRON BINDING CAPACITY (CC-WL,HP ONLY)
Iron: 55 ug/dL (ref 28–170)
Saturation Ratios: 13 % (ref 10.4–31.8)
TIBC: 413 ug/dL (ref 250–450)
UIBC: 358 ug/dL (ref 148–442)

## 2023-04-17 LAB — FERRITIN: Ferritin: 22 ng/mL (ref 11–307)

## 2023-04-17 MED ORDER — DABIGATRAN ETEXILATE MESYLATE 150 MG PO CAPS: 150.0000 mg | ORAL_CAPSULE | Freq: Two times a day (BID) | ORAL | 6 refills | Status: DC

## 2023-04-17 NOTE — Telephone Encounter (Signed)
Referral to [facility] for DVT faxed and confirmation received

## 2023-04-17 NOTE — Progress Notes (Signed)
Patient Care Team: Toppin, [name], MD as PCP - General (Internal Medicine) [name], MD as Consulting Physician (Hematology and Oncology) Leonie Douglas, MD as Consulting Physician (Vascular Surgery) Raymondo Band as Physician Assistant (Hematology and Oncology)  DIAGNOSIS:  Encounter Diagnoses  Name Primary?   Recurrent deep vein thrombosis (DVT) (HCC) Yes   Iron deficiency anemia due to chronic blood loss      CHIEF COMPLIANT: Recurrent DVT   CURRENT TREATMENT: Indefinite anticoagulation with Pradaxa   INTERVAL HISTORY: Amber Carroll is a 37 y.o. female who returns for a follow up visit. She was last seen on 10/16/2022. In the interim, she was admitted from 12/31/2022-01/05/2023 for complete occlusion of her left iliac caval venous stent while she held her Pradaxa therapy for 4 days due to dental procedure. She underwent percutaneous mechanical thrombectomy and balloon venoplasty to the iliac system and common femoral vein. On 01/30/2023, she underwent percutaneous mechanical thrombectomy of the left common iliac vein, left external iliac vein, common femoral vein followed by venoplasty of left common iliac vein, external iliac vein, common femoral vein and  femoral vein.   Amber Carroll reports she is tolerating Pradaxa therapy without any issues. Her left lower extremity does swell and cause pain when she stands for long periods. She denies easy bruising or signs of active bleeding. Patient is otherwise feeling well without any concerning symptoms.  She denies fevers, chills, night sweats, shortness of breath, chest pain, cough, nausea, vomiting or diarrhea. Rest of the 10 point ROS is below.   REVIEW OF SYSTEMS:   Constitutional: Negative for appetite change, fatigue, chills, fever and unexpected weight change HENT: Negative for mouth sores, nosebleeds, sore throat and trouble swallowing.   Eyes: Negative for eye problems and icterus.  Respiratory: Negative for cough,  hemoptysis, shortness of breath and wheezing.   Cardiovascular: Negative for chest pain. Gastrointestinal: Negative for abdominal pain, constipation, diarrhea, nausea and vomiting.  Genitourinary: Negative for bladder incontinence, difficulty urinating, dysuria, frequency and hematuria.   Musculoskeletal: Negative for back pain, gait problem, neck pain and neck stiffness.  Skin:Negative for rash and ulcers Neurological: Negative for dizziness, extremity weakness, gait problem, headaches, light-headedness and seizures.  Hematological: Negative for adenopathy. Does not bruise/bleed easily.  Psychiatric/Behavioral: Negative for confusion, depression and sleep disturbance. The patient is not nervous/anxious.    Past Medical History:  Diagnosis Date   Anemia    Anxiety    Asthma    PFO (patent foramen ovale)    surgery may 2022 in [place]   Protein S deficiency [facility])    Pulmonary embolism (HCC) 01/30/2009   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)    Social History   Socioeconomic History   Marital status: Legally Separated    Spouse name: Not on file   Number of children: Not on file   Years of education: Not on file   Highest education level: Not on file  Occupational History   Not on file  Tobacco Use   Smoking status: Every Day    Current packs/day: 0.50    Average packs/day: 0.5 packs/day for 14.0 years (7.0 ttl pk-yrs)    Types: Cigarettes   Smokeless tobacco: Never  Substance and Sexual Activity   Alcohol use: No   Drug use: No   Sexual activity: Not Currently  Other Topics Concern   Not on file  Social History Narrative   Not on file   Social Determinants of Health  Financial Resource Strain: Low Risk  (06/19/2021)   Overall Financial Resource Strain (CARDIA)    Difficulty of Paying Living Expenses: Not hard at all  Food Insecurity: No Food Insecurity (01/30/2023)   Hunger Vital Sign    Worried About Running Out of Food in  the Last Year: Never true    Ran Out of Food in the Last Year: Never true  Transportation Needs: No Transportation Needs (01/30/2023)   PRAPARE - Administrator, Civil Service (Medical): No    Lack of Transportation (Non-Medical): No  Physical Activity: Insufficiently Active (06/19/2021)   Exercise Vital Sign    Days of Exercise per Week: 1 day    Minutes of Exercise per Session: 60 min  Stress: Stress Concern Present (06/19/2021)   [employer] of Occupational Health - Occupational Stress Questionnaire    Feeling of Stress : Rather much  Social Connections: Unknown (11/04/2021)   Received from [facility]   Social Network    Social Network: Not on file   Past Surgical History:  Procedure Laterality Date   CORONARY ULTRASOUND/IVUS Left 08/17/2021   Procedure: Intravascular Ultrasound/IVUS;  Surgeon: Cephus Shelling, MD;  Location: [facility] INVASIVE CV LAB;  Service: Cardiovascular;  Laterality: Left;  Venous   PATENT FORAMEN OVALE(PFO) CLOSURE     PERIPHERAL VASCULAR THROMBECTOMY N/A 08/17/2021   Procedure: PERIPHERAL VASCULAR THROMBECTOMY;  Surgeon: Cephus Shelling, MD;  Location: [facility] INVASIVE CV LAB;  Service: Cardiovascular;  Laterality: N/A;  placement of catheter   PERIPHERAL VASCULAR THROMBECTOMY N/A 08/18/2021   Procedure: LYSIS RECHECK;  Surgeon: Leonie Douglas, MD;  Location: [facility] INVASIVE CV LAB;  Service: Cardiovascular;  Laterality: N/A;   PERIPHERAL VASCULAR THROMBECTOMY N/A 05/14/2022   Procedure: PERIPHERAL VASCULAR THROMBECTOMY;  Surgeon: Maeola Harman, MD;  Location: [facility] INVASIVE CV LAB;  Service: Cardiovascular;  Laterality: N/A;   PERIPHERAL VASCULAR THROMBECTOMY N/A 01/02/2023   Procedure: PERIPHERAL VASCULAR THROMBECTOMY;  Surgeon: Victorino Sparrow, MD;  Location: [facility] INVASIVE CV LAB;  Service: Cardiovascular;  Laterality: N/A;   THROMBECTOMY ILIAC ARTERY Left 01/30/2023   Procedure: PECUTANEOUS ACCESS LEFT FEMORAL VEIN,  PERCUTANEOUS SUCTION  PENUMBRA THROMBECTOMY ILIAC AND COMMON FEMORAL VEIN, BALLOON VENOPLASTY COMMON ILIAC, EXTERNAL ILIAC , COMMONEXTERNAL ILIAC AND FEMORAL VEIN;  Surgeon: Victorino Sparrow, MD;  Location: [facility] OR;  Service: Vascular;  Laterality: Left;   Family History  Problem Relation Age of Onset   Factor V Leiden deficiency Father      ALLERGIES:  is allergic to coconut (cocos nucifera), ivp dye [iodinated contrast media], latex, tessalon [benzonatate], yellow dye, and estrogens.  MEDICATIONS:  Current Outpatient Medications  Medication Sig Dispense Refill   acetaminophen (TYLENOL) 325 MG tablet Take 2 tablets (650 mg total) by mouth every 4 (four) hours as needed for headache or mild pain.     albuterol (VENTOLIN HFA) 108 (90 Base) MCG/ACT inhaler Inhale 2 puffs into the lungs every 6 (six) hours as needed for wheezing or shortness of breath.     ALPRAZolam (XANAX) 0.5 MG tablet Take 0.5 mg by mouth 2 (two) times daily as needed for anxiety or sleep.     levocetirizine (XYZAL) 5 MG tablet Take 5 mg by mouth at bedtime as needed for allergies.     montelukast (SINGULAIR) 10 MG tablet Take 10 mg by mouth daily as needed (allergies).     dabigatran (PRADAXA) 150 MG CAPS capsule Take 1 capsule (150 mg total) by mouth 2 (two) times daily. 60  capsule 6   No current facility-administered medications for this visit.    PHYSICAL EXAMINATION: ECOG PERFORMANCE STATUS: 1 - Symptomatic but completely ambulatory  Vitals:   04/17/23 1000  BP: 101/75  Pulse: 87  Resp: 18  Temp: 98.1 F (36.7 C)  SpO2: 100%    Filed Weights   04/17/23 1000  Weight: 110 lb 6.4 oz (50.1 kg)    Constitutional: Oriented to person, place, and time and well-developed, well-nourished, and in no distress.  HENT:  Head: Normocephalic and atraumatic.  Eyes: Conjunctivae are normal. Right eye exhibits no discharge. Left eye exhibits no discharge. No scleral icterus.  Cardiovascular: Normal rate, regular rhythm,  normal heart sounds Pulmonary/Chest: Effort normal and breath sounds normal. No respiratory distress. No wheezes. No rales.  Musculoskeletal: Normal range of motion. Exhibits no edema.  Neurological: Alert and oriented to person, place, and time. Exhibits normal muscle tone. Gait normal. Coordination normal.  Skin: Skin is warm and dry. No rash noted. Not diaphoretic. No erythema. No pallor.  Psychiatric: Mood, memory and judgment normal.   LABORATORY DATA:  I have reviewed the data as listed    Latest Ref Rng & Units 04/17/2023    9:32 AM 01/30/2023    3:52 PM 01/30/2023    3:33 PM  CMP  Glucose 70 - 99 mg/dL 76     BUN 6 - 20 mg/dL 10     Creatinine 1.61 - 1.00 mg/dL 0.96     Sodium 045 - 409 mmol/L 140  140  140   Potassium 3.5 - 5.1 mmol/L 3.7  4.0  4.0   Chloride 98 - 111 mmol/L 104     CO2 22 - 32 mmol/L 26     Calcium 8.9 - 10.3 mg/dL 9.3     Total Protein 6.5 - 8.1 g/dL 8.2     Total Bilirubin 0.3 - 1.2 mg/dL 0.4     Alkaline Phos 38 - 126 U/L 61     AST 15 - 41 U/L 17     ALT 0 - 44 U/L 14       Lab Results  Component Value Date   WBC 6.9 04/17/2023   HGB 13.2 04/17/2023   HCT 41.5 04/17/2023   MCV 95.4 04/17/2023   PLT 186 04/17/2023   NEUTROABS 4.4 04/17/2023    ASSESSMENT & PLAN:  Amber Carroll is a 37 y.o. female who returns to the clinic for continued management of recurrent DVTs while on indefinite anticoagulation.   #Recurrent VTEs -History of PE and RLL DVT in 2010 after starting oral contraceptive pills. Hypercoagulable workup form 02/08/2009 was unremarkable except for mild protein S deficiency but not reprodcible on repeat. She was treated with coumadin for 6 months.  -Developed bilateral PE and LLE DVT on 08/23/2020. Discharged on Xarelto -Represented with continued LLE pain and numbness March 2022. 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   -She underwent DVT thrombectomy of left common/external iliac vein with angioplasty of left common iliac vein performed by interventional radiology on 08/31/2020. She was found to have PFO on TEE. She was transitioned to Eliquis.  -Underwent Angioplasty/stent from left common iliac vein through left common femoral vein in June 2022 -Patient was hospitalized from 08/15/2021-08/19/2021 due to in-stent stenosis of mid to distal left common iliac vein status post restenting --Hospitalized again from 05/13/2022-05/15/2022  with repeat occlusion of left common and  external iliac vein stents. She was started on IV heparin at admission and her home Eliquis was held. She underwent left pelvic and IVC angiography with mechanical thrombectomy and balloon angioplasty of left common femoral vein, left external iliac vein, and left common iliac vein. Like failure on Eliquis therapy versus stent failure.  --Patient transferred care to Dr. Leonides Schanz and it was recommend to switch to Lovenox bridge and then coumadin but patient was unable to administer the Lovenox injection due to fear of needles. Patient was ultimately switched to Pradaxa 150 mg twice daily in January 2024. --Patient was hospitalized in July 2024 when she held Pradaxa for a few days in anticipation of dental procedure. There was evidence of occlusion of her left iliac caval venous stent. She underwent percutaneous mechanical thrombectomy and balloon venoplasty to the iliac system and common femoral vein.  --On 01/30/2023, she underwent percutaneous mechanical thrombectomy of the left common iliac vein, left external iliac vein, common femoral vein followed by venoplasty of left common iliac vein, external iliac vein, common femoral vein and  femoral vein. PLAN: --Tolerating pradaxa therapy without any prohibitive toxicities including bruising or bleeding --Labs from today were reviewed and adequate to continue therapy. Sent refill today.  --Due to stent  occlusion while off of Pradaxa therapy, patient will need to bridge with Lovenox for any future interruption of Pradaxa for procedures.  --Patient lives in [place] area and is requesting a referral to [facility] Vascular Surgery team. She will continue her care with our hematology team.  --RTC in 6 months with labs   #Iron deficiency anemia 2/2 menstrual bleeding: --Patient did not start ferrous sulfate prescription due to large tablet size --Last received IV venofer 200 mg x 4 doses from 10/26/2022-11/19/2022 at [facility].  --Labs today show no evidence of anemia with Hgb 13.2. Iron panel shows borderline deficiency with ferritin 22, saturation 13%. --Will arrange for IV venofer 300 mg x 1 dose  No orders of the defined types were placed in this encounter.  The patient has a good understanding of the overall plan.   I have spent a total of 30 minutes minutes of face-to-face and non-face-to-face time, preparing to see the patient,  performing a medically appropriate examination, counseling and educating the patient, ordering medications/tests/procedures, documenting clinical information in the electronic health record,and care coordination.   Georga Kaufmann PA-C Dept of Hematology and Oncology [facility] at [facility]

## 2023-04-18 ENCOUNTER — Telehealth: Payer: Self-pay

## 2023-04-18 NOTE — Telephone Encounter (Signed)
Auth Submission: NO AUTH NEEDED Site of care: Site of care: [facility] Payer: Aetna and Medicaid Medication & CPT/J Code(s) submitted: Venofer (Iron Sucrose) J1756 Route of submission (phone, fax, portal):  Phone # Fax # Auth type: Buy/Bill PB Units/visits requested: 300mg  x 1 dose Reference number:  Approval from: 04/18/23 to 07/02/23

## 2023-04-18 NOTE — Telephone Encounter (Signed)
-----   Message from Briant Cedar sent at 04/17/2023  3:29 PM EDT ----- Please notify patient that iron levels are borderline low so we will arrange for IV venofer x 1 dose at [facility].

## 2023-04-18 NOTE — Telephone Encounter (Signed)
Pt advised and IV iron appt made

## 2023-04-29 ENCOUNTER — Ambulatory Visit (INDEPENDENT_AMBULATORY_CARE_PROVIDER_SITE_OTHER): Payer: 59

## 2023-04-29 ENCOUNTER — Emergency Department (HOSPITAL_COMMUNITY)
Admission: EM | Admit: 2023-04-29 | Discharge: 2023-04-29 | Disposition: A | Discharge status: Home / Self Care | Payer: 59 | Attending: Emergency Medicine | Admitting: Emergency Medicine

## 2023-04-29 ENCOUNTER — Other Ambulatory Visit: Payer: Self-pay

## 2023-04-29 VITALS — BP 132/85 | HR 82 | Temp 98.3°F | Resp 18 | Ht 63.5 in | Wt 112.2 lb

## 2023-04-29 DIAGNOSIS — N920 Excessive and frequent menstruation with regular cycle: Secondary | ICD-10-CM | POA: Diagnosis not present

## 2023-04-29 DIAGNOSIS — T7840XA Allergy, unspecified, initial encounter: Secondary | ICD-10-CM | POA: Diagnosis present

## 2023-04-29 DIAGNOSIS — D500 Iron deficiency anemia secondary to blood loss (chronic): Secondary | ICD-10-CM | POA: Diagnosis not present

## 2023-04-29 DIAGNOSIS — T454X5A Adverse effect of iron and its compounds, initial encounter: Secondary | ICD-10-CM | POA: Diagnosis not present

## 2023-04-29 MED ORDER — SODIUM CHLORIDE 0.9% FLUSH: 10.0000 mL | Freq: Once | INTRAVENOUS | Status: DC | PRN

## 2023-04-29 MED ORDER — SODIUM CHLORIDE 0.9 % IV SOLN: Freq: Once | INTRAVENOUS | Status: AC | PRN

## 2023-04-29 MED ORDER — FAMOTIDINE IN NACL 20-0.9 MG/50ML-% IV SOLN
20.0000 mg | Freq: Once | INTRAVENOUS | Status: AC
  Administered 2023-04-29: 20 mg via INTRAVENOUS
  Filled 2023-04-29: qty 50

## 2023-04-29 MED ORDER — LACTATED RINGERS IV BOLUS
500.0000 mL | Freq: Once | INTRAVENOUS | Status: AC
  Administered 2023-04-29: 500 mL via INTRAVENOUS

## 2023-04-29 MED ORDER — ANTICOAGULANT SODIUM CITRATE 4% (200MG/5ML) IV SOLN: 5.0000 mL | Freq: Once | Status: DC | PRN

## 2023-04-29 MED ORDER — ALBUTEROL SULFATE HFA 108 (90 BASE) MCG/ACT IN AERS: 2.0000 | INHALATION_SPRAY | Freq: Once | RESPIRATORY_TRACT | Status: DC | PRN

## 2023-04-29 MED ORDER — IRON SUCROSE 300 MG IVPB - SIMPLE MED
300.0000 mg | Freq: Once | Status: AC
  Administered 2023-04-29: 300 mg via INTRAVENOUS
  Filled 2023-04-29: qty 265

## 2023-04-29 MED ORDER — EPINEPHRINE 0.3 MG/0.3ML IJ SOAJ: 0.3000 mg | Freq: Once | INTRAMUSCULAR | Status: DC | PRN

## 2023-04-29 MED ORDER — PREDNISONE 10 MG PO TABS: 40.0000 mg | ORAL_TABLET | Freq: Every day | ORAL | 0 refills | Status: AC

## 2023-04-29 MED ORDER — SODIUM CHLORIDE 0.9% FLUSH: 3.0000 mL | Freq: Once | INTRAVENOUS | Status: DC | PRN

## 2023-04-29 MED ORDER — DIPHENHYDRAMINE HCL 25 MG PO CAPS: 25.0000 mg | ORAL_CAPSULE | Freq: Once | ORAL | Status: DC

## 2023-04-29 MED ORDER — METHYLPREDNISOLONE SODIUM SUCC 125 MG IJ SOLR
125.0000 mg | Freq: Once | INTRAMUSCULAR | Status: AC | PRN
  Administered 2023-04-29: 125 mg via INTRAVENOUS

## 2023-04-29 MED ORDER — DIPHENHYDRAMINE HCL 50 MG/ML IJ SOLN
50.0000 mg | Freq: Once | INTRAMUSCULAR | Status: AC | PRN
  Administered 2023-04-29: 50 mg via INTRAVENOUS

## 2023-04-29 MED ORDER — FAMOTIDINE IN NACL 20-0.9 MG/50ML-% IV SOLN: 20.0000 mg | Freq: Once | INTRAVENOUS | Status: DC | PRN

## 2023-04-29 MED ORDER — ALTEPLASE 2 MG IJ SOLR: 2.0000 mg | Freq: Once | INTRAMUSCULAR | Status: DC | PRN

## 2023-04-29 MED ORDER — HEPARIN SOD (PORK) LOCK FLUSH 100 UNIT/ML IV SOLN: 500.0000 [IU] | Freq: Once | INTRAVENOUS | Status: DC | PRN

## 2023-04-29 MED ORDER — ACETAMINOPHEN 325 MG PO TABS: 650.0000 mg | ORAL_TABLET | Freq: Once | ORAL | Status: DC

## 2023-04-29 MED ORDER — HEPARIN SOD (PORK) LOCK FLUSH 100 UNIT/ML IV SOLN: 250.0000 [IU] | Freq: Once | INTRAVENOUS | Status: DC | PRN

## 2023-04-29 NOTE — Discharge Instructions (Addendum)
A prescription for prednisone was sent to your pharmacy.  Take for the next 3 days.  Take Benadryl as needed for rash or itchiness.  Return to the emergency department for any new or worsening symptoms of concern.

## 2023-04-29 NOTE — Progress Notes (Signed)
Diagnosis: Iron Deficiency Anemia  Provider:  Chilton Greathouse MD  Procedure: IV Infusion  IV Type: Peripheral, IV Location: R Forearm  Venofer (Iron Sucrose), Dose: 300 mg  Infusion Start Time: 1306  Infusion Stop Time: 1445  1525- Following observation, pt complained of stiffness to right middle and index fingers. Stiffness progressed to numbness and severe pain. New IV started to right ac. Pt began developing hives to bilateral lower extremities. Administered solumedrol 125mg  IVP, as well as benadryl 50mg  IVP. Denies any itching, headache, dizziness, or difficulty breathing. Pt continued to report increasing pain to right hand. Noted hives to upper extremities, and swelling to right upper extremity. 1L NS Bolus started. EMS called. 1555- EMS arrived. Report given. Family notified by patient. Transported to [facility] ED.  Post Infusion IV Care: Observation period completed and Peripheral IV Discontinued  Discharge: Condition: Stable, Destination: ED  . AVS Provided  Performed by:  Loney Hering, LPN

## 2023-04-29 NOTE — ED Triage Notes (Signed)
Patient brought in by EMS from pulmonary office, patient was receiving iron infusion and started breaking out around IV site. She developed hives on legs but no respiratory or GI upset. EMS started 22g in [facility] and administered 50mg  of Benadryl and 125mg  of solu-medrol.  114/68 78 100% 16

## 2023-04-29 NOTE — ED Provider Notes (Signed)
[place] EMERGENCY DEPARTMENT AT [facility] Provider Note   CSN: [id number] Arrival date & time: 04/29/23  1613     History  Chief Complaint  Patient presents with   Allergic Reaction    Amber Carroll is a 37 y.o. female.   Allergic Reaction Presenting symptoms: rash   Patient presents for concern of allergic reaction.  Further prior to arrival, she was undergoing an iron infusion.  At the end of her infusion, she began to experience burning in her feet, red blotchiness on her bilateral lower legs without pruritus, and swelling and stiffness in her right hand.  IV was infusing in right forearm.  She denies any chest tightness, throat swelling, shortness of breath, or nausea at the time.  She did receive prednisone and Benadryl prior to arrival.  Currently, her lower extremity symptoms have resolved.  She continues to feel some stiffness in her right hand.  She denies any other current symptoms.     Home Medications Prior to Admission medications   Medication Sig Start Date End Date Taking? Authorizing Provider  predniSONE (DELTASONE) 10 MG tablet Take 4 tablets (40 mg total) by mouth daily for 3 days. 04/29/23 05/02/23 Yes Gloris Manchester, MD  acetaminophen (TYLENOL) 325 MG tablet Take 2 tablets (650 mg total) by mouth every 4 (four) hours as needed for headache or mild pain. 01/05/23   Lonia Blood, MD  albuterol (VENTOLIN HFA) 108 (90 Base) MCG/ACT inhaler Inhale 2 puffs into the lungs every 6 (six) hours as needed for wheezing or shortness of breath.    [provider]  ALPRAZolam Prudy Feeler) 0.5 MG tablet Take 0.5 mg by mouth 2 (two) times daily as needed for anxiety or sleep.    [provider]  dabigatran (PRADAXA) 150 MG CAPS capsule Take 1 capsule (150 mg total) by mouth 2 (two) times daily. 04/17/23   Briant Cedar, PA-C  levocetirizine (XYZAL) 5 MG tablet Take 5 mg by mouth at bedtime as needed for allergies.    [provider]   montelukast (SINGULAIR) 10 MG tablet Take 10 mg by mouth daily as needed (allergies).    [provider]      Allergies    Coconut (cocos nucifera), Ivp dye [iodinated contrast media], Latex, Tessalon [benzonatate], Yellow dye, and Estrogens    Review of Systems   Review of Systems  Musculoskeletal:  Positive for arthralgias and joint swelling.  Skin:  Positive for rash.  All other systems reviewed and are negative.   Physical Exam Updated Vital Signs BP 110/72 (BP Location: Left Arm)   Pulse 71   Temp 99.5 F (37.5 C) (Oral)   Resp 16   Ht 5\' 3"  (1.6 m)   Wt 51 kg   SpO2 100%   BMI 19.92 kg/m  Physical Exam Vitals and nursing note reviewed.  Constitutional:      General: She is not in acute distress.    Appearance: Normal appearance. She is well-developed. She is not ill-appearing, toxic-appearing or diaphoretic.  HENT:     Head: Normocephalic and atraumatic.     Right Ear: External ear normal.     Left Ear: External ear normal.     Nose: Nose normal.     Mouth/Throat:     Mouth: Mucous membranes are moist.  Eyes:     Extraocular Movements: Extraocular movements intact.     Conjunctiva/sclera: Conjunctivae normal.  Cardiovascular:     Rate and Rhythm: Normal rate and regular rhythm.  Pulmonary:     Effort: Pulmonary effort is normal. No respiratory distress.     Breath sounds: No wheezing or rales.  Abdominal:     General: There is no distension.     Palpations: Abdomen is soft.     Tenderness: There is no abdominal tenderness.  Musculoskeletal:     Cervical back: Normal range of motion and neck supple.     Right lower leg: No edema.     Left lower leg: No edema.     Comments: Mild swelling to right hand.  Skin:    General: Skin is warm and dry.     Findings: No rash.  Neurological:     General: No focal deficit present.     Mental Status: She is alert and oriented to person, place, and time.     Cranial Nerves: No cranial nerve deficit.      Sensory: No sensory deficit.     Motor: No weakness.     Coordination: Coordination normal.  Psychiatric:        Mood and Affect: Mood normal.        Behavior: Behavior normal.        Thought Content: Thought content normal.        Judgment: Judgment normal.     ED Results / Procedures / Treatments   Labs (all labs ordered are listed, but only abnormal results are displayed) Labs Reviewed - No data to display  EKG None  Radiology No results found.  Procedures Procedures    Medications Ordered in ED Medications  famotidine (PEPCID) IVPB 20 mg premix (0 mg Intravenous Stopped 04/29/23 1802)  lactated ringers bolus 500 mL (500 mLs Intravenous New Bag/Given 04/29/23 1719)    ED Course/ Medical Decision Making/ A&P                                 Medical Decision Making Risk Prescription drug management.   Patient presents for concern of allergic reaction.  This was very shortly after receiving an iron transfusion today.  He has received these in the past without issue.  Symptoms at time of reaction include burning sensation in feet, rash on lower legs, and swelling and stiffness to right hand.  She received prednisone and Benadryl prior to arrival.  Her symptoms have resolved other than some swelling and stiffness to right hand, which has improved.  She is well-appearing on exam.  Her breathing is unlabored.  Lungs are clear to auscultation.  She has no abdominal tenderness.  Plan will be for ED observation.  Pepcid was ordered for additional histamine blockade.  On reassessment, patient has no worsening of symptoms.  Right hand swelling and stiffness is improving.  She was prescribed 3 days of prednisone and advised to take Benadryl as needed.  She was discharged in stable condition.        Final Clinical Impression(s) / ED Diagnoses Final diagnoses:  Allergic reaction, initial encounter    Rx / DC Orders ED Discharge Orders          Ordered    predniSONE  (DELTASONE) 10 MG tablet  Daily        04/29/23 Sherryl Manges, MD 04/29/23 1824

## 2023-05-07 ENCOUNTER — Encounter: Payer: Self-pay | Admitting: Nurse Practitioner

## 2023-05-09 ENCOUNTER — Other Ambulatory Visit: Payer: Self-pay | Admitting: Hematology and Oncology

## 2023-05-09 ENCOUNTER — Other Ambulatory Visit: Payer: Self-pay | Admitting: *Deleted

## 2023-05-09 MED ORDER — DABIGATRAN ETEXILATE MESYLATE 150 MG PO CAPS: 150.0000 mg | ORAL_CAPSULE | Freq: Two times a day (BID) | ORAL | 6 refills | Status: DC

## 2023-05-17 ENCOUNTER — Telehealth: Payer: Self-pay

## 2023-05-17 ENCOUNTER — Other Ambulatory Visit: Payer: Self-pay

## 2023-05-17 DIAGNOSIS — D500 Iron deficiency anemia secondary to blood loss (chronic): Secondary | ICD-10-CM

## 2023-05-17 NOTE — Telephone Encounter (Signed)
T/C from pt requesting a lab only appt for fatigue.  Heather, this pt had a reaction to venofer and Lacie said she was not going to order any IV iron since she is not anemic and ferritin is in low-normal range. She was advised to continue her oral iron  . Pt said she is still having fatigue and wants to come in for labs. Is it OK if I make her a lab only appt? CBC CMP Ferritin and iron   yes. sounds good!  HB   Lab only appt made for 11/18 at 2:15  Pt advised

## 2023-05-20 ENCOUNTER — Encounter: Payer: Self-pay | Admitting: *Deleted

## 2023-05-20 ENCOUNTER — Inpatient Hospital Stay: Payer: 59 | Attending: Physician Assistant

## 2023-05-20 DIAGNOSIS — D500 Iron deficiency anemia secondary to blood loss (chronic): Secondary | ICD-10-CM | POA: Insufficient documentation

## 2023-05-20 DIAGNOSIS — Z86718 Personal history of other venous thrombosis and embolism: Secondary | ICD-10-CM | POA: Insufficient documentation

## 2023-05-20 LAB — CBC WITH DIFFERENTIAL (CANCER CENTER ONLY)
Abs Immature Granulocytes: 0.02 10*3/uL (ref 0.00–0.07)
Basophils Absolute: 0 10*3/uL (ref 0.0–0.1)
Basophils Relative: 1 %
Eosinophils Absolute: 0.2 10*3/uL (ref 0.0–0.5)
Eosinophils Relative: 3 %
HCT: 38.3 % (ref 36.0–46.0)
Hemoglobin: 12.9 g/dL (ref 12.0–15.0)
Immature Granulocytes: 0 %
Lymphocytes Relative: 36 %
Lymphs Abs: 2.6 10*3/uL (ref 0.7–4.0)
MCH: 30.4 pg (ref 26.0–34.0)
MCHC: 33.7 g/dL (ref 30.0–36.0)
MCV: 90.3 fL (ref 80.0–100.0)
Monocytes Absolute: 0.6 10*3/uL (ref 0.1–1.0)
Monocytes Relative: 8 %
Neutro Abs: 3.7 10*3/uL (ref 1.7–7.7)
Neutrophils Relative %: 52 %
Platelet Count: 187 10*3/uL (ref 150–400)
RBC: 4.24 MIL/uL (ref 3.87–5.11)
RDW: 14.1 % (ref 11.5–15.5)
WBC Count: 7.1 10*3/uL (ref 4.0–10.5)
nRBC: 0 % (ref 0.0–0.2)

## 2023-05-20 LAB — CMP (CANCER CENTER ONLY)
ALT: 11 U/L (ref 0–44)
AST: 12 U/L — ABNORMAL LOW (ref 15–41)
Albumin: 4.3 g/dL (ref 3.5–5.0)
Alkaline Phosphatase: 60 U/L (ref 38–126)
Anion gap: 6 (ref 5–15)
BUN: 7 mg/dL (ref 6–20)
CO2: 29 mmol/L (ref 22–32)
Calcium: 9.5 mg/dL (ref 8.9–10.3)
Chloride: 103 mmol/L (ref 98–111)
Creatinine: 0.73 mg/dL (ref 0.44–1.00)
GFR, Estimated: >60 mL/min (ref >60)
Glucose, Bld: 98 mg/dL (ref 70–99)
Potassium: 3.5 mmol/L (ref 3.5–5.1)
Sodium: 138 mmol/L (ref 135–145)
Total Bilirubin: 0.4 mg/dL (ref <1.2)
Total Protein: 7.5 g/dL (ref 6.5–8.1)

## 2023-05-20 LAB — IRON AND IRON BINDING CAPACITY (CC-WL,HP ONLY)
Iron: 86 ug/dL (ref 28–170)
Saturation Ratios: 26 % (ref 10.4–31.8)
TIBC: 337 ug/dL (ref 250–450)
UIBC: 251 ug/dL (ref 148–442)

## 2023-05-21 ENCOUNTER — Encounter: Payer: Self-pay | Admitting: Nurse Practitioner

## 2023-05-21 LAB — FERRITIN: Ferritin: 122 ng/mL (ref 11–307)

## 2023-05-27 ENCOUNTER — Encounter: Payer: Self-pay | Admitting: Physician Assistant

## 2023-05-27 ENCOUNTER — Other Ambulatory Visit: Payer: Self-pay | Admitting: *Deleted

## 2023-05-27 ENCOUNTER — Other Ambulatory Visit (HOSPITAL_COMMUNITY): Payer: Self-pay

## 2023-05-27 ENCOUNTER — Telehealth: Payer: Self-pay | Admitting: *Deleted

## 2023-05-27 MED ORDER — DABIGATRAN ETEXILATE MESYLATE 150 MG PO CAPS
150.0000 mg | ORAL_CAPSULE | Freq: Two times a day (BID) | ORAL | 3 refills | Status: AC
  Filled 2023-05-27: qty 180, 90d supply, fill #0
  Filled 2023-08-19: qty 60, 30d supply, fill #1
  Filled 2023-08-26: qty 180, 90d supply, fill #1

## 2023-05-27 NOTE — Telephone Encounter (Signed)
TCT patient regarding her Pradaxa. Pt states she was getting a 3 month supply but current prescription is only for 1 month at a time.. Plus she says that [facility]'t always [facility] it. Advised that we can try getting it filled at [facility] with a 90 day supply.  She is agreeable to try that.  Refill for Pradaxa has been sent to [facility] pharmacy

## 2023-05-27 NOTE — Telephone Encounter (Signed)
Received message to

## 2023-05-28 ENCOUNTER — Other Ambulatory Visit (HOSPITAL_COMMUNITY): Payer: Self-pay

## 2023-06-05 ENCOUNTER — Other Ambulatory Visit: Payer: Self-pay

## 2023-07-17 DIAGNOSIS — Z7901 Long term (current) use of anticoagulants: Secondary | ICD-10-CM | POA: Diagnosis not present

## 2023-07-17 DIAGNOSIS — I82403 Acute embolism and thrombosis of unspecified deep veins of lower extremity, bilateral: Secondary | ICD-10-CM | POA: Diagnosis not present

## 2023-08-19 ENCOUNTER — Other Ambulatory Visit (HOSPITAL_COMMUNITY): Payer: Self-pay

## 2023-08-19 ENCOUNTER — Other Ambulatory Visit: Payer: Self-pay

## 2023-08-19 ENCOUNTER — Encounter: Payer: Self-pay | Admitting: Physician Assistant

## 2023-08-22 ENCOUNTER — Other Ambulatory Visit: Payer: Self-pay

## 2023-08-26 ENCOUNTER — Other Ambulatory Visit (HOSPITAL_COMMUNITY): Payer: Self-pay

## 2023-08-27 ENCOUNTER — Other Ambulatory Visit (HOSPITAL_COMMUNITY): Payer: Self-pay

## 2023-08-27 ENCOUNTER — Encounter: Payer: Self-pay | Admitting: Physician Assistant

## 2023-08-28 ENCOUNTER — Other Ambulatory Visit (HOSPITAL_COMMUNITY): Payer: Self-pay

## 2023-09-16 DIAGNOSIS — Z6820 Body mass index (BMI) 20.0-20.9, adult: Secondary | ICD-10-CM | POA: Diagnosis not present

## 2023-09-16 DIAGNOSIS — J302 Other seasonal allergic rhinitis: Secondary | ICD-10-CM | POA: Diagnosis not present

## 2023-10-09 ENCOUNTER — Encounter: Payer: Self-pay | Admitting: Physician Assistant

## 2023-10-15 ENCOUNTER — Telehealth: Payer: Self-pay | Admitting: Physician Assistant

## 2023-10-16 ENCOUNTER — Inpatient Hospital Stay: Payer: 59 | Admitting: Physician Assistant

## 2023-10-16 ENCOUNTER — Inpatient Hospital Stay: Payer: 59

## 2023-11-26 ENCOUNTER — Other Ambulatory Visit (HOSPITAL_COMMUNITY): Payer: Self-pay

## 2023-11-27 ENCOUNTER — Other Ambulatory Visit (HOSPITAL_COMMUNITY): Payer: Self-pay

## 2023-11-28 ENCOUNTER — Other Ambulatory Visit (HOSPITAL_COMMUNITY): Payer: Self-pay

## 2023-11-28 MED ORDER — DABIGATRAN ETEXILATE MESYLATE 150 MG PO CAPS
150.0000 mg | ORAL_CAPSULE | Freq: Two times a day (BID) | ORAL | 1 refills | Status: AC
  Filled 2023-12-22: qty 180, 90d supply, fill #0

## 2023-12-23 ENCOUNTER — Other Ambulatory Visit (HOSPITAL_COMMUNITY): Payer: Self-pay

## 2023-12-23 ENCOUNTER — Encounter: Payer: Self-pay | Admitting: Physician Assistant

## 2023-12-23 ENCOUNTER — Other Ambulatory Visit: Payer: Self-pay

## 2023-12-26 ENCOUNTER — Other Ambulatory Visit (HOSPITAL_COMMUNITY): Payer: Self-pay

## 2023-12-26 ENCOUNTER — Encounter: Payer: Self-pay | Admitting: Physician Assistant

## 2024-01-06 ENCOUNTER — Other Ambulatory Visit (HOSPITAL_COMMUNITY): Payer: Self-pay

## 2024-01-29 ENCOUNTER — Other Ambulatory Visit: Payer: Self-pay
